# Patient Record
Sex: Female | Born: 1945
Health system: Southern US, Community
[De-identification: ages and names within clinical notes are randomized; demographics above are authoritative.]

## PROBLEM LIST (undated history)

## (undated) DIAGNOSIS — R7303 Prediabetes: Secondary | ICD-10-CM

## (undated) DIAGNOSIS — C779 Secondary and unspecified malignant neoplasm of lymph node, unspecified: Secondary | ICD-10-CM

## (undated) DIAGNOSIS — E559 Vitamin D deficiency, unspecified: Secondary | ICD-10-CM

## (undated) DIAGNOSIS — M8580 Other specified disorders of bone density and structure, unspecified site: Secondary | ICD-10-CM

## (undated) DIAGNOSIS — I10 Essential (primary) hypertension: Secondary | ICD-10-CM

## (undated) DIAGNOSIS — C3490 Malignant neoplasm of unspecified part of unspecified bronchus or lung: Secondary | ICD-10-CM

## (undated) DIAGNOSIS — Z8489 Family history of other specified conditions: Secondary | ICD-10-CM

## (undated) DIAGNOSIS — E785 Hyperlipidemia, unspecified: Secondary | ICD-10-CM

## (undated) DIAGNOSIS — K635 Polyp of colon: Secondary | ICD-10-CM

## (undated) DIAGNOSIS — R0989 Other specified symptoms and signs involving the circulatory and respiratory systems: Secondary | ICD-10-CM

## (undated) DIAGNOSIS — M1990 Unspecified osteoarthritis, unspecified site: Secondary | ICD-10-CM

## (undated) DIAGNOSIS — J449 Chronic obstructive pulmonary disease, unspecified: Secondary | ICD-10-CM

## (undated) DIAGNOSIS — J439 Emphysema, unspecified: Secondary | ICD-10-CM

## (undated) DIAGNOSIS — R079 Chest pain, unspecified: Secondary | ICD-10-CM

## (undated) HISTORY — DX: Other specified symptoms and signs involving the circulatory and respiratory systems: R09.89

## (undated) HISTORY — DX: Prediabetes: R73.03

## (undated) HISTORY — DX: Secondary and unspecified malignant neoplasm of lymph node, unspecified: C77.9

## (undated) HISTORY — DX: Unspecified osteoarthritis, unspecified site: M19.90

## (undated) HISTORY — DX: Vitamin D deficiency, unspecified: E55.9

## (undated) HISTORY — PX: COLONOSCOPY: SHX174

## (undated) HISTORY — PX: HAND SURGERY: SHX662

## (undated) HISTORY — DX: Other specified disorders of bone density and structure, unspecified site: M85.80

## (undated) HISTORY — DX: Essential (primary) hypertension: I10

## (undated) HISTORY — PX: TONSILLECTOMY: SUR1361

## (undated) HISTORY — PX: TRIGGER FINGER RELEASE: SHX641

## (undated) HISTORY — DX: Hyperlipidemia, unspecified: E78.5

## (undated) HISTORY — PX: ESOPHAGOGASTRODUODENOSCOPY: SHX1529

## (undated) HISTORY — DX: Polyp of colon: K63.5

---

## 1993-05-03 HISTORY — PX: OTHER SURGICAL HISTORY: SHX169

## 1998-03-13 ENCOUNTER — Emergency Department (HOSPITAL_COMMUNITY): Admission: EM | Admit: 1998-03-13 | Discharge: 1998-03-13 | Payer: Self-pay | Admitting: Internal Medicine

## 1998-04-17 ENCOUNTER — Emergency Department (HOSPITAL_COMMUNITY): Admission: EM | Admit: 1998-04-17 | Discharge: 1998-04-17 | Payer: Self-pay | Admitting: Emergency Medicine

## 1998-04-26 ENCOUNTER — Emergency Department (HOSPITAL_COMMUNITY): Admission: EM | Admit: 1998-04-26 | Discharge: 1998-04-26 | Payer: Self-pay

## 1998-12-27 ENCOUNTER — Emergency Department (HOSPITAL_COMMUNITY): Admission: EM | Admit: 1998-12-27 | Discharge: 1998-12-27 | Payer: Self-pay | Admitting: Emergency Medicine

## 1999-01-13 ENCOUNTER — Encounter: Admission: RE | Admit: 1999-01-13 | Discharge: 1999-02-05 | Payer: Self-pay | Admitting: *Deleted

## 1999-02-03 ENCOUNTER — Ambulatory Visit (HOSPITAL_BASED_OUTPATIENT_CLINIC_OR_DEPARTMENT_OTHER): Admission: RE | Admit: 1999-02-03 | Discharge: 1999-02-03 | Payer: Self-pay | Admitting: Orthopedic Surgery

## 1999-08-10 ENCOUNTER — Emergency Department (HOSPITAL_COMMUNITY): Admission: EM | Admit: 1999-08-10 | Discharge: 1999-08-10 | Payer: Self-pay | Admitting: *Deleted

## 1999-08-10 ENCOUNTER — Encounter: Payer: Self-pay | Admitting: Emergency Medicine

## 1999-08-25 ENCOUNTER — Ambulatory Visit (HOSPITAL_BASED_OUTPATIENT_CLINIC_OR_DEPARTMENT_OTHER): Admission: RE | Admit: 1999-08-25 | Discharge: 1999-08-25 | Payer: Self-pay | Admitting: Orthopedic Surgery

## 1999-12-02 ENCOUNTER — Encounter: Payer: Self-pay | Admitting: Orthopedic Surgery

## 1999-12-02 ENCOUNTER — Ambulatory Visit (HOSPITAL_COMMUNITY): Admission: RE | Admit: 1999-12-02 | Discharge: 1999-12-02 | Payer: Self-pay | Admitting: Orthopedic Surgery

## 1999-12-23 ENCOUNTER — Encounter: Payer: Self-pay | Admitting: Orthopedic Surgery

## 1999-12-23 ENCOUNTER — Ambulatory Visit (HOSPITAL_COMMUNITY): Admission: RE | Admit: 1999-12-23 | Discharge: 1999-12-23 | Payer: Self-pay | Admitting: Orthopedic Surgery

## 2000-01-07 ENCOUNTER — Ambulatory Visit (HOSPITAL_COMMUNITY): Admission: RE | Admit: 2000-01-07 | Discharge: 2000-01-07 | Payer: Self-pay | Admitting: Orthopedic Surgery

## 2000-01-07 ENCOUNTER — Encounter: Payer: Self-pay | Admitting: Orthopedic Surgery

## 2000-05-03 HISTORY — PX: LAMINECTOMY: SHX219

## 2000-09-07 ENCOUNTER — Ambulatory Visit (HOSPITAL_COMMUNITY): Admission: RE | Admit: 2000-09-07 | Discharge: 2000-09-07 | Payer: Self-pay | Admitting: Internal Medicine

## 2000-09-07 ENCOUNTER — Encounter: Payer: Self-pay | Admitting: Internal Medicine

## 2000-12-13 ENCOUNTER — Encounter: Payer: Self-pay | Admitting: Neurosurgery

## 2000-12-15 ENCOUNTER — Inpatient Hospital Stay (HOSPITAL_COMMUNITY): Admission: RE | Admit: 2000-12-15 | Discharge: 2000-12-16 | Payer: Self-pay | Admitting: Neurosurgery

## 2000-12-15 ENCOUNTER — Encounter: Payer: Self-pay | Admitting: Neurosurgery

## 2001-01-14 ENCOUNTER — Emergency Department (HOSPITAL_COMMUNITY): Admission: EM | Admit: 2001-01-14 | Discharge: 2001-01-14 | Payer: Self-pay | Admitting: *Deleted

## 2001-01-14 ENCOUNTER — Encounter: Payer: Self-pay | Admitting: *Deleted

## 2003-06-09 ENCOUNTER — Emergency Department (HOSPITAL_COMMUNITY): Admission: EM | Admit: 2003-06-09 | Discharge: 2003-06-09 | Payer: Self-pay | Admitting: Emergency Medicine

## 2005-01-07 ENCOUNTER — Ambulatory Visit (HOSPITAL_COMMUNITY): Admission: RE | Admit: 2005-01-07 | Discharge: 2005-01-07 | Payer: Self-pay | Admitting: Internal Medicine

## 2008-04-09 ENCOUNTER — Ambulatory Visit (HOSPITAL_COMMUNITY): Admission: RE | Admit: 2008-04-09 | Discharge: 2008-04-09 | Payer: Self-pay | Admitting: Internal Medicine

## 2010-05-03 HISTORY — PX: NECK SURGERY: SHX720

## 2010-09-09 ENCOUNTER — Other Ambulatory Visit: Payer: Self-pay | Admitting: Neurosurgery

## 2010-09-09 DIAGNOSIS — M542 Cervicalgia: Secondary | ICD-10-CM

## 2010-09-16 ENCOUNTER — Ambulatory Visit
Admission: RE | Admit: 2010-09-16 | Discharge: 2010-09-16 | Disposition: A | Discharge status: Home / Self Care | Payer: Medicare Other | Source: Ambulatory Visit | Attending: Neurosurgery | Admitting: Neurosurgery

## 2010-09-16 DIAGNOSIS — M542 Cervicalgia: Secondary | ICD-10-CM

## 2010-09-18 NOTE — Op Note (Signed)
[place]. [facility]  Patient:    Stacy [name], Stacy [name]                     MRN: [id number] Proc. Date: 12/15/00 Adm. Date:  [id number] Attending:  Barton Fanny                           Operative Report  PREOPERATIVE DIAGNOSIS:  Right L5-S1 extraforaminal disk herniation.  POSTOPERATIVE DIAGNOSIS:  Right L5-S1 extraforaminal disk herniation.  OPERATION PERFORMED:  Right L5-S1 extraforaminal microdiskectomy with microdissection.  SURGEON:  Hewitt Shorts, M.D.  ASSISTANT:  Mena Goes. Franky Macho, M.D.  ANESTHESIA:  General endotracheal.  INDICATIONS FOR PROCEDURE:  The patient is a 65 year old woman who presented with a right L5 radiculopathy who was found to have a right L5-S1 extraforaminal disk herniation.  Decision was made to proceed with elective extraforaminal microdiskectomy.  DESCRIPTION OF PROCEDURE:  The patient was brought to the operating room and placed under general endotracheal anesthesia.  The patient was turned to a prone position.  The lumbar region was prepped with Betadine soap and solution and draped in a sterile fashion.  Localizing x-ray was taken and the L5-S1 level identified.  A midline incision was made over the L5-S1 level and carried down through subcutaneous tissues.  Bipolar cautery and electrocautery were used to maintain hemostasis.  Dissection was carried down to the lumbar fascia which was incised on the right side of midline and the paraspinal muscles were dissected from the spinous process of the lamina in the subperiosteal fashion.  Dissection was carried out laterally.  The L5-S1 interlaminar space was identified and x-ray was taken to confirm the localization.  We then dissected laterally over the facet joints identifying the ala of the sacrum and the L5 transverse process.  Using a Black Max drill and Kerrison punches, the superior aspect of the ala and the lateral aspect of the facet was removed.   Dissection was carried down into the intertransverse space and careful dissection was carried out.  We were able to eventually expose the right L5 nerve root.  We then dissected inferomedial to that and we were able to identify the L5-S1 disk.  The disk herniation was compressing the right L5 nerve and the remaining annular fibers were incised and diskectomy performed using a variety of pituitary rongeurs.  In the end good decompression of the right L5 nerve root was achieved and all loose fragments of disk material were removed.  Once the diskectomy was completed, we instilled 80 mg of Depo-Medrol in and around the nerve root in the dorsal root ganglion and then proceeded with closure.  The deep fascia was closed with interrupted undyed Vicryl sutures and the subcutaneous and subcuticular layer were closed with interrupted inverted 2-0 undyed Vicryl sutures and the skin was reapproximated with Dermabond.  The patient tolerated the procedure well. Estimated blood loss for this procedure was 50 cc.  Sponge, needle and instrument counts were correct.  Following surgery the patient was turned back to supine position, reversed from  anesthetic, extubated and transferred to the recovery room for further care. DD:  12/15/00 TD:  12/15/00 Job: [id number]
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
## 2010-09-18 NOTE — Op Note (Signed)
[place]. [facility]  Patient:    Stacy [name], Stacy [name]                     MRN: [id number] Proc. Date: 08/25/99 Adm. Date:  [id number] Attending:  Sypher, Douglass Rivers CC:         Katy Fitch. Naaman Plummer., M.D. x 2             Marinus Maw, M.D.                           Operative Report  PREOPERATIVE DIAGNOSIS:  Locked right trigger thumb.  POSTOPERATIVE DIAGNOSIS:  Locked right trigger thumb.  OPERATION:  Release of right thumb A1 pulley.  SURGEON:  Katy Fitch. Sypher, Montez Hageman., M.D.  ASSISTANT:  None.  ANESTHESIA:  2% lidocaine metacarpal head level block right thumb, supplemented by IV sedation.  SUPERVISING ANESTHESIOLOGIST:  Dr. Krista Blue.  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  She has had a history of stenosing tenosynovitis in the past.  She developed an acute stenosing tenosynovitis of her right thumb with locking.  This has been unresponsive to splinting and activity modification. She requested primary release rather than injection therapy.  After informed consent, she was brought to the operating room at this time for release of her A1 pulley.  DESCRIPTION OF PROCEDURE:  Stacy [name] was brought to the operating room and  placed in supine position on the operating table.  Following light sedation, the right arm was prepped with Betadine soap and solution and sterilely draped. Then, 2% lidocaine was infiltrated at the metacarpal head level to obtain block to the radial and ulnar proper digital nerves, as well as, the flexor sheath.  When the anesthesia was satisfactory, the arm was exsanguinated with Esmarch bandage and  arterial tourniquet inflated to 220 mmHg.  Procedure commenced with a short transverse incision directly over the A1 pulley.  Subcutaneous tissues were carefully divided revealing the radial proper digital nerve.  This was gently retracted.  The pulley  was carefully isolated with a Freer and split with scalpel and scissors.  The tendon was delivered and had minor inflammatory changes. Direct full active range of motion of the IP joint was recovered.  The wound was then repaired with mattress sutures of 5-0 nylon.  A compressive dressing was applied with Xeroflo, sterile gauze and Coban.  There were no apparent complications.  Ms. Qazi tolerated the surgery and anesthesia well.  She was transferred to the recovery room with stable vital signs. DD:  08/25/99 TD:  08/25/99 Job: [id number]

## 2010-10-07 ENCOUNTER — Inpatient Hospital Stay (HOSPITAL_COMMUNITY)
Admission: RE | Admit: 2010-10-07 | Discharge: 2010-10-08 | DRG: 473 | Disposition: A | Discharge status: Home / Self Care | Payer: Medicare Other | Source: Ambulatory Visit | Attending: Neurosurgery | Admitting: Neurosurgery

## 2010-10-07 ENCOUNTER — Inpatient Hospital Stay (HOSPITAL_COMMUNITY): Payer: Medicare Other

## 2010-10-07 DIAGNOSIS — F411 Generalized anxiety disorder: Secondary | ICD-10-CM | POA: Diagnosis present

## 2010-10-07 DIAGNOSIS — M5000 Cervical disc disorder with myelopathy, unspecified cervical region: Principal | ICD-10-CM | POA: Diagnosis present

## 2010-10-07 DIAGNOSIS — E7800 Pure hypercholesterolemia, unspecified: Secondary | ICD-10-CM | POA: Diagnosis present

## 2010-10-07 DIAGNOSIS — I10 Essential (primary) hypertension: Secondary | ICD-10-CM | POA: Diagnosis present

## 2010-10-07 LAB — BASIC METABOLIC PANEL
BUN: 11 mg/dL (ref 6–23)
CO2: 29 mEq/L (ref 19–32)
Calcium: 10.1 mg/dL (ref 8.4–10.5)
Chloride: 101 mEq/L (ref 96–112)
Creatinine, Ser: 0.86 mg/dL (ref 0.4–1.2)
GFR calc Af Amer: >60 mL/min (ref >60)
GFR calc non Af Amer: >60 mL/min (ref >60)
Glucose, Bld: 125 mg/dL — ABNORMAL HIGH (ref 70–99)
Potassium: 4.5 mEq/L (ref 3.5–5.1)
Sodium: 139 mEq/L (ref 135–145)

## 2010-10-07 LAB — CBC
HCT: 42.4 % (ref 36.0–46.0)
Hemoglobin: 14.7 g/dL (ref 12.0–15.0)
MCH: 33.6 pg (ref 26.0–34.0)
MCHC: 34.7 g/dL (ref 30.0–36.0)
MCV: 96.8 fL (ref 78.0–100.0)
Platelets: 197 10*3/uL (ref 150–400)
RBC: 4.38 MIL/uL (ref 3.87–5.11)
RDW: 12.2 % (ref 11.5–15.5)
WBC: 5.6 10*3/uL (ref 4.0–10.5)

## 2010-10-07 LAB — DIFFERENTIAL
Basophils Absolute: 0 10*3/uL (ref 0.0–0.1)
Basophils Relative: 1 % (ref 0–1)
Eosinophils Absolute: 0.3 10*3/uL (ref 0.0–0.7)
Eosinophils Relative: 5 % (ref 0–5)
Lymphocytes Relative: 37 % (ref 12–46)
Lymphs Abs: 2.1 10*3/uL (ref 0.7–4.0)
Monocytes Absolute: 0.4 10*3/uL (ref 0.1–1.0)
Monocytes Relative: 6 % (ref 3–12)
Neutro Abs: 2.9 10*3/uL (ref 1.7–7.7)
Neutrophils Relative %: 51 % (ref 43–77)

## 2010-10-07 LAB — SURGICAL PCR SCREEN
MRSA, PCR: NEGATIVE
Staphylococcus aureus: NEGATIVE

## 2010-10-14 NOTE — Op Note (Signed)
NAMELYNDZEE, KLIEBERT NO.:  [id number]  MEDICAL RECORD NO.:  [id number]  LOCATION:  [id number]                         FACILITY:  [facility]  PHYSICIAN:  Hewitt Shorts, M.D.DATE OF BIRTH:  09-08-1945  DATE OF PROCEDURE:  10/07/2010 DATE OF DISCHARGE:                              OPERATIVE REPORT   PREOPERATIVE DIAGNOSIS:  T5-6 cervical disk herniation with myelopathy, C5-6 cervical spondylosis with myelopathy, cervical stenosis and cervical radiculopathy.  POSTOPERATIVE DIAGNOSIS:  T5-6 cervical disk herniation with myelopathy, C5-6 cervical spondylosis with myelopathy, cervical stenosis and cervical radiculopathy.  PROCEDURE:  C5-6 anterior cervical decompression and arthrodesis with allograft and tether cervical plating.  SURGEON:  Hewitt Shorts, MD  ASSISTANT:  Stefani Dama, MD.  ANESTHESIA:  General endotracheal.  INDICATIONS:  The patient is a 65 year old woman who presented with neckpain and left upper extremity pain and weakness, particularly in the grip and intrinsics.  X-ray showed multilevel degenerative disease and spondylosis worst at the C5-6 level with large broad-based spondylitic disk herniation at the C5-6 levels, significant spinal cord encroachment.  This increased signal in the spinal cord behind the body of C6.  Decision made to proceed with single-level anterior cervical decompression and arthrodesis.  DESCRIPTION OF PROCEDURE:  The patient was brought to the operating room and placed under general endotracheal anesthesia.  The patient was placed in 10 pounds of holder traction.  Neck was prepped with Betadine soap and solution and draped in a sterile fashion.  A horizontal incision was made on the left side of neck.  The line of the incision was infiltrated with local anesthetic of epinephrine.  Incision was made and carried down through subcutaneous tissue and platysma.  Bipolar was used to maintain hemostasis.  Dissection  was carried down to the platysma and then through an avascular plane leaving the sternocleidomastoid, carotid artery, and jugular vein laterally, and the trachea and esophagus medially.  The ventral aspect of the vertebral column was identified and  localizing x-rays were taken, and the C5-6 intervertebral disk space was identified.  Diskectomy was begun with incision of the annulus, continued with micro-curettes and pituitary rongeurs.  Anterior osteophytic overgrowth was removed using osteophyte removal tool and then the operating microscope was draped and brought into the field for additional navigation, illumination, and visualization.  The remainder of decompression was performed using microdissection and microsurgical technique.  The cartilaginous endplates of the vertebral bodies removed using micro-curettes along with high-speed drill and then posterior osteophytic overgrowth was removed using the high-speed drill along with 2-mm Kerrison punch with a thin footplate.  As this was done, we began to remove the posterior longitudinal ligament as well and removed the disk herniation.  We found significant amount of spondylotic disk herniation causing thecal sac and nerve root compression.  There was osteophytic overgrowth encroaching on the neural foramina, particular to the left side, and this was removed as well.  We were able to decompress the neural foramina bilaterally and the exiting nerve roots within this.  Once the decompression was complete, hemostasis was established use Gelfoam soaked in thrombin.  We measured the height of the intervertebral disk space and selected a 6-mm allograft implant.  It was positioned in the intervertebral disk space and countersunk.  We then selected a 12-mm tether cervical plate.  It was positioned over the fusion construct and secured to the vertebra with 4 x 13-mm screws.  We placed a pair of fixed screws at C5 and a pair of variable screws at  C6.  X-ray at the end showed good positioning of the graft plate and screws.  Wound was irrigated with bacitracin solution, checked for hemostasis which was established and confirmed. We then proceed with closure.  Platysma was closed with interrupted inverted 2-0 Vicryl sutures.  Subcutaneous tissues were closed with 3-0 Vicryl sutures.  Skin was closed with Dermabond.  Procedure was tolerated well.  The estimated blood loss was 25 mL.  Sponge count was correct.  Following surgery, the patient the patient was taken out of traction, to be reversed from the anesthetic, extubated, and transferred to recovery room for further care.     Hewitt Shorts, M.D.     RWN/MEDQ  D:  10/07/2010  T:  10/08/2010  Job:  [id number]  Electronically Signed by Shirlean Kelly M.D. on 10/14/2010 10:43:41 AM

## 2011-06-01 DIAGNOSIS — M4712 Other spondylosis with myelopathy, cervical region: Secondary | ICD-10-CM | POA: Diagnosis not present

## 2011-06-01 DIAGNOSIS — M542 Cervicalgia: Secondary | ICD-10-CM | POA: Diagnosis not present

## 2011-06-01 DIAGNOSIS — M5000 Cervical disc disorder with myelopathy, unspecified cervical region: Secondary | ICD-10-CM | POA: Diagnosis not present

## 2011-06-01 DIAGNOSIS — M5412 Radiculopathy, cervical region: Secondary | ICD-10-CM | POA: Diagnosis not present

## 2011-06-01 DIAGNOSIS — M4802 Spinal stenosis, cervical region: Secondary | ICD-10-CM | POA: Diagnosis not present

## 2011-07-23 DIAGNOSIS — M109 Gout, unspecified: Secondary | ICD-10-CM | POA: Diagnosis not present

## 2011-07-23 DIAGNOSIS — Z1212 Encounter for screening for malignant neoplasm of rectum: Secondary | ICD-10-CM | POA: Diagnosis not present

## 2011-07-23 DIAGNOSIS — E782 Mixed hyperlipidemia: Secondary | ICD-10-CM | POA: Diagnosis not present

## 2011-07-23 DIAGNOSIS — R7309 Other abnormal glucose: Secondary | ICD-10-CM | POA: Diagnosis not present

## 2011-07-23 DIAGNOSIS — E119 Type 2 diabetes mellitus without complications: Secondary | ICD-10-CM | POA: Diagnosis not present

## 2011-07-23 DIAGNOSIS — I10 Essential (primary) hypertension: Secondary | ICD-10-CM | POA: Diagnosis not present

## 2011-07-23 DIAGNOSIS — E559 Vitamin D deficiency, unspecified: Secondary | ICD-10-CM | POA: Diagnosis not present

## 2011-07-23 DIAGNOSIS — Z79899 Other long term (current) drug therapy: Secondary | ICD-10-CM | POA: Diagnosis not present

## 2011-07-29 DIAGNOSIS — N3000 Acute cystitis without hematuria: Secondary | ICD-10-CM | POA: Diagnosis not present

## 2011-10-14 DIAGNOSIS — H1030 Unspecified acute conjunctivitis, unspecified eye: Secondary | ICD-10-CM | POA: Diagnosis not present

## 2012-08-21 DIAGNOSIS — Z23 Encounter for immunization: Secondary | ICD-10-CM | POA: Diagnosis not present

## 2012-08-21 DIAGNOSIS — E782 Mixed hyperlipidemia: Secondary | ICD-10-CM | POA: Diagnosis not present

## 2012-08-21 DIAGNOSIS — R5383 Other fatigue: Secondary | ICD-10-CM | POA: Diagnosis not present

## 2012-08-21 DIAGNOSIS — M79609 Pain in unspecified limb: Secondary | ICD-10-CM | POA: Diagnosis not present

## 2012-08-21 DIAGNOSIS — Z1212 Encounter for screening for malignant neoplasm of rectum: Secondary | ICD-10-CM | POA: Diagnosis not present

## 2012-08-21 DIAGNOSIS — D649 Anemia, unspecified: Secondary | ICD-10-CM | POA: Diagnosis not present

## 2012-08-21 DIAGNOSIS — E538 Deficiency of other specified B group vitamins: Secondary | ICD-10-CM | POA: Diagnosis not present

## 2012-08-21 DIAGNOSIS — R350 Frequency of micturition: Secondary | ICD-10-CM | POA: Diagnosis not present

## 2012-08-21 DIAGNOSIS — R5381 Other malaise: Secondary | ICD-10-CM | POA: Diagnosis not present

## 2012-08-21 DIAGNOSIS — R7309 Other abnormal glucose: Secondary | ICD-10-CM | POA: Diagnosis not present

## 2012-08-21 DIAGNOSIS — I10 Essential (primary) hypertension: Secondary | ICD-10-CM | POA: Diagnosis not present

## 2012-08-23 ENCOUNTER — Other Ambulatory Visit: Payer: Self-pay | Admitting: Internal Medicine

## 2012-08-23 DIAGNOSIS — Z1231 Encounter for screening mammogram for malignant neoplasm of breast: Secondary | ICD-10-CM

## 2012-08-23 DIAGNOSIS — M8580 Other specified disorders of bone density and structure, unspecified site: Secondary | ICD-10-CM

## 2012-08-28 DIAGNOSIS — H2510 Age-related nuclear cataract, unspecified eye: Secondary | ICD-10-CM | POA: Diagnosis not present

## 2012-09-26 ENCOUNTER — Encounter: Payer: Self-pay | Admitting: Internal Medicine

## 2012-09-26 ENCOUNTER — Ambulatory Visit (INDEPENDENT_AMBULATORY_CARE_PROVIDER_SITE_OTHER): Payer: Medicare Other | Admitting: Internal Medicine

## 2012-09-26 VITALS — BP 112/62 | HR 72 | Ht 61.0 in | Wt 133.0 lb

## 2012-09-26 DIAGNOSIS — E785 Hyperlipidemia, unspecified: Secondary | ICD-10-CM

## 2012-09-26 NOTE — Patient Instructions (Addendum)
Your physician has requested that you have a carotid duplex in two weeks. This test is an ultrasound of the carotid arteries in your neck. It looks at blood flow through these arteries that supply the brain with blood. Allow one hour for this exam. There are no restrictions or special instructions.  FASTING LABS IN 2 WEEKS:  Lipids/AST  Your physician wants you to follow-up in: 1 year with Dr. Tenny Craw. You will receive a reminder letter in the mail two months in advance. If you don't receive a letter, please call our office to schedule the follow-up appointment.

## 2012-09-26 NOTE — Progress Notes (Signed)
HPI Patient is a 67 yo with history of hyperlipidemia, HTN and carotid bruit. The patient has no known history of CAD  Denies CP with activity  Does get some chest tightness with emotional stress but says she has had this for years. She has been treated for HTN  BP goes up with stress.   She has been on statins in past for hyperlipidemia.  Has had to stop at times because of muscle aches. Now on lipitor 40 qd  Recent lipids LDL was 201.  She says she was put on Zetia in past and taken off for unknown reasons SHe is active  No signif SOB with activity.  Works as Engineer, civil (consulting) at [employer]. Allergies  Allergen Reactions  . Codeine   . Crestor (Rosuvastatin)   . Fentanyl   . Hydrocodeine (Dihydrocodeine)   . Penicillins   . Percocet (Oxycodone-Acetaminophen)     Current Outpatient Prescriptions  Medication Sig Dispense Refill  . aspirin 81 MG tablet Take 81 mg by mouth daily.      Marland Kitchen atenolol (TENORMIN) 50 MG tablet Take 1/2 tab daily      . atorvastatin (LIPITOR) 80 MG tablet Take 1/2 tab daily      . cholecalciferol (VITAMIN D) 1000 UNITS tablet Take 4,000 Units by mouth daily.      . vitamin C (ASCORBIC ACID) 500 MG tablet Take 500 mg by mouth daily.      . Vitamin E 200 UNITS TABS Take by mouth.       No current facility-administered medications for this visit.    Past Medical History  Diagnosis Date  . Hypertension   . Osteopenia   . Colon polyps     with divertirticulitis  . Hyperlipidemia     Past Surgical History  Procedure Laterality Date  . Neck surgery  2012     c4 -5/6  fushion  . Laminectomy  2002    L 5   . Hand surgery    . Trigger finger release    . Head injury  1995    Family History  Problem Relation Age of Onset  . Hyperlipidemia Mother   . Heart disease Mother     7 way bypass/ value replacement/pacemaker  . Heart attack Mother 81  . Diabetes Mother   . Cancer - Other Mother     lung  . Hypertension Father   . Heart attack Father 65  . Cancer  - Other Father     throat    History   Social History  . Marital Status: Divorced    Spouse Name: N/A    Number of Children: N/A  . Years of Education: N/A   Occupational History  . Not on file.   Social History Main Topics  . Smoking status: Current Every Day Smoker  . Smokeless tobacco: Not on file     Comment: 1/2 pack daily  . Alcohol Use: Not on file  . Drug Use: Not on file  . Sexually Active: Not on file   Other Topics Concern  . Not on file   Social History Narrative   Divorced- 3children /2 girls and 1/boy--RN ( works as an Environmental education officer at [employer])    Review of Systems:  All systems reviewed.  They are negative to the above problem except as previously stated.  Vital Signs: BP 112/62  Pulse 72  Ht 5\' 1"  (1.549 m)  Wt 133 lb (60.328 kg)  BMI 25.14 kg/m2  Physical Exam Patient is in NAD HEENT:  Normocephalic, atraumatic. EOMI, PERRLA.  Neck: JVP is normal.  L carotid bruit.  Lungs: clear to auscultation. No rales no wheezes.  Heart: Regular rate and rhythm. Normal S1, S2. No S3.   No significant murmurs. PMI not displaced.  Abdomen:  Supple, nontender. Normal bowel sounds. No masses. No hepatomegaly.  Extremities:   Good distal pulses throughout. No lower extremity edema.  Musculoskeletal :moving all extremities.  Neuro:   alert and oriented x3.  CN II-XII grossly intact.   Assessment and Plan:  1.  HTN  Good control  Keep on same regimen  2.  HL  Type I familial hyperlipidemia.  Would check lipid panel in a few wks to see response to increase in lipitor from 40 qod to qd. Denies aches  Taking Vit D which may help. 3.  L carotid bruit  Schedule USN of carotid  4.  Tobacco  Counselled on cessation.

## 2012-10-02 ENCOUNTER — Ambulatory Visit: Payer: Medicare Other

## 2012-10-02 ENCOUNTER — Other Ambulatory Visit: Payer: Medicare Other

## 2012-10-09 ENCOUNTER — Encounter (INDEPENDENT_AMBULATORY_CARE_PROVIDER_SITE_OTHER): Payer: Medicare Other

## 2012-10-09 ENCOUNTER — Other Ambulatory Visit (INDEPENDENT_AMBULATORY_CARE_PROVIDER_SITE_OTHER): Payer: Medicare Other

## 2012-10-09 DIAGNOSIS — E785 Hyperlipidemia, unspecified: Secondary | ICD-10-CM | POA: Diagnosis not present

## 2012-10-09 DIAGNOSIS — R0989 Other specified symptoms and signs involving the circulatory and respiratory systems: Secondary | ICD-10-CM | POA: Diagnosis not present

## 2012-10-09 DIAGNOSIS — I6529 Occlusion and stenosis of unspecified carotid artery: Secondary | ICD-10-CM

## 2012-10-09 LAB — LIPID PANEL
HDL: 54.2 mg/dL (ref >39.00)
LDL Cholesterol: 62 mg/dL (ref 0–99)
Total CHOL/HDL Ratio: 3
VLDL: 31.8 mg/dL (ref 0.0–40.0)

## 2012-10-09 LAB — AST: AST: 21 U/L (ref 0–37)

## 2012-10-17 DIAGNOSIS — R51 Headache: Secondary | ICD-10-CM | POA: Diagnosis not present

## 2012-10-17 DIAGNOSIS — E782 Mixed hyperlipidemia: Secondary | ICD-10-CM | POA: Diagnosis not present

## 2012-10-17 DIAGNOSIS — I10 Essential (primary) hypertension: Secondary | ICD-10-CM | POA: Diagnosis not present

## 2012-10-17 DIAGNOSIS — Z79899 Other long term (current) drug therapy: Secondary | ICD-10-CM | POA: Diagnosis not present

## 2012-10-17 DIAGNOSIS — R7309 Other abnormal glucose: Secondary | ICD-10-CM | POA: Diagnosis not present

## 2012-11-18 DIAGNOSIS — T6391XA Toxic effect of contact with unspecified venomous animal, accidental (unintentional), initial encounter: Secondary | ICD-10-CM | POA: Diagnosis not present

## 2012-11-18 DIAGNOSIS — T7840XA Allergy, unspecified, initial encounter: Secondary | ICD-10-CM | POA: Diagnosis not present

## 2012-12-19 DIAGNOSIS — E782 Mixed hyperlipidemia: Secondary | ICD-10-CM | POA: Diagnosis not present

## 2012-12-19 DIAGNOSIS — I10 Essential (primary) hypertension: Secondary | ICD-10-CM | POA: Diagnosis not present

## 2012-12-19 DIAGNOSIS — E559 Vitamin D deficiency, unspecified: Secondary | ICD-10-CM | POA: Diagnosis not present

## 2013-01-03 DIAGNOSIS — G5600 Carpal tunnel syndrome, unspecified upper limb: Secondary | ICD-10-CM | POA: Diagnosis not present

## 2013-01-10 DIAGNOSIS — J0410 Acute tracheitis without obstruction: Secondary | ICD-10-CM | POA: Diagnosis not present

## 2013-01-10 DIAGNOSIS — J45909 Unspecified asthma, uncomplicated: Secondary | ICD-10-CM | POA: Diagnosis not present

## 2013-02-20 DIAGNOSIS — E782 Mixed hyperlipidemia: Secondary | ICD-10-CM | POA: Diagnosis not present

## 2013-02-20 DIAGNOSIS — E559 Vitamin D deficiency, unspecified: Secondary | ICD-10-CM | POA: Diagnosis not present

## 2013-02-20 DIAGNOSIS — Z79899 Other long term (current) drug therapy: Secondary | ICD-10-CM | POA: Diagnosis not present

## 2013-02-20 DIAGNOSIS — I10 Essential (primary) hypertension: Secondary | ICD-10-CM | POA: Diagnosis not present

## 2013-02-20 DIAGNOSIS — R7309 Other abnormal glucose: Secondary | ICD-10-CM | POA: Diagnosis not present

## 2013-03-28 ENCOUNTER — Other Ambulatory Visit: Payer: Self-pay | Admitting: Internal Medicine

## 2013-03-28 DIAGNOSIS — I10 Essential (primary) hypertension: Secondary | ICD-10-CM

## 2013-03-28 MED ORDER — ATENOLOL 50 MG PO TABS: ORAL_TABLET | ORAL | Status: DC

## 2013-05-23 DIAGNOSIS — E785 Hyperlipidemia, unspecified: Secondary | ICD-10-CM

## 2013-05-23 DIAGNOSIS — I10 Essential (primary) hypertension: Secondary | ICD-10-CM | POA: Insufficient documentation

## 2013-05-23 DIAGNOSIS — M1990 Unspecified osteoarthritis, unspecified site: Secondary | ICD-10-CM | POA: Insufficient documentation

## 2013-05-23 DIAGNOSIS — E782 Mixed hyperlipidemia: Secondary | ICD-10-CM | POA: Insufficient documentation

## 2013-05-23 DIAGNOSIS — R7303 Prediabetes: Secondary | ICD-10-CM | POA: Insufficient documentation

## 2013-05-23 DIAGNOSIS — E559 Vitamin D deficiency, unspecified: Secondary | ICD-10-CM | POA: Insufficient documentation

## 2013-05-24 ENCOUNTER — Ambulatory Visit (INDEPENDENT_AMBULATORY_CARE_PROVIDER_SITE_OTHER): Payer: Medicare Other | Admitting: Internal Medicine

## 2013-05-24 ENCOUNTER — Encounter: Payer: Self-pay | Admitting: Internal Medicine

## 2013-05-24 ENCOUNTER — Other Ambulatory Visit: Payer: Self-pay | Admitting: Internal Medicine

## 2013-05-24 VITALS — BP 120/70 | HR 68 | Temp 97.5°F | Resp 16 | Wt 129.6 lb

## 2013-05-24 DIAGNOSIS — E782 Mixed hyperlipidemia: Secondary | ICD-10-CM | POA: Diagnosis not present

## 2013-05-24 DIAGNOSIS — R7309 Other abnormal glucose: Secondary | ICD-10-CM

## 2013-05-24 DIAGNOSIS — Z79899 Other long term (current) drug therapy: Secondary | ICD-10-CM

## 2013-05-24 DIAGNOSIS — I10 Essential (primary) hypertension: Secondary | ICD-10-CM

## 2013-05-24 DIAGNOSIS — E559 Vitamin D deficiency, unspecified: Secondary | ICD-10-CM | POA: Diagnosis not present

## 2013-05-24 LAB — CBC WITH DIFFERENTIAL/PLATELET
Basophils Absolute: 0 10*3/uL (ref 0.0–0.1)
Basophils Relative: 0 % (ref 0–1)
EOS ABS: 0.1 10*3/uL (ref 0.0–0.7)
EOS PCT: 2 % (ref 0–5)
HCT: 42.4 % (ref 36.0–46.0)
HEMOGLOBIN: 14.5 g/dL (ref 12.0–15.0)
LYMPHS ABS: 2.7 10*3/uL (ref 0.7–4.0)
LYMPHS PCT: 34 % (ref 12–46)
MCH: 32.7 pg (ref 26.0–34.0)
MCHC: 34.2 g/dL (ref 30.0–36.0)
MCV: 95.7 fL (ref 78.0–100.0)
MONOS PCT: 6 % (ref 3–12)
Monocytes Absolute: 0.5 10*3/uL (ref 0.1–1.0)
NEUTROS PCT: 58 % (ref 43–77)
Neutro Abs: 4.5 10*3/uL (ref 1.7–7.7)
Platelets: 217 10*3/uL (ref 150–400)
RBC: 4.43 MIL/uL (ref 3.87–5.11)
RDW: 13.4 % (ref 11.5–15.5)
WBC: 7.9 10*3/uL (ref 4.0–10.5)

## 2013-05-24 NOTE — Progress Notes (Signed)
Patient ID: Stacy [name], female   DOB: 1946-02-08, 68 y.o.   MRN: [id number]   This very nice 68 y.o. DWF presents for 3 month follow up with Hypertension, Hyperlipidemia, Pre-Diabetes and Vitamin D Deficiency.    HTN predates since 1995. BP has been controlled at home. Today's BP: 120/70 mmHg . Patient denies any cardiac type chest pain, palpitations, dyspnea/orthopnea/PND, dizziness, claudication, or dependent edema.   Hyperlipidemia is controlled with diet & meds. Last Cholesterol was 258, Triglycerides were 281, HDL 65 and LDL 137 - not controlled on Zetia alone ( Hx/o intolerance to Crestor)). Patient denies myalgias or other med SE's.    Also, the patient has history of PreDiabetes with A1c 6.4 %  Since Feb 2011 with last A1c of 6.2 % in Oct 2014. Patient denies any symptoms of reactive hypoglycemia, diabetic polys, paresthesias or visual blurring.   Further, Patient has history of Vitamin D Deficiency of 7 in 2008 with last vitamin D of 60 in Oct 2014. Patient supplements vitamin D without any suspected side-effects.    Medication List         aspirin 81 MG tablet  Take 81 mg by mouth daily.     atenolol 50 MG tablet  Commonly known as:  TENORMIN  1/2 to 1 tab qd for BP as directed     atorvastatin 80 MG tablet  Commonly known as:  LIPITOR  Take 1/2 tab daily     cholecalciferol 1000 UNITS tablet  Commonly known as:  VITAMIN D  Take 4,000 Units by mouth daily.     vitamin C 500 MG tablet  Commonly known as:  ASCORBIC ACID  Take 500 mg by mouth daily.     Vitamin E 200 UNITS Tabs  Take by mouth.     ZETIA 10 MG tablet  Generic drug:  ezetimibe         Allergies  Allergen Reactions  . Codeine   . Crestor [Rosuvastatin]   . Fentanyl   . Hydrocodeine [Dihydrocodeine]   . Penicillins   . Percocet [Oxycodone-Acetaminophen]     PMHx:   Past Medical History  Diagnosis Date  . Osteopenia   . Colon polyps     with divertirticulitis  . Hypertension   .  Hyperlipidemia   . Vitamin D deficiency   . Prediabetes   . DJD (degenerative joint disease)   . Carotid bruit     Bilateral    FHx:    Reviewed / unchanged  SHx:    Reviewed / unchanged  Systems Review: Constitutional: Denies fever, chills, wt changes, headaches, insomnia, fatigue, night sweats, change in appetite. Eyes: Denies redness, blurred vision, diplopia, discharge, itchy, watery eyes.  ENT: Denies discharge, congestion, post nasal drip, epistaxis, sore throat, earache, hearing loss, dental pain, tinnitus, vertigo, sinus pain, snoring.  CV: Denies chest pain, palpitations, irregular heartbeat, syncope, dyspnea, diaphoresis, orthopnea, PND, claudication, edema. Respiratory: denies cough, dyspnea, DOE, pleurisy, hoarseness, laryngitis, wheezing.  Gastrointestinal: Denies dysphagia, odynophagia, heartburn, reflux, water brash, abdominal pain or cramps, nausea, vomiting, bloating, diarrhea, constipation, hematemesis, melena, hematochezia,  or hemorrhoids. Genitourinary: Denies dysuria, frequency, urgency, nocturia, hesitancy, discharge, hematuria, flank pain. Musculoskeletal: Denies arthralgias, myalgias, stiffness, jt. swelling, pain, limp, strain/sprain.  Skin: Denies pruritus, rash, hives, warts, acne, eczema, change in skin lesion(s). Neuro: No weakness, tremor, incoordination, spasms, paresthesia, or pain. Psychiatric: Denies confusion, memory loss, or sensory loss. Endo: Denies change in weight, skin, hair change.  Heme/Lymph: No excessive bleeding, bruising, orenlarged lymph nodes.  BP: 120/70  Pulse: 68  Temp: 97.5 F (36.4 C)  Resp: 16    Estimated body mass index is 24.5 kg/(m^2) as calculated from the following:   Height as of 09/26/12: 5\' 1"  (1.549 m).   Weight as of this encounter: 129 lb 9.6 oz (58.786 kg).  On Exam:  Appears well nourished - in no distress. Eyes: PERRLA, EOMs, conjunctiva no swelling or erythema. Sinuses: No frontal/maxillary  tenderness ENT/Mouth: EAC's clear, TM's nl w/o erythema, bulging. Nares clear w/o erythema, swelling, exudates. Oropharynx clear without erythema or exudates. Oral hygiene is good. Tongue normal, non obstructing. Hearing intact.  Neck: Supple. Thyroid nl. Car 2+/2+ without bruits, nodes or JVD. Chest: Respirations nl with BS clear & equal w/o rales, rhonchi, wheezing or stridor.  Cor: Heart sounds normal w/ regular rate and rhythm without sig. murmurs, gallops, clicks, or rubs. Peripheral pulses normal and equal  without edema.  Abdomen: Soft & bowel sounds normal. Non-tender w/o guarding, rebound, hernias, masses, or organomegaly.  Lymphatics: Unremarkable.  Musculoskeletal: Full ROM all peripheral extremities, joint stability, 5/5 strength, and normal gait.  Skin: Warm, dry without exposed rashes, lesions, ecchymosis apparent.  Neuro: Cranial nerves intact, reflexes equal bilaterally. Sensory-motor testing grossly intact. Tendon reflexes grossly intact.  Pysch: Alert & oriented x 3. Insight and judgement nl & appropriate. No ideations.  Assessment and Plan:  1. Hypertension - Continue monitor blood pressure at home. Continue diet/meds same.  2. Hyperlipidemia - Continue diet/meds, exercise,& lifestyle modifications. Further disposition pending labs.Continue monitor periodic cholesterol/liver & renal functions   3. Pre-diabetes - Continue diet, exercise, lifestyle modifications. Monitor appropriate labs.  4. Vitamin D Deficiency - Continue supplementation.  Recommended regular exercise, BP monitoring, weight control, and discussed med and SE's. Recommended labs to assess and monitor clinical status. Further disposition pending results of labs. Discouraged her smoking albeit very limited - alleged at 2 cig's/day.

## 2013-05-24 NOTE — Patient Instructions (Signed)

## 2013-05-25 LAB — HEPATIC FUNCTION PANEL
ALBUMIN: 4.4 g/dL (ref 3.5–5.2)
ALK PHOS: 80 U/L (ref 39–117)
ALT: 13 U/L (ref 0–35)
AST: 21 U/L (ref 0–37)
BILIRUBIN INDIRECT: 0.4 mg/dL (ref 0.0–0.9)
BILIRUBIN TOTAL: 0.5 mg/dL (ref 0.3–1.2)
Bilirubin, Direct: 0.1 mg/dL (ref 0.0–0.3)
Total Protein: 7.6 g/dL (ref 6.0–8.3)

## 2013-05-25 LAB — BASIC METABOLIC PANEL WITH GFR
BUN: 9 mg/dL (ref 6–23)
CALCIUM: 9.8 mg/dL (ref 8.4–10.5)
CHLORIDE: 101 meq/L (ref 96–112)
CO2: 28 meq/L (ref 19–32)
CREATININE: 0.74 mg/dL (ref 0.50–1.10)
GFR, Est African American: >89 mL/min
GFR, Est Non African American: 84 mL/min
Glucose, Bld: 102 mg/dL — ABNORMAL HIGH (ref 70–99)
Potassium: 4.3 mEq/L (ref 3.5–5.3)
Sodium: 137 mEq/L (ref 135–145)

## 2013-05-25 LAB — LIPID PANEL
CHOL/HDL RATIO: 3 ratio
Cholesterol: 208 mg/dL — ABNORMAL HIGH (ref 0–200)
HDL: 70 mg/dL (ref >39)
LDL CALC: 101 mg/dL — AB (ref 0–99)
Triglycerides: 184 mg/dL — ABNORMAL HIGH (ref <150)
VLDL: 37 mg/dL (ref 0–40)

## 2013-05-25 LAB — HEMOGLOBIN A1C
HEMOGLOBIN A1C: 6.1 % — AB (ref <5.7)
Mean Plasma Glucose: 128 mg/dL — ABNORMAL HIGH (ref <117)

## 2013-05-25 LAB — MAGNESIUM: MAGNESIUM: 2 mg/dL (ref 1.5–2.5)

## 2013-05-25 LAB — INSULIN, FASTING: Insulin fasting, serum: 18 u[IU]/mL (ref 3–28)

## 2013-05-25 LAB — TSH: TSH: 1.608 u[IU]/mL (ref 0.350–4.500)

## 2013-05-26 LAB — VITAMIN D 25 HYDROXY (VIT D DEFICIENCY, FRACTURES): Vit D, 25-Hydroxy: 48 ng/mL (ref 30–89)

## 2013-08-22 ENCOUNTER — Encounter: Payer: Self-pay | Admitting: Emergency Medicine

## 2013-08-27 ENCOUNTER — Ambulatory Visit: Payer: Self-pay | Admitting: Internal Medicine

## 2013-11-08 ENCOUNTER — Other Ambulatory Visit: Payer: Self-pay | Admitting: Emergency Medicine

## 2013-11-08 ENCOUNTER — Ambulatory Visit (INDEPENDENT_AMBULATORY_CARE_PROVIDER_SITE_OTHER): Payer: Medicare Other | Admitting: Emergency Medicine

## 2013-11-08 ENCOUNTER — Encounter: Payer: Self-pay | Admitting: Emergency Medicine

## 2013-11-08 VITALS — BP 100/58 | HR 70 | Temp 98.2°F | Resp 16 | Ht 61.0 in | Wt 134.0 lb

## 2013-11-08 DIAGNOSIS — E782 Mixed hyperlipidemia: Secondary | ICD-10-CM | POA: Diagnosis not present

## 2013-11-08 DIAGNOSIS — M79643 Pain in unspecified hand: Secondary | ICD-10-CM

## 2013-11-08 DIAGNOSIS — R5383 Other fatigue: Secondary | ICD-10-CM

## 2013-11-08 DIAGNOSIS — E559 Vitamin D deficiency, unspecified: Secondary | ICD-10-CM

## 2013-11-08 DIAGNOSIS — R5381 Other malaise: Secondary | ICD-10-CM

## 2013-11-08 DIAGNOSIS — Z23 Encounter for immunization: Secondary | ICD-10-CM | POA: Diagnosis not present

## 2013-11-08 DIAGNOSIS — R7309 Other abnormal glucose: Secondary | ICD-10-CM

## 2013-11-08 DIAGNOSIS — Z1212 Encounter for screening for malignant neoplasm of rectum: Secondary | ICD-10-CM

## 2013-11-08 DIAGNOSIS — M79609 Pain in unspecified limb: Secondary | ICD-10-CM | POA: Diagnosis not present

## 2013-11-08 DIAGNOSIS — I10 Essential (primary) hypertension: Secondary | ICD-10-CM

## 2013-11-08 DIAGNOSIS — Z789 Other specified health status: Secondary | ICD-10-CM

## 2013-11-08 DIAGNOSIS — M2550 Pain in unspecified joint: Secondary | ICD-10-CM | POA: Diagnosis not present

## 2013-11-08 DIAGNOSIS — Z1331 Encounter for screening for depression: Secondary | ICD-10-CM

## 2013-11-08 DIAGNOSIS — R300 Dysuria: Secondary | ICD-10-CM | POA: Diagnosis not present

## 2013-11-08 DIAGNOSIS — Z1231 Encounter for screening mammogram for malignant neoplasm of breast: Secondary | ICD-10-CM

## 2013-11-08 DIAGNOSIS — Z780 Asymptomatic menopausal state: Secondary | ICD-10-CM

## 2013-11-08 DIAGNOSIS — Z0000 Encounter for general adult medical examination without abnormal findings: Secondary | ICD-10-CM

## 2013-11-08 LAB — CBC WITH DIFFERENTIAL/PLATELET
BASOS PCT: 0 % (ref 0–1)
Basophils Absolute: 0 10*3/uL (ref 0.0–0.1)
EOS ABS: 0.3 10*3/uL (ref 0.0–0.7)
Eosinophils Relative: 4 % (ref 0–5)
HCT: 41.4 % (ref 36.0–46.0)
HEMOGLOBIN: 14.4 g/dL (ref 12.0–15.0)
LYMPHS ABS: 2.8 10*3/uL (ref 0.7–4.0)
Lymphocytes Relative: 36 % (ref 12–46)
MCH: 33.6 pg (ref 26.0–34.0)
MCHC: 34.8 g/dL (ref 30.0–36.0)
MCV: 96.7 fL (ref 78.0–100.0)
Monocytes Absolute: 0.5 10*3/uL (ref 0.1–1.0)
Monocytes Relative: 7 % (ref 3–12)
NEUTROS ABS: 4.1 10*3/uL (ref 1.7–7.7)
NEUTROS PCT: 53 % (ref 43–77)
PLATELETS: 235 10*3/uL (ref 150–400)
RBC: 4.28 MIL/uL (ref 3.87–5.11)
RDW: 13.6 % (ref 11.5–15.5)
WBC: 7.8 10*3/uL (ref 4.0–10.5)

## 2013-11-08 LAB — HEMOGLOBIN A1C
HEMOGLOBIN A1C: 5.7 % — AB (ref <5.7)
MEAN PLASMA GLUCOSE: 117 mg/dL — AB (ref <117)

## 2013-11-08 MED ORDER — HYDROCODONE-ACETAMINOPHEN 5-325 MG PO TABS: ORAL_TABLET | ORAL | Status: DC

## 2013-11-08 MED ORDER — ALPRAZOLAM 0.5 MG PO TABS: 0.5000 mg | ORAL_TABLET | Freq: Two times a day (BID) | ORAL | Status: DC | PRN

## 2013-11-08 NOTE — Progress Notes (Signed)
Patient ID: Stacy [name], female   DOB: [birth year], 68 y.o.   MRN: [id number] MEDICARE ANNUAL WELLNESS VISIT AND CPE  Assessment:  1. CPE/ medicare wellness update- Update screening labs/ History/ Immunizations/ Testing as needed. Advised healthy diet, QD exercise, increase H20 and continue RX/ Vitamins AD.   2.   3 month F/U for HTN, Cholesterol, Pre-Dm, D. Deficient. Needs healthy diet, cardio QD and obtain healthy weight. Check Labs, Check BP if >130/80 call office   3. Myalgia/ Arthralgia/ Fatigue- check labs, increase activity and H2O, advise water exercises  4. DYsuria- check labs, increase H2o, Hygiene explained Plan:   During the course of the visit the patient was educated and counseled about appropriate screening and preventive services including:    Pneumococcal vaccine   Influenza vaccine  Td vaccine  Screening electrocardiogram  Screening mammography  Bone densitometry screening  Colorectal cancer screening  Diabetes screening  Glaucoma screening  Nutrition counseling   Advanced directives: given information/requested  Screening recommendations, referrals:  Vaccinations: Tdap vaccine ordered Influenza vaccine not indicated Pneumococcal vaccine not indicated Shingles vaccine declined Hep B vaccine not indicated  Nutrition assessed and recommended  Colonoscopy declined Mammogram ordered Pap smear not indicated Pelvic exam not indicated Recommended yearly ophthalmology/optometry visit for glaucoma screening and checkup Recommended yearly dental visit for hygiene and checkup Advanced directives - declined  Conditions/risks identified: BMI: Discussed weight loss, diet, and increase physical activity.  Increase physical activity: AHA recommends 150 minutes of physical activity a week.  Medications reviewed DEXA- ordered Diabetes at goal, ACE/ARB therapy No, Reason not on Ace Inhibitor/ARB therapy:  n/a Urinary Incontinence is not an issue:  discussed non pharmacology and pharmacology options.  Fall risk: low- discussed PT, home fall assessment, medications.   Subjective:   Stacy [name] is a 68 y.o. female who presents for Medicare Annual Wellness Visit and complete physical.    Date of last medicare wellness visit is unknown.  She notes increased arthralgias with working in the yard. She notes pain in hands and neck worse. She notes OTC Tylenol/ Tramadol without relief. She notes she can take 1/2 of vicodin and it helps. She has had NEG autoimmune workup at [name] several years ago. She notes occasionally mild decreased sensation in left side of body w/o trigger. She has mild fatigue and notes occasionally dysuria.   She has broken a tooth 1 month ago and been seeing dentist for care. She has seen EYE doctor with early cataracts.   She has had elevated blood pressure since 1995. Her blood pressure has been controlled at home, today their BP is BP: 100/58 mmHg She does workout. She denies chest pain, shortness of breath, dizziness.  She is on cholesterol medication and denies myalgias. Her cholesterol is not at goal. The cholesterol last visit was:   Lab Results  Component Value Date   CHOL 335* 11/08/2013   HDL 64 11/08/2013   LDLCALC 226* 11/08/2013   TRIG 227* 11/08/2013   CHOLHDL 5.2 11/08/2013    She has been working on diet and exercise for prediabetes, and denies polyuria and visual disturbances. Last A1C in the office was:  Lab Results  Component Value Date   HGBA1C 5.7* 11/08/2013   Patient is on Vitamin D supplement.   Lab Results  Component Value Date   VD25OH 57 11/08/2013       Names of Other Physician/Practitioners you currently use: Patient Care Team: Stacy Pinto, MD as PCP - General (Internal Medicine) Stacy [name], OD  as Referring Physician (Optometry) Stacy [name], DDS (Dentistry) Stacy Sickle, MD as Consulting Physician (Orthopedic Surgery) Stacy Pearson, MD as Consulting Physician  (Obstetrics and Gynecology) Stacy Craver, MD as Consulting Physician (Gastroenterology)   Medication Review Current Outpatient Prescriptions on File Prior to Visit  Medication Sig Dispense Refill  . aspirin 81 MG tablet Take 81 mg by mouth daily.      Marland Kitchen atenolol (TENORMIN) 50 MG tablet 1/2 to 1 tab qd for BP as directed  90 tablet  99  . atorvastatin (LIPITOR) 80 MG tablet Take 1/2 tab daily      . cholecalciferol (VITAMIN D) 1000 UNITS tablet Take 4,000 Units by mouth daily.      . vitamin C (ASCORBIC ACID) 500 MG tablet Take 500 mg by mouth daily.      . Vitamin E 200 UNITS TABS Take by mouth.      Marland Kitchen ZETIA 10 MG tablet        No current facility-administered medications on file prior to visit.    Current Problems (verified) Patient Active Problem List   Diagnosis Date Noted  . Unspecified essential hypertension 05/24/2013  . Mixed hyperlipidemia 05/24/2013  . Other abnormal glucose 05/24/2013  . Unspecified vitamin D deficiency 05/24/2013  . Hypertension   . Hyperlipidemia   . Vitamin D deficiency   . Prediabetes   . DJD (degenerative joint disease)   . Carotid bruit     Screening Tests Health Maintenance  Topic Date Due  . Colonoscopy  08/03/1994  . Zostavax  08/02/2004  . Mammogram  04/09/2010  . Influenza Vaccine  12/01/2013  . Tetanus/tdap  11/09/2023  . Pneumococcal Polysaccharide Vaccine Age 59 And Over  Completed    Immunization History  Administered Date(s) Administered  . Pneumococcal Polysaccharide-23 08/21/2012  . Td 05/04/2003  . Tdap 11/08/2013    Preventative care: Last colonoscopy: 2011 Last mammogram: 2009 Last pap smear/pelvic exam: 2012 DEXA:WNL >5 years EYE: 2015 early cataracts Dentist: 10/2013 WNL  Prior vaccinations: TD:2005  Influenza: 2014 at work  Pneumococcal: 2014 Shingles/Zostavax:   Past Medical History  Diagnosis Date  . Osteopenia   . Colon polyps     with divertirticulitis  . Hypertension   . Hyperlipidemia   .  Vitamin D deficiency   . Prediabetes   . DJD (degenerative joint disease)   . Carotid bruit     Bilateral   Past Surgical History  Procedure Laterality Date  . Neck surgery  2012     c4 -5/6  fushion  . Laminectomy  2002    L 5   . Hand surgery    . Trigger finger release    . Head injury  1995   History  Substance Use Topics  . Smoking status: Current Every Day Smoker  . Smokeless tobacco: Not on file     Comment: smokes 2 cigarettes per day  . Alcohol Use: No   Family History  Problem Relation Age of Onset  . Hyperlipidemia Mother   . Heart disease Mother     7 way bypass/ value replacement/pacemaker  . Heart attack Mother 1  . Diabetes Mother   . Cancer - Other Mother     lung  . Hypertension Father   . Heart attack Father 67  . Cancer - Other Father     throat     Risk Factors: Osteoporosis: postmenopausal estrogen deficiency History of fracture in the past year: no  Tobacco History  Substance Use  Topics  . Smoking status: Current Every Day Smoker  . Smokeless tobacco: Not on file     Comment: smokes 2 cigarettes per day  . Alcohol Use: No   She does smoke.  Patient is not a former smoker. Are there smokers in your home (other than you)?  No  Alcohol Current alcohol use: RARE  Caffeine Current caffeine use: coffee 1 /day  Exercise  Current exercise: gardening, housecleaning and walking  Nutrition/Diet Current diet: in general, a "healthy" diet    Cardiac risk factors: advanced age (older than 18 for men, 65 for women), dyslipidemia and hypertension.  Depression Screen (Note: if answer to either of the following is "Yes", a more complete depression screening is indicated)   Q1: Over the past two weeks, have you felt down, depressed or hopeless? No  Q2: Over the past two weeks, have you felt little interest or pleasure in doing things? No  Have you lost interest or pleasure in daily life? No  Do you often feel hopeless? No  Do you cry  easily over simple problems? No  Activities of Daily Living In your present state of health, do you have any difficulty performing the following activities?:  Driving? No Managing money?  No Feeding yourself? No Getting from bed to chair? No Climbing a flight of stairs? No Preparing food and eating?: No Bathing or showering? No Getting dressed: No Getting to the toilet? No Using the toilet:No Moving around from place to place: No In the past year have you fallen or had a near fall?:No   Are you sexually active?  No  Do you have more than one partner?  No  Vision Difficulties: No  Hearing Difficulties: No Do you often ask people to speak up or repeat themselves? No Do you experience ringing or noises in your ears? No Do you have difficulty understanding soft or whispered voices? No  Cognition  Do you feel that you have a problem with memory?No  Do you often misplace items? No  Do you feel safe at home?  Yes  Advanced directives Does patient have a [place]? Yes Does patient have a Living Will? Yes   Objective:     Blood pressure 100/58, pulse 70, temperature 98.2 F (36.8 C), temperature source Temporal, resp. rate 16, height 5\' 1"  (1.549 m), weight 134 lb (60.782 kg). Body mass index is 25.33 kg/(m^2).  General appearance: alert, no distress, WD/WN,  female Cognitive Testing  Alert? Yes  Normal Appearance?Yes  Oriented to person? Yes  Place? Yes   Time? Yes  Recall of three objects?  Yes  Can perform simple calculations? Yes  Displays appropriate judgment?Yes  Can read the correct time from a watch face?Yes  HEENT: normocephalic, sclerae anicteric, TMs pearly, nares patent, no discharge or erythema, pharynx normal Oral cavity: MMM, no lesions Neck: supple, no lymphadenopathy, no thyromegaly, no masses Heart: RRR, normal S1, S2, no murmurs Lungs: CTA bilaterally, no wheezes, rhonchi, or rales Abdomen: +bs, soft, non tender, non distended,  no masses, no hepatomegaly, no splenomegaly Musculoskeletal: nontender, no swelling, no obvious deformity Extremities: no edema, no cyanosis, no clubbing Pulses: 2+ symmetric, upper and lower extremities, normal cap refill Neurological: alert, oriented x 3, CN2-12 intact, strength normal upper extremities and lower extremities, sensation normal throughout except left foot mild decrease, DTRs 2+ throughout, no cerebellar signs, gait normal Psychiatric: normal affect, behavior normal, pleasant  Breast:nontender, no masses or lumps, no skin changes, no nipple discharge or  inversion, no axillary lymphadenopathy Gyn: defer  Rectal: defer  AORTA SCAN WNL EKG NSCSPT  Medicare Attestation I have personally reviewed: The patient's medical and social history Their use of alcohol, tobacco or illicit drugs Their current medications and supplements The patient's functional ability including ADLs,fall risks, home safety risks, cognitive, and hearing and visual impairment Diet and physical activities Evidence for depression or mood disorders  The patient's weight, height, BMI, and visual acuity have been recorded in the chart.  I have made referrals, counseling, and provided education to the patient based on review of the above and I have provided the patient with a written personalized care plan for preventive services.     Kelby Aline, R, PA-C   11/12/2013

## 2013-11-08 NOTE — Patient Instructions (Signed)
Tetanus, Diphtheria, Pertussis (Tdap) Vaccine  What You Need to Know  WHY GET VACCINATED?  Tetanus, diphtheria and pertussis can be very serious diseases, even for adolescents and adults. Tdap vaccine can protect us from these diseases.  TETANUS (Lockjaw) causes painful muscle tightening and stiffness, usually all over the body.  · It can lead to tightening of muscles in the head and neck so you can't open your mouth, swallow, or sometimes even breathe. Tetanus kills about 1 out of 5 people who are infected.  DIPHTHERIA can cause a thick coating to form in the back of the throat.  · It can lead to breathing problems, paralysis, heart failure, and death.  PERTUSSIS (Whooping Cough) causes severe coughing spells, which can cause difficulty breathing, vomiting and disturbed sleep.  · It can also lead to weight loss, incontinence, and rib fractures. Up to 2 in 100 adolescents and 5 in 100 adults with pertussis are hospitalized or have complications, which could include pneumonia and death.  These diseases are caused by bacteria. Diphtheria and pertussis are spread from person to person through coughing or sneezing. Tetanus enters the body through cuts, scratches, or wounds.  Before vaccines, the [place] saw as many as 200,000 cases a year of diphtheria and pertussis, and hundreds of cases of tetanus. Since vaccination began, tetanus and diphtheria have dropped by about 99% and pertussis by about 80%.  TDAP VACCINE  Tdap vaccine can protect adolescents and adults from tetanus, diphtheria, and pertussis. One dose of Tdap is routinely given at age 11 or 12. People who did not get Tdap at that age should get it as soon as possible.  Tdap is especially important for health care professionals and anyone having close contact with a baby younger than 12 months.  Pregnant women should get a dose of Tdap during every pregnancy, to protect the newborn from pertussis. Infants are most at risk for severe, life-threatening  complications from pertussis.  A similar vaccine, called Td, protects from tetanus and diphtheria, but not pertussis. A Td booster should be given every 10 years. Tdap may be given as one of these boosters if you have not already gotten a dose. Tdap may also be given after a severe cut or burn to prevent tetanus infection.  Your doctor can give you more information.  Tdap may safely be given at the same time as other vaccines.  SOME PEOPLE SHOULD NOT GET THIS VACCINE  · If you ever had a life-threatening allergic reaction after a dose of any tetanus, diphtheria, or pertussis containing vaccine, OR if you have a severe allergy to any part of this vaccine, you should not get Tdap. Tell your doctor if you have any severe allergies.  · If you had a coma, or long or multiple seizures within 7 days after a childhood dose of DTP or DTaP, you should not get Tdap, unless a cause other than the vaccine was found. You can still get Td.  · Talk to your doctor if you:  ¨ have epilepsy or another nervous system problem,  ¨ had severe pain or swelling after any vaccine containing diphtheria, tetanus or pertussis,  ¨ ever had Guillain-Barré Syndrome (GBS),  ¨ aren't feeling well on the day the shot is scheduled.  RISKS OF A VACCINE REACTION  With any medicine, including vaccines, there is a chance of side effects. These are usually mild and go away on their own, but serious reactions are also possible.  Brief fainting spells can follow   a vaccination, leading to injuries from falling. Sitting or lying down for about 15 minutes can help prevent these. Tell your doctor if you feel dizzy or light-headed, or have vision changes or ringing in the ears.  Mild problems following Tdap (Did not interfere with activities)  · Pain where the shot was given (about 3 in 4 adolescents or 2 in 3 adults)  · Redness or swelling where the shot was given (about 1 person in 5)  · Mild fever of at least 100.4°F (up to about 1 in 25 adolescents or 1 in  100 adults)  · Headache (about 3 or 4 people in 10)  · Tiredness (about 1 person in 3 or 4)  · Nausea, vomiting, diarrhea, stomach ache (up to 1 in 4 adolescents or 1 in 10 adults)  · Chills, body aches, sore joints, rash, swollen glands (uncommon)  Moderate problems following Tdap (Interfered with activities, but did not require medical attention)  · Pain where the shot was given (about 1 in 5 adolescents or 1 in 100 adults)  · Redness or swelling where the shot was given (up to about 1 in 16 adolescents or 1 in 25 adults)  · Fever over 102°F (about 1 in 100 adolescents or 1 in 250 adults)  · Headache (about 3 in 20 adolescents or 1 in 10 adults)  · Nausea, vomiting, diarrhea, stomach ache (up to 1 or 3 people in 100)  · Swelling of the entire arm where the shot was given (up to about 3 in 100).  Severe problems following Tdap (Unable to perform usual activities, required medical attention)  · Swelling, severe pain, bleeding and redness in the arm where the shot was given (rare).  A severe allergic reaction could occur after any vaccine (estimated less than 1 in a million doses).  WHAT IF THERE IS A SERIOUS REACTION?  What should I look for?  · Look for anything that concerns you, such as signs of a severe allergic reaction, very high fever, or behavior changes.  Signs of a severe allergic reaction can include hives, swelling of the face and throat, difficulty breathing, a fast heartbeat, dizziness, and weakness. These would start a few minutes to a few hours after the vaccination.  What should I do?  · If you think it is a severe allergic reaction or other emergency that can't wait, call 9-1-1 or get the person to the nearest hospital. Otherwise, call your doctor.  · Afterward, the reaction should be reported to the "Vaccine Adverse Event Reporting System" (VAERS). Your doctor might file this report, or you can do it yourself through the VAERS web site at www.vaers.hhs.gov, or by calling 1-800-822-7967.  VAERS is  only for reporting reactions. They do not give medical advice.   THE NATIONAL VACCINE INJURY COMPENSATION PROGRAM  The National Vaccine Injury Compensation Program (VICP) is a federal program that was created to compensate people who may have been injured by certain vaccines.  Persons who believe they may have been injured by a vaccine can learn about the program and about filing a claim by calling 1-800-338-2382 or visiting the VICP website at www.hrsa.gov/vaccinecompensation.  HOW CAN I LEARN MORE?  · Ask your doctor.  · Call your local or state health department.  · Contact the Centers for Disease Control and Prevention (CDC):  ¨ Call 1-800-232-4636 or visit CDC's website at www.cdc.gov/vaccines.  CDC Tdap Vaccine VIS (09/09/11)  Document Released: 10/19/2011 Document Revised: 08/14/2012 Document Reviewed: 08/09/2012  Exit[employer]®   Patient Information ©2015 [employer]. This information is not intended to replace advice given to you by your health care provider. Make sure you discuss any questions you have with your health care provider.

## 2013-11-09 LAB — BASIC METABOLIC PANEL WITH GFR
BUN: 16 mg/dL (ref 6–23)
CALCIUM: 9.7 mg/dL (ref 8.4–10.5)
CHLORIDE: 103 meq/L (ref 96–112)
CO2: 26 meq/L (ref 19–32)
Creat: 0.77 mg/dL (ref 0.50–1.10)
GFR, Est African American: >89 mL/min
GFR, Est Non African American: 79 mL/min
GLUCOSE: 98 mg/dL (ref 70–99)
POTASSIUM: 4.2 meq/L (ref 3.5–5.3)
SODIUM: 136 meq/L (ref 135–145)

## 2013-11-09 LAB — URINALYSIS, ROUTINE W REFLEX MICROSCOPIC
BILIRUBIN URINE: NEGATIVE
GLUCOSE, UA: NEGATIVE mg/dL
HGB URINE DIPSTICK: NEGATIVE
KETONES UR: NEGATIVE mg/dL
Nitrite: NEGATIVE
PH: 5 (ref 5.0–8.0)
Protein, ur: NEGATIVE mg/dL
SPECIFIC GRAVITY, URINE: 1.024 (ref 1.005–1.030)
Urobilinogen, UA: 0.2 mg/dL (ref 0.0–1.0)

## 2013-11-09 LAB — HEPATIC FUNCTION PANEL
ALBUMIN: 4.3 g/dL (ref 3.5–5.2)
ALK PHOS: 76 U/L (ref 39–117)
ALT: 11 U/L (ref 0–35)
AST: 19 U/L (ref 0–37)
BILIRUBIN INDIRECT: 0.3 mg/dL (ref 0.2–1.2)
Bilirubin, Direct: 0.1 mg/dL (ref 0.0–0.3)
TOTAL PROTEIN: 7.5 g/dL (ref 6.0–8.3)
Total Bilirubin: 0.4 mg/dL (ref 0.2–1.2)

## 2013-11-09 LAB — RHEUMATOID FACTOR

## 2013-11-09 LAB — CYCLIC CITRUL PEPTIDE ANTIBODY, IGG

## 2013-11-09 LAB — MICROALBUMIN / CREATININE URINE RATIO
CREATININE, URINE: 183.5 mg/dL
Microalb Creat Ratio: 14.2 mg/g (ref 0.0–30.0)
Microalb, Ur: 2.61 mg/dL — ABNORMAL HIGH (ref 0.00–1.89)

## 2013-11-09 LAB — URINALYSIS, MICROSCOPIC ONLY
BACTERIA UA: NONE SEEN
Crystals: NONE SEEN
SQUAMOUS EPITHELIAL / LPF: NONE SEEN

## 2013-11-09 LAB — TSH: TSH: 2.234 u[IU]/mL (ref 0.350–4.500)

## 2013-11-09 LAB — MAGNESIUM: Magnesium: 2.1 mg/dL (ref 1.5–2.5)

## 2013-11-09 LAB — ANTI-DNA ANTIBODY, DOUBLE-STRANDED: ds DNA Ab: <1 IU/mL

## 2013-11-09 LAB — LIPID PANEL
Cholesterol: 335 mg/dL — ABNORMAL HIGH (ref 0–200)
HDL: 64 mg/dL (ref >39)
LDL CALC: 226 mg/dL — AB (ref 0–99)
Total CHOL/HDL Ratio: 5.2 Ratio
Triglycerides: 227 mg/dL — ABNORMAL HIGH (ref <150)
VLDL: 45 mg/dL — ABNORMAL HIGH (ref 0–40)

## 2013-11-09 LAB — ANA: ANA: NEGATIVE

## 2013-11-09 LAB — INSULIN, FASTING: INSULIN FASTING, SERUM: 21 u[IU]/mL (ref 3–28)

## 2013-11-09 LAB — URIC ACID: URIC ACID, SERUM: 4.9 mg/dL (ref 2.4–7.0)

## 2013-11-09 LAB — CK: Total CK: 61 U/L (ref 7–177)

## 2013-11-10 LAB — ALDOLASE: Aldolase: 3.8 U/L (ref <=8.1)

## 2013-11-10 LAB — URINE CULTURE
Colony Count: NO GROWTH
Organism ID, Bacteria: NO GROWTH

## 2013-11-10 LAB — VITAMIN D 25 HYDROXY (VIT D DEFICIENCY, FRACTURES): Vit D, 25-Hydroxy: 57 ng/mL (ref 30–89)

## 2013-11-19 ENCOUNTER — Encounter: Payer: Self-pay | Admitting: *Deleted

## 2013-11-26 ENCOUNTER — Ambulatory Visit: Payer: Self-pay | Admitting: Internal Medicine

## 2013-11-29 ENCOUNTER — Ambulatory Visit (INDEPENDENT_AMBULATORY_CARE_PROVIDER_SITE_OTHER): Payer: Medicare Other | Admitting: Physician Assistant

## 2013-11-29 ENCOUNTER — Encounter: Payer: Self-pay | Admitting: Physician Assistant

## 2013-11-29 VITALS — BP 128/78 | HR 76 | Temp 98.1°F | Resp 16 | Ht 61.0 in | Wt 137.0 lb

## 2013-11-29 DIAGNOSIS — M542 Cervicalgia: Secondary | ICD-10-CM | POA: Diagnosis not present

## 2013-11-29 DIAGNOSIS — M545 Low back pain, unspecified: Secondary | ICD-10-CM | POA: Diagnosis not present

## 2013-11-29 NOTE — Progress Notes (Signed)
   Subjective:    Patient ID: Stacy [name], female    DOB: April 04, 1946, 68 y.o.   MRN: [id number]  HPI 68 y.o. female with history of cervical DDD s/p fusion with Dr. Jerene Bears 2 years ago presents with lower back and shoulder pain. She has restrictions at work but last night she had to do some bending at work due to a shortage and states that this has exacerbated her pain. She has constant left arm numbness/tingling since the surgery that is unchanged. She has taken ibuprofen and aleve at home which has helped.   Review of Systems  Constitutional: Negative.   HENT: Negative.   Respiratory: Negative.   Cardiovascular: Negative.   Gastrointestinal: Negative.   Genitourinary: Negative.   Musculoskeletal: Positive for back pain, myalgias and neck pain. Negative for arthralgias, gait problem, joint swelling and neck stiffness.  Neurological: Negative.   Psychiatric/Behavioral: Negative.        Objective:   Physical Exam  Constitutional: She is oriented to person, place, and time. She appears well-developed and well-nourished.  HENT:  Head: Normocephalic and atraumatic.  Eyes: Conjunctivae are normal. Pupils are equal, round, and reactive to light.  Neck: Normal range of motion. Neck supple.  Cardiovascular: Normal rate and regular rhythm.   Pulmonary/Chest: Effort normal and breath sounds normal.  Musculoskeletal:  Full ROM from side to side but decreased flexion/extension due to pain, +  paraspinal muscle normal sensation, reflexes, and pulses distal. Patient is able to ambulate well.  Gait is  antalgic. Straight leg raising questionable positive left leg for radicular symptoms. Sensory exam in the legs is normal.  Knee reflexes are abnormal  Left side Ankle reflexes are normal Strength is normal and symmetric. There isparaspinal muscle spasm.  There is not midline tenderness.  ROM of spine with normal flexion, extension, lateral range of motion to the right and left, and rotation  to the right and left.   Lymphadenopathy:    She has cervical adenopathy.  Neurological: She is alert and oriented to person, place, and time.      Assessment & Plan:  Neck pain/lower back pain,  Positive straight leg, no bowel/bladder problems Aleve, RICE, and exercise given, can continue Norco at home  If not better with refer to orthopedics. Note restriction for work

## 2013-11-29 NOTE — Patient Instructions (Signed)
Back Exercises These exercises may help you when beginning to rehabilitate your injury. Your symptoms may resolve with or without further involvement from your physician, physical therapist or athletic trainer. While completing these exercises, remember:   Restoring tissue flexibility helps normal motion to return to the joints. This allows healthier, less painful movement and activity.  An effective stretch should be held for at least 30 seconds.  A stretch should never be painful. You should only feel a gentle lengthening or release in the stretched tissue. STRETCH - Extension, Prone on Elbows   Lie on your stomach on the floor, a bed will be too soft. Place your palms about shoulder width apart and at the height of your head.  Place your elbows under your shoulders. If this is too painful, stack pillows under your chest.  Allow your body to relax so that your hips drop lower and make contact more completely with the floor.  Hold this position for __________ seconds.  Slowly return to lying flat on the floor. Repeat __________ times. Complete this exercise __________ times per day.  RANGE OF MOTION - Extension, Prone Press Ups   Lie on your stomach on the floor, a bed will be too soft. Place your palms about shoulder width apart and at the height of your head.  Keeping your back as relaxed as possible, slowly straighten your elbows while keeping your hips on the floor. You may adjust the placement of your hands to maximize your comfort. As you gain motion, your hands will come more underneath your shoulders.  Hold this position __________ seconds.  Slowly return to lying flat on the floor. Repeat __________ times. Complete this exercise __________ times per day.  RANGE OF MOTION- Quadruped, Neutral Spine   Assume a hands and knees position on a firm surface. Keep your hands under your shoulders and your knees under your hips. You may place padding under your knees for  comfort.  Drop your head and point your tail bone toward the ground below you. This will round out your low back like an angry cat. Hold this position for __________ seconds.  Slowly lift your head and release your tail bone so that your back sags into a large arch, like an old horse.  Hold this position for __________ seconds.  Repeat this until you feel limber in your low back.  Now, find your "sweet spot." This will be the most comfortable position somewhere between the two previous positions. This is your neutral spine. Once you have found this position, tense your stomach muscles to support your low back.  Hold this position for __________ seconds. Repeat __________ times. Complete this exercise __________ times per day.  STRETCH - Flexion, Single Knee to Chest   Lie on a firm bed or floor with both legs extended in front of you.  Keeping one leg in contact with the floor, bring your opposite knee to your chest. Hold your leg in place by either grabbing behind your thigh or at your knee.  Pull until you feel a gentle stretch in your low back. Hold __________ seconds.  Slowly release your grasp and repeat the exercise with the opposite side. Repeat __________ times. Complete this exercise __________ times per day.  STRETCH - Hamstrings, Standing  Stand or sit and extend your right / left leg, placing your foot on a chair or foot stool  Keeping a slight arch in your low back and your hips straight forward.  Lead with your chest and   lean forward at the waist until you feel a gentle stretch in the back of your right / left knee or thigh. (When done correctly, this exercise requires leaning only a small distance.)  Hold this position for __________ seconds. Repeat __________ times. Complete this stretch __________ times per day. STRENGTHENING - Deep Abdominals, Pelvic Tilt   Lie on a firm bed or floor. Keeping your legs in front of you, bend your knees so they are both pointed  toward the ceiling and your feet are flat on the floor.  Tense your lower abdominal muscles to press your low back into the floor. This motion will rotate your pelvis so that your tail bone is scooping upwards rather than pointing at your feet or into the floor.  With a gentle tension and even breathing, hold this position for __________ seconds. Repeat __________ times. Complete this exercise __________ times per day.  STRENGTHENING - Abdominals, Crunches   Lie on a firm bed or floor. Keeping your legs in front of you, bend your knees so they are both pointed toward the ceiling and your feet are flat on the floor. Cross your arms over your chest.  Slightly tip your chin down without bending your neck.  Tense your abdominals and slowly lift your trunk high enough to just clear your shoulder blades. Lifting higher can put excessive stress on the low back and does not further strengthen your abdominal muscles.  Control your return to the starting position. Repeat __________ times. Complete this exercise __________ times per day.  STRENGTHENING - Quadruped, Opposite UE/LE Lift   Assume a hands and knees position on a firm surface. Keep your hands under your shoulders and your knees under your hips. You may place padding under your knees for comfort.  Find your neutral spine and gently tense your abdominal muscles so that you can maintain this position. Your shoulders and hips should form a rectangle that is parallel with the floor and is not twisted.  Keeping your trunk steady, lift your right hand no higher than your shoulder and then your left leg no higher than your hip. Make sure you are not holding your breath. Hold this position __________ seconds.  Continuing to keep your abdominal muscles tense and your back steady, slowly return to your starting position. Repeat with the opposite arm and leg. Repeat __________ times. Complete this exercise __________ times per day. Document Released:  05/07/2005 Document Revised: 07/12/2011 Document Reviewed: 08/01/2008 [employer] Patient Information 2015 [employer]. This information is not intended to replace advice given to you by your health care provider. Make sure you discuss any questions you have with your health care provider.  

## 2013-12-20 ENCOUNTER — Ambulatory Visit (HOSPITAL_COMMUNITY)
Admission: RE | Admit: 2013-12-20 | Discharge: 2013-12-20 | Disposition: A | Discharge status: Home / Self Care | Payer: Medicare Other | Source: Ambulatory Visit | Attending: Emergency Medicine | Admitting: Emergency Medicine

## 2013-12-20 ENCOUNTER — Ambulatory Visit (HOSPITAL_COMMUNITY): Payer: Medicare Other

## 2013-12-20 DIAGNOSIS — Z1231 Encounter for screening mammogram for malignant neoplasm of breast: Secondary | ICD-10-CM

## 2013-12-20 DIAGNOSIS — Z13820 Encounter for screening for osteoporosis: Secondary | ICD-10-CM | POA: Insufficient documentation

## 2013-12-20 DIAGNOSIS — Z780 Asymptomatic menopausal state: Secondary | ICD-10-CM

## 2013-12-20 DIAGNOSIS — M899 Disorder of bone, unspecified: Secondary | ICD-10-CM | POA: Diagnosis not present

## 2013-12-20 DIAGNOSIS — M949 Disorder of cartilage, unspecified: Secondary | ICD-10-CM | POA: Diagnosis not present

## 2013-12-26 ENCOUNTER — Telehealth: Payer: Self-pay | Admitting: *Deleted

## 2013-12-26 NOTE — Telephone Encounter (Signed)
Patient aware of bone density results and will continue Vitamin D and Calcium 600 mg daily.

## 2014-02-26 ENCOUNTER — Ambulatory Visit (INDEPENDENT_AMBULATORY_CARE_PROVIDER_SITE_OTHER): Payer: Medicare Other | Admitting: Physician Assistant

## 2014-02-26 ENCOUNTER — Encounter: Payer: Self-pay | Admitting: Physician Assistant

## 2014-02-26 VITALS — BP 120/70 | HR 60 | Temp 97.7°F | Resp 16 | Ht 61.0 in | Wt 138.0 lb

## 2014-02-26 DIAGNOSIS — E785 Hyperlipidemia, unspecified: Secondary | ICD-10-CM

## 2014-02-26 DIAGNOSIS — E559 Vitamin D deficiency, unspecified: Secondary | ICD-10-CM | POA: Diagnosis not present

## 2014-02-26 DIAGNOSIS — I10 Essential (primary) hypertension: Secondary | ICD-10-CM | POA: Diagnosis not present

## 2014-02-26 DIAGNOSIS — Z79899 Other long term (current) drug therapy: Secondary | ICD-10-CM

## 2014-02-26 DIAGNOSIS — R7309 Other abnormal glucose: Secondary | ICD-10-CM | POA: Diagnosis not present

## 2014-02-26 DIAGNOSIS — R7303 Prediabetes: Secondary | ICD-10-CM

## 2014-02-26 LAB — CBC WITH DIFFERENTIAL/PLATELET
BASOS ABS: 0.1 10*3/uL (ref 0.0–0.1)
Basophils Relative: 1 % (ref 0–1)
Eosinophils Absolute: 0.3 10*3/uL (ref 0.0–0.7)
Eosinophils Relative: 4 % (ref 0–5)
HCT: 42.4 % (ref 36.0–46.0)
Hemoglobin: 14.5 g/dL (ref 12.0–15.0)
LYMPHS PCT: 37 % (ref 12–46)
Lymphs Abs: 2.5 10*3/uL (ref 0.7–4.0)
MCH: 33.3 pg (ref 26.0–34.0)
MCHC: 34.2 g/dL (ref 30.0–36.0)
MCV: 97.2 fL (ref 78.0–100.0)
Monocytes Absolute: 0.5 10*3/uL (ref 0.1–1.0)
Monocytes Relative: 8 % (ref 3–12)
NEUTROS ABS: 3.4 10*3/uL (ref 1.7–7.7)
Neutrophils Relative %: 50 % (ref 43–77)
PLATELETS: 227 10*3/uL (ref 150–400)
RBC: 4.36 MIL/uL (ref 3.87–5.11)
RDW: 13.2 % (ref 11.5–15.5)
WBC: 6.7 10*3/uL (ref 4.0–10.5)

## 2014-02-26 LAB — HEMOGLOBIN A1C
Hgb A1c MFr Bld: 6 % — ABNORMAL HIGH (ref <5.7)
Mean Plasma Glucose: 126 mg/dL — ABNORMAL HIGH (ref <117)

## 2014-02-26 NOTE — Patient Instructions (Signed)
Benefiber is good for constipation/diarrhea/irritable bowel syndrome, it helps with weight loss and can help lower your bad cholesterol. Please do 1-2 TBSP in the morning in water, coffee, or tea. It can take up to a month before you can see a difference with your bowel movements. It is cheapest from [employer], [employer], [employer].  Your LDL is not in range. Your LDL is the bad cholesterol that can lead to heart attack and stroke. To lower your number you can decrease your fatty foods, red meat, cheese, milk and increase fiber like whole grains and veggies. You can also add a fiber supplement like Metamucil or Benefiber.   We are giving you chantix for smoking cessation. You can do it! And we are here to help! You may have heard some scary side effects about chantix, the three most common I hear about are nausea, crazy dreams and depression.  However, I like for my patients to try to stay on 1/2 a tablet twice a day rather than one tablet twice a day as normally prescribed. This helps decrease the chances of side effects and helps save money by making a one month prescription last two months  Please start the prescription this way:  Start 1/2 tablet by mouth once daily after food with a full glass of water for 3 days Then do 1/2 tablet by mouth twice daily for 4 days.  At this point we have several options: 1) continue on 1/2 tablet twice a day- which I encourage you to do. You can stay on this dose the rest of the time on the medication or if you still feel the need to smoke you can do one of the two options below. 2) do one tablet in the morning and 1/2 in the evening which helps decrease dreams. 3) do one tablet twice a day.   What if I miss a dose? If you miss a dose, take it as soon as you can. If it is almost time for your next dose, take only that dose. Do not take double or extra doses.  What should I watch for while using this medicine? Visit your doctor or health care professional for regular  check ups. Ask for ongoing advice and encouragement from your doctor or healthcare professional, friends, and family to help you quit. If you smoke while on this medication, quit again  Your mouth may get dry. Chewing sugarless gum or hard candy, and drinking plenty of water may help. Contact your doctor if the problem does not go away or is severe.  You may get drowsy or dizzy. Do not drive, use machinery, or do anything that needs mental alertness until you know how this medicine affects you. Do not stand or sit up quickly, especially if you are an older patient.   The use of this medicine may increase the chance of suicidal thoughts or actions. Pay special attention to how you are responding while on this medicine. Any worsening of mood, or thoughts of suicide or dying should be reported to your health care professional right away.  ADVANTAGES OF QUITTING SMOKING  Within 20 minutes, blood pressure decreases. Your pulse is at normal level.  After 8 hours, carbon monoxide levels in the blood return to normal. Your oxygen level increases.  After 24 hours, the chance of having a heart attack starts to decrease. Your breath, hair, and body stop smelling like smoke.  After 48 hours, damaged nerve endings begin to recover. Your sense of taste and smell  improve.  After 72 hours, the body is virtually free of nicotine. Your bronchial tubes relax and breathing becomes easier.  After 2 to 12 weeks, lungs can hold more air. Exercise becomes easier and circulation improves.  After 1 year, the risk of coronary heart disease is cut in half.  After 5 years, the risk of stroke falls to the same as a nonsmoker.  After 10 years, the risk of lung cancer is cut in half and the risk of other cancers decreases significantly.  After 15 years, the risk of coronary heart disease drops, usually to the level of a nonsmoker.  You will have extra money to spend on things other than cigarettes.

## 2014-02-26 NOTE — Progress Notes (Signed)
Assessment and Plan:  Hypertension: Continue medication, monitor blood pressure at home. Continue DASH diet.  Reminder to go to the ER if any CP, SOB, nausea, dizziness, severe HA, changes vision/speech, left arm numbness and tingling, and jaw pain. Cholesterol: Continue diet and exercise. Check cholesterol.  Pre-diabetes-Continue diet and exercise. Check A1C Vitamin D Def- check level and continue medications.  Smoking cessation- discussed with patient, understands risk of MI, stroke, cancer, and death.    Continue diet and meds as discussed. Further disposition pending results of labs.  HPI 68 y.o. female  presents for 3 month follow up with hypertension, hyperlipidemia, prediabetes and vitamin D. Her blood pressure has been controlled at home, today their BP is BP: 120/70 mmHg She does workout. She denies chest pain, shortness of breath, dizziness.  She is on cholesterol medication, lipitor 80 1/2 daily and denies myalgias. Her cholesterol is not at goal. The cholesterol last visit was:   Lab Results  Component Value Date   CHOL 335* 11/08/2013   HDL 64 11/08/2013   LDLCALC 226* 11/08/2013   TRIG 227* 11/08/2013   CHOLHDL 5.2 11/08/2013   She has been working on diet and exercise for prediabetes, and denies paresthesia of the feet, polydipsia, polyuria and visual disturbances. Last A1C in the office was:  Lab Results  Component Value Date   HGBA1C 5.7* 11/08/2013   Patient is on Vitamin D supplement.   Lab Results  Component Value Date   VD25OH 57 11/08/2013     Current every day smoker.   Current Medications:  Current Outpatient Prescriptions on File Prior to Visit  Medication Sig Dispense Refill  . ALPRAZolam (XANAX) 0.5 MG tablet Take 1 tablet (0.5 mg total) by mouth 2 (two) times daily as needed for anxiety or sleep.  60 tablet  0  . aspirin 81 MG tablet Take 81 mg by mouth daily.      Marland Kitchen atenolol (TENORMIN) 50 MG tablet 1/2 to 1 tab qd for BP as directed  90 tablet  99  .  atorvastatin (LIPITOR) 80 MG tablet Take 1/2 tab daily      . cholecalciferol (VITAMIN D) 1000 UNITS tablet Take 4,000 Units by mouth daily.      Marland Kitchen HYDROcodone-acetaminophen (NORCO) 5-325 MG per tablet 1 tab BID/ PRN pain  60 tablet  0  . vitamin C (ASCORBIC ACID) 500 MG tablet Take 500 mg by mouth daily.      . Vitamin E 200 UNITS TABS Take by mouth.      Marland Kitchen ZETIA 10 MG tablet        No current facility-administered medications on file prior to visit.   Medical History:  Past Medical History  Diagnosis Date  . Osteopenia   . Colon polyps     with divertirticulitis  . Hypertension   . Hyperlipidemia   . Vitamin D deficiency   . Prediabetes   . DJD (degenerative joint disease)   . Carotid bruit     Bilateral   Allergies:  Allergies  Allergen Reactions  . Codeine   . Crestor [Rosuvastatin]   . Fentanyl   . Hydrocodeine [Dihydrocodeine]   . Penicillins      Review of Systems: [X]  = complains of  [ ]  = denies  General: Fatigue [ ]  Fever [ ]  Chills [ ]  Weakness [ ]   Insomnia [ ]  Eyes: Redness [ ]  Blurred vision [ ]  Diplopia [ ]   ENT: Congestion [ ]  Sinus Pain [ ]  Post Nasal  Drip [ ]  Sore Throat [ ]  Earache [ ]   Cardiac: Chest pain/pressure [ ]  SOB [ ]  Orthopnea [ ]   Palpitations [ ]   Paroxysmal nocturnal dyspnea[ ]  Claudication [ ]  Edema [ ]   Pulmonary: Cough [ ]  Wheezing[ ]   SOB [ ]   Snoring [ ]   GI: Nausea [ ]  Vomiting[ ]  Dysphagia[ ]  Heartburn[ ]  Abdominal pain [ ]  Constipation [ ] ; Diarrhea [ ] ; BRBPR [ ]  Melena[ ]  GU: Hematuria[ ]  Dysuria [ ]  Nocturia[ ]  Urgency [ ]   Hesitancy [ ]  Discharge [ ]  Neuro: Headaches[ ]  Vertigo[ ]  Paresthesias[ ]  Spasm [ ]  Speech changes [ ]  Incoordination [ ]   Ortho: Arthritis [ ]  Joint pain [ ]  Muscle pain [ ]  Joint swelling [ ]  Back Pain [ ]  Skin:  Rash [ ]   Pruritis [ ]  Change in skin lesion [ ]   Psych: Depression[ ]  Anxiety[ ]  Confusion [ ]  Memory loss [ ]   Heme/Lypmh: Bleeding [ ]  Bruising [ ]  Enlarged lymph nodes [ ]   Endocrine: Visual  blurring [ ]  Paresthesia [ ]  Polyuria [ ]  Polydypsea [ ]    Heat/cold intolerance [ ]  Hypoglycemia [ ]   Family history- Review and unchanged Social history- Review and unchanged Physical Exam: BP 120/70  Pulse 60  Temp(Src) 97.7 F (36.5 C)  Resp 16  Ht 5\' 1"  (1.549 m)  Wt 138 lb (62.596 kg)  BMI 26.09 kg/m2 Wt Readings from Last 3 Encounters:  02/26/14 138 lb (62.596 kg)  11/29/13 137 lb (62.143 kg)  11/08/13 134 lb (60.782 kg)   General Appearance: Well nourished, in no apparent distress. Eyes: PERRLA, EOMs, conjunctiva no swelling or erythema Sinuses: No Frontal/maxillary tenderness ENT/Mouth: Ext aud canals clear, TMs without erythema, bulging. No erythema, swelling, or exudate on post pharynx.  Tonsils not swollen or erythematous. Hearing normal.  Neck: Supple, thyroid normal.  Respiratory: Respiratory effort normal, BS equal bilaterally without rales, rhonchi, wheezing or stridor.  Cardio: RRR with no MRGs. Brisk peripheral pulses without edema.  Abdomen: Soft, + BS.  Non tender, no guarding, rebound, hernias, masses. Lymphatics: Non tender without lymphadenopathy.  Musculoskeletal: Full ROM, 5/5 strength, normal gait.  Skin: Warm, dry without rashes, lesions, ecchymosis.  Neuro: Cranial nerves intact. Normal muscle tone, no cerebellar symptoms. Sensation intact.  Psych: Awake and oriented X 3, normal affect, Insight and Judgment appropriate.    Vicie Mutters, PA-C 1:40 PM [facility]

## 2014-02-27 LAB — BASIC METABOLIC PANEL WITH GFR
BUN: 10 mg/dL (ref 6–23)
CO2: 25 mEq/L (ref 19–32)
CREATININE: 0.71 mg/dL (ref 0.50–1.10)
Calcium: 9.4 mg/dL (ref 8.4–10.5)
Chloride: 102 mEq/L (ref 96–112)
GFR, EST NON AFRICAN AMERICAN: 87 mL/min
GFR, Est African American: >89 mL/min
Glucose, Bld: 101 mg/dL — ABNORMAL HIGH (ref 70–99)
Potassium: 4.3 mEq/L (ref 3.5–5.3)
Sodium: 137 mEq/L (ref 135–145)

## 2014-02-27 LAB — HEPATIC FUNCTION PANEL
ALBUMIN: 4.2 g/dL (ref 3.5–5.2)
ALT: 10 U/L (ref 0–35)
AST: 17 U/L (ref 0–37)
Alkaline Phosphatase: 73 U/L (ref 39–117)
BILIRUBIN TOTAL: 0.4 mg/dL (ref 0.2–1.2)
Bilirubin, Direct: 0.1 mg/dL (ref 0.0–0.3)
Indirect Bilirubin: 0.3 mg/dL (ref 0.2–1.2)
Total Protein: 7.2 g/dL (ref 6.0–8.3)

## 2014-02-27 LAB — INSULIN, FASTING: Insulin fasting, serum: 6.5 u[IU]/mL (ref 2.0–19.6)

## 2014-02-27 LAB — LIPID PANEL
CHOLESTEROL: 271 mg/dL — AB (ref 0–200)
HDL: 58 mg/dL (ref >39)
LDL Cholesterol: 150 mg/dL — ABNORMAL HIGH (ref 0–99)
TRIGLYCERIDES: 313 mg/dL — AB (ref <150)
Total CHOL/HDL Ratio: 4.7 Ratio
VLDL: 63 mg/dL — ABNORMAL HIGH (ref 0–40)

## 2014-02-27 LAB — MAGNESIUM: MAGNESIUM: 2.1 mg/dL (ref 1.5–2.5)

## 2014-02-27 LAB — TSH: TSH: 1.659 u[IU]/mL (ref 0.350–4.500)

## 2014-06-05 ENCOUNTER — Other Ambulatory Visit: Payer: Self-pay | Admitting: Internal Medicine

## 2014-06-10 ENCOUNTER — Encounter: Payer: Self-pay | Admitting: Internal Medicine

## 2014-06-10 ENCOUNTER — Ambulatory Visit (INDEPENDENT_AMBULATORY_CARE_PROVIDER_SITE_OTHER): Payer: Medicare Other | Admitting: Internal Medicine

## 2014-06-10 VITALS — BP 126/72 | HR 72 | Temp 97.9°F | Resp 16 | Ht 61.0 in | Wt 135.8 lb

## 2014-06-10 DIAGNOSIS — I10 Essential (primary) hypertension: Secondary | ICD-10-CM | POA: Diagnosis not present

## 2014-06-10 DIAGNOSIS — R7309 Other abnormal glucose: Secondary | ICD-10-CM | POA: Diagnosis not present

## 2014-06-10 DIAGNOSIS — Z79899 Other long term (current) drug therapy: Secondary | ICD-10-CM | POA: Diagnosis not present

## 2014-06-10 DIAGNOSIS — E559 Vitamin D deficiency, unspecified: Secondary | ICD-10-CM | POA: Diagnosis not present

## 2014-06-10 DIAGNOSIS — E785 Hyperlipidemia, unspecified: Secondary | ICD-10-CM | POA: Diagnosis not present

## 2014-06-10 DIAGNOSIS — R7303 Prediabetes: Secondary | ICD-10-CM

## 2014-06-10 LAB — CBC WITH DIFFERENTIAL/PLATELET
Basophils Absolute: 0.1 10*3/uL (ref 0.0–0.1)
Basophils Relative: 1 % (ref 0–1)
EOS ABS: 0.2 10*3/uL (ref 0.0–0.7)
EOS PCT: 3 % (ref 0–5)
HCT: 41.5 % (ref 36.0–46.0)
Hemoglobin: 14 g/dL (ref 12.0–15.0)
LYMPHS PCT: 26 % (ref 12–46)
Lymphs Abs: 2.1 10*3/uL (ref 0.7–4.0)
MCH: 32.9 pg (ref 26.0–34.0)
MCHC: 33.7 g/dL (ref 30.0–36.0)
MCV: 97.6 fL (ref 78.0–100.0)
MPV: 9.9 fL (ref 8.6–12.4)
Monocytes Absolute: 0.6 10*3/uL (ref 0.1–1.0)
Monocytes Relative: 7 % (ref 3–12)
Neutro Abs: 5.1 10*3/uL (ref 1.7–7.7)
Neutrophils Relative %: 63 % (ref 43–77)
Platelets: 298 10*3/uL (ref 150–400)
RBC: 4.25 MIL/uL (ref 3.87–5.11)
RDW: 12.8 % (ref 11.5–15.5)
WBC: 8.1 10*3/uL (ref 4.0–10.5)

## 2014-06-10 NOTE — Patient Instructions (Signed)

## 2014-06-10 NOTE — Progress Notes (Signed)
Patient ID: Stacy [name], female   DOB: 11-06-1945, 69 y.o.   MRN: [id number]   This very nice 69 y.o. DWF presents for 3 month follow up with Hypertension, Hyperlipidemia, Pre-Diabetes and Vitamin D Deficiency. Patient also c/o LLQ cramping discomfort since last week for which she started on an old Rx Cipro and is improved. No N/V/diarrhea/ blood in BMs/ fever or chills.   Patient is treated for HTN (1995) & BP has been controlled at home. Today's BP: 126/72 mmHg. Patient has had no complaints of any cardiac type chest pain, palpitations, dyspnea/orthopnea/PND, dizziness, claudication, or dependent edema.   Hyperlipidemia is not controlled with diet &  And has been very sporadic with taking meds. Patient denies myalgias or other med SE's. Last Lipids were Total Chol 271; HDL 58; LDL 150 ; and with elevated Trig 313 on 02/26/2014.   Also, the patient has history of PreDiabetes since Feb 2009 with an A1c 6.4% and she has attempted to manage this with diet. and has had no symptoms of reactive hypoglycemia, diabetic polys, paresthesias or visual blurring.  Last A1c was  6.0 % on 02/26/2014.   Further, the patient also has history of Vitamin D Deficiency of 7 in 2009 and supplements vitamin D without any suspected side-effects. Last vitamin D was 57 on  11/08/2013.  Medication Sig  . ALPRAZolam  0.5 MG tablet Take 1 tablet  2  times daily as needed for anxiety or sleep.  Marland Kitchen aspirin 81 MG tablet Take 81 mg by mouth daily.  Marland Kitchen atenolol  50 MG tablet TAKE ONE-HALF TO ONE TAB ONCE DAILY  . atorvastatin  80 MG tablet Take 1/2 tab daily  . VITAMIN D 1000 U Take 4,000 Units by mouth daily.  Lebron Quam 5-325  1 tab BID/ PRN pain  . vitamin C  500 MG tablet Take 500 mg by mouth daily.  . Vitamin E 200 UNITS TABS Take by mouth.  Marland Kitchen ZETIA 10 MG tablet    Allergies  Allergen Reactions  . Codeine   . Crestor [Rosuvastatin]   . Fentanyl   . Hydrocodeine [Dihydrocodeine]   . Penicillins    PMHx:   Past Medical  History  Diagnosis Date  . Osteopenia   . Colon polyps     with divertirticulitis  . Hypertension   . Hyperlipidemia   . Vitamin D deficiency   . Prediabetes   . DJD (degenerative joint disease)   . Carotid bruit     Bilateral   Immunization History  Administered Date(s) Administered  . Pneumococcal Polysaccharide-23 08/21/2012  . Td 05/04/2003  . Tdap 11/08/2013   Past Surgical History  Procedure Laterality Date  . Neck surgery -  c4 -5/6  fushion  2012   . Laminectomy - L 5   2002  . Hand surgery    . Trigger finger release    . 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  Systems Review:  Constitutional: Denies fever, chills, wt changes, headaches, insomnia, fatigue, night sweats, change in appetite. Eyes: Denies redness, blurred vision, diplopia, discharge, itchy, watery eyes.  ENT: Denies discharge, congestion, post nasal drip, epistaxis, sore throat, earache, hearing loss, dental pain, tinnitus, vertigo, sinus pain, snoring.  CV: Denies chest pain, palpitations, irregular heartbeat, syncope, dyspnea, diaphoresis, orthopnea, PND, claudication or edema. Respiratory: denies cough, dyspnea, DOE, pleurisy, hoarseness,  laryngitis, wheezing.  Gastrointestinal: Denies dysphagia, odynophagia, heartburn, reflux, water brash, nausea, vomiting, bloating, diarrhea, constipation, hematemesis, melena, hematochezia  or hemorrhoids. Abdominal pain & cramps as above. Genitourinary: Denies dysuria, frequency, urgency, nocturia, hesitancy, discharge, hematuria or flank pain. Musculoskeletal: Denies arthralgias, myalgias, stiffness, jt. swelling, pain, limping or strain/sprain.  Skin: Denies pruritus, rash, hives, warts, acne, eczema or change in skin lesion(s). Neuro: No weakness, tremor, incoordination, spasms, paresthesia or pain. Psychiatric: Denies confusion, memory loss or sensory loss. Endo:  Denies change in weight, skin or hair change.  Heme/Lymph: No excessive bleeding, bruising or enlarged lymph nodes.  Physical Exam  BP 126/72       Pulse 72      Temp 97.9 F       Resp 16      Ht 5\' 1"        Wt 135 lb 12.8 oz      BMI 25.67  Appears well nourished and in no distress. Eyes: PERRLA, EOMs, conjunctiva no swelling or erythema. Sinuses: No frontal/maxillary tenderness ENT/Mouth: EAC's clear, TM's nl w/o erythema, bulging. Nares clear w/o erythema, swelling, exudates. Oropharynx clear without erythema or exudates. Oral hygiene is good. Tongue normal, non obstructing. Hearing intact.  Neck: Supple. Thyroid nl. Car 2+/2+ without bruits, nodes or JVD. Chest: Respirations nl with BS clear & equal w/o rales, rhonchi, wheezing or stridor.  Cor: Heart sounds normal w/ regular rate and rhythm without sig. murmurs, gallops, clicks, or rubs. Peripheral pulses normal and equal  without edema.  Abdomen: Soft & bowel sounds normal. Sl. tender LLQ w/o guarding, rebound, hernias, masses, or organomegaly.  Lymphatics: Unremarkable.  Musculoskeletal: Full ROM all peripheral extremities, joint stability, 5/5 strength, and normal gait.  Skin: Warm, dry without exposed rashes, lesions or ecchymosis apparent.  Neuro: Cranial nerves intact, reflexes equal bilaterally. Sensory-motor testing grossly intact. Tendon reflexes grossly intact.  Pysch: Alert & oriented x 3.  Insight and judgement nl & appropriate. No ideations.  Assessment and Plan:  1. Essential hypertension  - TSH  2. Hyperlipidemia  - Lipid panel  3. Prediabetes  - Hemoglobin A1c - Insulin, fasting  4. Vitamin D deficiency  - Vit D  25 hydroxy (rtn osteoporosis monitoring)  5. Medication management  - CBC with Differential/Platelet - BASIC METABOLIC PANEL WITH GFR - Hepatic function panel - Magnesium  6. LLQ Abd Pain -      Recommended regular exercise, BP monitoring, weight control, and discussed med and  SE's. Recommended labs to assess and monitor clinical status. Further disposition pending results of labs.

## 2014-06-11 LAB — BASIC METABOLIC PANEL WITH GFR
BUN: 11 mg/dL (ref 6–23)
CO2: 26 meq/L (ref 19–32)
CREATININE: 0.68 mg/dL (ref 0.50–1.10)
Calcium: 9.8 mg/dL (ref 8.4–10.5)
Chloride: 101 mEq/L (ref 96–112)
GFR, Est African American: >89 mL/min
Glucose, Bld: 112 mg/dL — ABNORMAL HIGH (ref 70–99)
Potassium: 4.3 mEq/L (ref 3.5–5.3)
SODIUM: 138 meq/L (ref 135–145)

## 2014-06-11 LAB — HEPATIC FUNCTION PANEL
ALBUMIN: 4.1 g/dL (ref 3.5–5.2)
ALT: 8 U/L (ref 0–35)
AST: 13 U/L (ref 0–37)
Alkaline Phosphatase: 91 U/L (ref 39–117)
BILIRUBIN TOTAL: 0.3 mg/dL (ref 0.2–1.2)
Bilirubin, Direct: <0.1 mg/dL (ref 0.0–0.3)
TOTAL PROTEIN: 7.1 g/dL (ref 6.0–8.3)

## 2014-06-11 LAB — INSULIN, FASTING: Insulin fasting, serum: 10.9 u[IU]/mL (ref 2.0–19.6)

## 2014-06-11 LAB — MAGNESIUM: MAGNESIUM: 2.1 mg/dL (ref 1.5–2.5)

## 2014-06-11 LAB — TSH: TSH: 2.17 u[IU]/mL (ref 0.350–4.500)

## 2014-06-11 LAB — LIPID PANEL
CHOL/HDL RATIO: 4.3 ratio
Cholesterol: 237 mg/dL — ABNORMAL HIGH (ref 0–200)
HDL: 55 mg/dL (ref >39)
LDL Cholesterol: 133 mg/dL — ABNORMAL HIGH (ref 0–99)
TRIGLYCERIDES: 245 mg/dL — AB (ref <150)
VLDL: 49 mg/dL — AB (ref 0–40)

## 2014-06-11 LAB — HEMOGLOBIN A1C
HEMOGLOBIN A1C: 6 % — AB (ref <5.7)
MEAN PLASMA GLUCOSE: 126 mg/dL — AB (ref <117)

## 2014-06-11 LAB — VITAMIN D 25 HYDROXY (VIT D DEFICIENCY, FRACTURES): Vit D, 25-Hydroxy: 36 ng/mL (ref 30–100)

## 2014-10-28 ENCOUNTER — Other Ambulatory Visit: Payer: Self-pay

## 2014-11-11 ENCOUNTER — Ambulatory Visit (INDEPENDENT_AMBULATORY_CARE_PROVIDER_SITE_OTHER): Payer: Medicare Other | Admitting: Internal Medicine

## 2014-11-11 ENCOUNTER — Encounter: Payer: Self-pay | Admitting: Internal Medicine

## 2014-11-11 VITALS — BP 122/78 | HR 70 | Temp 98.2°F | Resp 16 | Ht 61.0 in | Wt 126.0 lb

## 2014-11-11 DIAGNOSIS — Z0000 Encounter for general adult medical examination without abnormal findings: Secondary | ICD-10-CM

## 2014-11-11 DIAGNOSIS — D539 Nutritional anemia, unspecified: Secondary | ICD-10-CM | POA: Diagnosis not present

## 2014-11-11 DIAGNOSIS — R7303 Prediabetes: Secondary | ICD-10-CM

## 2014-11-11 DIAGNOSIS — Z1331 Encounter for screening for depression: Secondary | ICD-10-CM

## 2014-11-11 DIAGNOSIS — E785 Hyperlipidemia, unspecified: Secondary | ICD-10-CM

## 2014-11-11 DIAGNOSIS — Z0001 Encounter for general adult medical examination with abnormal findings: Secondary | ICD-10-CM | POA: Diagnosis not present

## 2014-11-11 DIAGNOSIS — E559 Vitamin D deficiency, unspecified: Secondary | ICD-10-CM | POA: Diagnosis not present

## 2014-11-11 DIAGNOSIS — Z9181 History of falling: Secondary | ICD-10-CM

## 2014-11-11 DIAGNOSIS — Z1212 Encounter for screening for malignant neoplasm of rectum: Secondary | ICD-10-CM

## 2014-11-11 DIAGNOSIS — R7309 Other abnormal glucose: Secondary | ICD-10-CM | POA: Diagnosis not present

## 2014-11-11 DIAGNOSIS — R6889 Other general symptoms and signs: Secondary | ICD-10-CM | POA: Diagnosis not present

## 2014-11-11 DIAGNOSIS — M159 Polyosteoarthritis, unspecified: Secondary | ICD-10-CM

## 2014-11-11 DIAGNOSIS — I10 Essential (primary) hypertension: Secondary | ICD-10-CM

## 2014-11-11 DIAGNOSIS — M150 Primary generalized (osteo)arthritis: Secondary | ICD-10-CM

## 2014-11-11 DIAGNOSIS — Z79899 Other long term (current) drug therapy: Secondary | ICD-10-CM | POA: Diagnosis not present

## 2014-11-11 LAB — CBC WITH DIFFERENTIAL/PLATELET
BASOS ABS: 0.1 10*3/uL (ref 0.0–0.1)
Basophils Relative: 1 % (ref 0–1)
EOS ABS: 0.3 10*3/uL (ref 0.0–0.7)
Eosinophils Relative: 3 % (ref 0–5)
HCT: 42.7 % (ref 36.0–46.0)
HEMOGLOBIN: 14.3 g/dL (ref 12.0–15.0)
LYMPHS ABS: 2.9 10*3/uL (ref 0.7–4.0)
Lymphocytes Relative: 34 % (ref 12–46)
MCH: 32.7 pg (ref 26.0–34.0)
MCHC: 33.5 g/dL (ref 30.0–36.0)
MCV: 97.7 fL (ref 78.0–100.0)
MPV: 10 fL (ref 8.6–12.4)
Monocytes Absolute: 0.5 10*3/uL (ref 0.1–1.0)
Monocytes Relative: 6 % (ref 3–12)
NEUTROS ABS: 4.8 10*3/uL (ref 1.7–7.7)
Neutrophils Relative %: 56 % (ref 43–77)
PLATELETS: 241 10*3/uL (ref 150–400)
RBC: 4.37 MIL/uL (ref 3.87–5.11)
RDW: 13.7 % (ref 11.5–15.5)
WBC: 8.6 10*3/uL (ref 4.0–10.5)

## 2014-11-11 NOTE — Progress Notes (Signed)
Patient ID: Stacy [name], female   DOB: 06/09/1945, 69 y.o.   MRN: [id number]   MEDICARE ANNUAL WELLNESS VISIT AND CPE  Assessment:    1. Essential hypertension -monitor at home -cont meds - Urinalysis, Routine w reflex microscopic (not at [facility]) - Microalbumin / creatinine urine ratio - EKG 12-Lead - TSH  2. Prediabetes -diet and exercise - Hemoglobin A1c - Insulin, random  3. Hyperlipidemia -diet and exercise - Lipid panel  4. Vitamin D deficiency -cont supplement - Vit D  25 hydroxy (rtn osteoporosis monitoring)  5. Medication management  - CBC with Differential/Platelet - BASIC METABOLIC PANEL WITH GFR - Hepatic function panel - Magnesium  6. Primary osteoarthritis involving multiple joints -tylenol prn  7. Deficiency anemia  - Iron and TIBC - Vitamin B12  8. Screening for rectal cancer  - POC Hemoccult Bld/Stl (3-Cd Home Screen); Future    Over 40 minutes of exam, counseling, chart review and critical decision making was performed  Plan:   During the course of the visit the patient was educated and counseled about appropriate screening and preventive services including:    Pneumococcal vaccine   Prevnar 13  Influenza vaccine  Td vaccine  Screening electrocardiogram  Bone densitometry screening  Colorectal cancer screening  Diabetes screening  Glaucoma screening  Nutrition counseling   Advanced directives: requested  Conditions/risks identified: BMI: Discussed weight loss, diet, and increase physical activity.  Increase physical activity: AHA recommends 150 minutes of physical activity a week.  Medications reviewed Diabetes is at goal, ACE/ARB therapy: No, Reason not on Ace Inhibitor/ARB therapy:  not indicated Urinary Incontinence is not an issue: discussed non pharmacology and pharmacology options.  Fall risk: low- discussed PT, home fall assessment, medications.    Subjective:  Stacy [name] is a 69 y.o. female who  presents for Medicare Annual Wellness Visit and complete physical.  Date of last medicare wellness visit is unknown.  She has had elevated blood pressure for  years. Her blood pressure has been controlled at home, today their BP is BP: 122/78 mmHg She does workout. She denies chest pain, shortness of breath, dizziness.  She is on cholesterol medication and denies myalgias. Her cholesterol is not at goal. The cholesterol last visit was:   Lab Results  Component Value Date   CHOL 237* 06/10/2014   HDL 55 06/10/2014   LDLCALC 133* 06/10/2014   TRIG 245* 06/10/2014   CHOLHDL 4.3 06/10/2014    She has not taken her zetia and has not taken her lipitor on a regular basis.  She has had prediabetes. She has been working on diet and exercise for prediabetes, and denies foot ulcerations, hyperglycemia, hypoglycemia , increased appetite, nausea, paresthesia of the feet, polydipsia, polyuria, visual disturbances, vomiting and weight loss. Last A1C in the office was:  Lab Results  Component Value Date   HGBA1C 6.0* 06/10/2014   Patient is on Vitamin D supplement.   Lab Results  Component Value Date   VD25OH 36 06/10/2014     She reports that she does have the occasional left lower side pain which bothers her.  She reports that it is a crampy pain which will come out of the blue.  She reports that she has it every 2-3 months and it lasts for a few days.  She reports that cipro and flagyl help.    Medication Review: Current Outpatient Prescriptions on File Prior to Visit  Medication Sig Dispense Refill  . ALPRAZolam (XANAX) 0.5 MG tablet Take  1 tablet (0.5 mg total) by mouth 2 (two) times daily as needed for anxiety or sleep. 60 tablet 0  . aspirin 81 MG tablet Take 81 mg by mouth daily.    Marland Kitchen atenolol (TENORMIN) 50 MG tablet TAKE ONE-HALF TO ONE TABLET BY MOUTH ONCE DAILY FOR  BLOOD  PRESSURE 90 tablet 0  . atorvastatin (LIPITOR) 80 MG tablet Take 1/2 tab daily    . cholecalciferol (VITAMIN D) 1000  UNITS tablet Take 4,000 Units by mouth daily.    Marland Kitchen HYDROcodone-acetaminophen (NORCO) 5-325 MG per tablet 1 tab BID/ PRN pain 60 tablet 0  . vitamin C (ASCORBIC ACID) 500 MG tablet Take 500 mg by mouth daily.    . Vitamin E 200 UNITS TABS Take by mouth.     No current facility-administered medications on file prior to visit.    Current Problems (verified) Patient Active Problem List   Diagnosis Date Noted  . Medication management 06/10/2014  . Hypertension   . Hyperlipidemia   . Vitamin D deficiency   . Prediabetes   . DJD (degenerative joint disease)     Screening Tests Immunization History  Administered Date(s) Administered  . Influenza-Unspecified 01/31/2014  . Pneumococcal Polysaccharide-23 08/21/2012  . Td 05/04/2003  . Tdap 11/08/2013    Preventative care: Last colonoscopy: 2012, Dr. Halford Decamp Last mammogram: 12/21/2013 DEXA:12/20/13  Results for orders placed during the hospital encounter of 12/20/13  MM Digital Screening   Narrative CLINICAL DATA:  Screening.  EXAM: DIGITAL SCREENING BILATERAL MAMMOGRAM WITH CAD  COMPARISON:  Previous exam(s).  ACR Breast Density Category b: There are scattered areas of fibroglandular density.  FINDINGS: There are no findings suspicious for malignancy. Images were processed with CAD.  IMPRESSION: No mammographic evidence of malignancy. A result letter of this screening mammogram will be mailed directly to the patient.  RECOMMENDATION: Screening mammogram in one year. (Code:[id number])  BI-RADS CATEGORY  1: Negative.   Electronically Signed   By: Marin Olp M.D.   On: 12/21/2013 17:02     Names of Other Physician/Practitioners you currently use: 1. [facility] here for primary care 2. Dr. Ronnald Ramp, eye doctor, last visit 2016 3. Dr. Lynnette Caffey, dentist, last visit 2016 Patient Care Team: [name], MD as PCP - General (Internal Medicine) Etta Quill, OD as Referring Physician  (Optometry) Jackqulyn Livings, DDS (Dentistry) Theodis Sato, MD as Consulting Physician (Orthopedic Surgery) Marylynn Pearson, MD as Consulting Physician (Obstetrics and Gynecology) Juanita Craver, MD as Consulting Physician (Gastroenterology)  SURGICAL HISTORY Past Surgical History  Procedure Laterality Date  . Neck surgery  2012     c4 -5/6  fushion  . Laminectomy  2002    L 5   . Hand surgery    . Trigger finger release    . Head injury  1995   FAMILY HISTORY Family History  Problem Relation Age of Onset  . Hyperlipidemia Mother   . Heart disease Mother     7 way bypass/ value replacement/pacemaker  . Heart attack Mother 57  . Diabetes Mother   . Cancer - Other Mother     lung  . Hypertension Father   . Heart attack Father 49  . Cancer - Other Father     throat   SOCIAL HISTORY History  Substance Use Topics  . Smoking status: Current Every Day Smoker  . Smokeless tobacco: Not on file     Comment: smokes 2 cigarettes per day  . Alcohol Use: No  MEDICARE WELLNESS OBJECTIVES: Tobacco use: She does smoke.  Patient is a former smoker. If yes, counseling given Alcohol Current alcohol use: none Osteoporosis: postmenopausal estrogen deficiency and amenorrhea, History of fracture in the past year: no Fall risk: Minimal risk Hearing: normal Visual acuity: normal,  does perform annual eye exam Diet: patient eats lot of fruits or veggies, but does not eat much by her report. Physical activity:   Cardiac risk factors:   Depression/mood screen:   Depression screen [facility] 2/9 11/11/2014  Decreased Interest 0  Down, Depressed, Hopeless 0  PHQ - 2 Score 0    ADLs:  No flowsheet data found.   Cognitive Testing  Alert? Yes  Normal Appearance?Yes  Oriented to person? Yes  Place? Yes   Time? Yes  Recall of three objects?  Yes  Can perform simple calculations? Yes  Displays appropriate judgment?Yes  Can read the correct time from a watch face?Yes  EOL planning: Does  patient have an advance directive?: No Would patient like information on creating an advanced directive?: Yes - Educational materials given  Review of Systems  Constitutional: Positive for malaise/fatigue. Negative for fever and chills.  HENT: Negative for congestion, ear pain and sore throat.   Eyes: Negative.   Respiratory: Negative for cough, shortness of breath and wheezing.   Cardiovascular: Negative for chest pain, palpitations and leg swelling.  Gastrointestinal: Positive for diarrhea. Negative for heartburn, nausea, vomiting, constipation, blood in stool and melena.  Genitourinary: Negative.   Skin: Negative.   Neurological: Negative for dizziness, loss of consciousness and headaches.  Psychiatric/Behavioral: Negative for depression. The patient has insomnia. The patient is not nervous/anxious.      Objective:     Today's Vitals   11/11/14 1352  BP: 122/78  Pulse: 70  Temp: 98.2 F (36.8 C)  TempSrc: Temporal  Resp: 16  Height: '5\' 1"'$  (1.549 m)  Weight: 126 lb (57.153 kg)   Body mass index is 23.82 kg/(m^2).  General appearance: alert, no distress, WD/WN, female HEENT: normocephalic, sclerae anicteric, TMs pearly, nares patent, no discharge or erythema, pharynx normal Oral cavity: MMM, no lesions Neck: supple, no lymphadenopathy, no thyromegaly, no masses Heart: RRR, normal S1, S2, no murmurs Lungs: CTA bilaterally, no wheezes, rhonchi, or rales Abdomen: +bs, soft, non tender, non distended, no masses, no hepatomegaly, no splenomegaly Musculoskeletal: nontender, no swelling, no obvious deformity Extremities: no edema, no cyanosis, no clubbing Pulses: 2+ symmetric, upper and lower extremities, normal cap refill Neurological: alert, oriented x 3, CN2-12 intact, strength normal upper extremities and lower extremities, sensation normal throughout, DTRs 2+ throughout, no cerebellar signs, gait normal Psychiatric: normal affect, behavior normal, pleasant   Medicare  Attestation I have personally reviewed: The patient's medical and social history Their use of alcohol, tobacco or illicit drugs Their current medications and supplements The patient's functional ability including ADLs,fall risks, home safety risks, cognitive, and hearing and visual impairment Diet and physical activities Evidence for depression or mood disorders  The patient's weight, height, BMI, and visual acuity have been recorded in the chart.  I have made referrals, counseling, and provided education to the patient based on review of the above and I have provided the patient with a written personalized care plan for preventive services.     Starlyn Skeans, PA-C   11/11/2014

## 2014-11-11 NOTE — Patient Instructions (Signed)
Preventive Care for Adults  A healthy lifestyle and preventive care can promote health and wellness. Preventive health guidelines for women include the following key practices.  A routine yearly physical is a good way to check with your health care provider about your health and preventive screening. It is a chance to share any concerns and updates on your health and to receive a thorough exam.  Visit your dentist for a routine exam and preventive care every 6 months. Brush your teeth twice a day and floss once a day. Good oral hygiene prevents tooth decay and gum disease.  The frequency of eye exams is based on your age, health, family medical history, use of contact lenses, and other factors. Follow your health care provider's recommendations for frequency of eye exams.  Eat a healthy diet. Foods like vegetables, fruits, whole grains, low-fat dairy products, and lean protein foods contain the nutrients you need without too many calories. Decrease your intake of foods high in solid fats, added sugars, and salt. Eat the right amount of calories for you.Get information about a proper diet from your health care provider, if necessary.  Regular physical exercise is one of the most important things you can do for your health. Most adults should get at least 150 minutes of moderate-intensity exercise (any activity that increases your heart rate and causes you to sweat) each week. In addition, most adults need muscle-strengthening exercises on 2 or more days a week.  Maintain a healthy weight. The body mass index (BMI) is a screening tool to identify possible weight problems. It provides an estimate of body fat based on height and weight. Your health care provider can find your BMI and can help you achieve or maintain a healthy weight.For adults 20 years and older:  A BMI below 18.5 is considered underweight.  A BMI of 18.5 to 24.9 is normal.  A BMI of 25 to 29.9 is considered overweight.  A BMI  of 30 and above is considered obese.  Maintain normal blood lipids and cholesterol levels by exercising and minimizing your intake of saturated fat. Eat a balanced diet with plenty of fruit and vegetables. If your lipid or cholesterol levels are high, you are over 50, or you are at high risk for heart disease, you may need your cholesterol levels checked more frequently.Ongoing high lipid and cholesterol levels should be treated with medicines if diet and exercise are not working.  If you smoke, find out from your health care provider how to quit. If you do not use tobacco, do not start.  Lung cancer screening is recommended for adults aged 2-80 years who are at high risk for developing lung cancer because of a history of smoking. A yearly low-dose CT scan of the lungs is recommended for people who have at least a 30-pack-year history of smoking and are a current smoker or have quit within the past 15 years. A pack year of smoking is smoking an average of 1 pack of cigarettes a day for 1 year (for example: 1 pack a day for 30 years or 2 packs a day for 15 years). Yearly screening should continue until the smoker has stopped smoking for at least 15 years. Yearly screening should be stopped for people who develop a health problem that would prevent them from having lung cancer treatment.  Avoid use of street drugs. Do not share needles with anyone. Ask for help if you need support or instructions about stopping the use of drugs.  High  blood pressure causes heart disease and increases the risk of stroke.  Ongoing high blood pressure should be treated with medicines if weight loss and exercise do not work.  If you are 55-79 years old, ask your health care provider if you should take aspirin to prevent strokes.  Diabetes screening involves taking a blood sample to check your fasting blood sugar level. This should be done once every 3 years, after age 45, if you are within normal weight and without risk  factors for diabetes. Testing should be considered at a younger age or be carried out more frequently if you are overweight and have at least 1 risk factor for diabetes.  Breast cancer screening is essential preventive care for women. You should practice "breast self-awareness." This means understanding the normal appearance and feel of your breasts and may include breast self-examination. Any changes detected, no matter how small, should be reported to a health care provider. Women in their 20s and 30s should have a clinical breast exam (CBE) by a health care provider as part of a regular health exam every 1 to 3 years. After age 40, women should have a CBE every year. Starting at age 40, women should consider having a mammogram (breast X-ray test) every year. Women who have a family history of breast cancer should talk to their health care provider about genetic screening. Women at a high risk of breast cancer should talk to their health care providers about having an MRI and a mammogram every year.  Breast cancer gene (BRCA)-related cancer risk assessment is recommended for women who have family members with BRCA-related cancers. BRCA-related cancers include breast, ovarian, tubal, and peritoneal cancers. Having family members with these cancers may be associated with an increased risk for harmful changes (mutations) in the breast cancer genes BRCA1 and BRCA2. Results of the assessment will determine the need for genetic counseling and BRCA1 and BRCA2 testing.  Routine pelvic exams to screen for cancer are no longer recommended for nonpregnant women who are considered low risk for cancer of the pelvic organs (ovaries, uterus, and vagina) and who do not have symptoms. Ask your health care provider if a screening pelvic exam is right for you.  If you have had past treatment for cervical cancer or a condition that could lead to cancer, you need Pap tests and screening for cancer for at least 20 years after  your treatment. If Pap tests have been discontinued, your risk factors (such as having a new sexual partner) need to be reassessed to determine if screening should be resumed. Some women have medical problems that increase the chance of getting cervical cancer. In these cases, your health care provider may recommend more frequent screening and Pap tests.    Colorectal cancer can be detected and often prevented. Most routine colorectal cancer screening begins at the age of 50 years and continues through age 75 years. However, your health care provider may recommend screening at an earlier age if you have risk factors for colon cancer. On a yearly basis, your health care provider may provide home test kits to check for hidden blood in the stool. Use of a small camera at the end of a tube, to directly examine the colon (sigmoidoscopy or colonoscopy), can detect the earliest forms of colorectal cancer. Talk to your health care provider about this at age 50, when routine screening begins. Direct exam of the colon should be repeated every 5-10 years through age 75 years, unless early forms of pre-cancerous   polyps or small growths are found.  Osteoporosis is a disease in which the bones lose minerals and strength with aging. This can result in serious bone fractures or breaks. The risk of osteoporosis can be identified using a bone density scan. Women ages 68 years and over and women at risk for fractures or osteoporosis should discuss screening with their health care providers. Ask your health care provider whether you should take a calcium supplement or vitamin D to reduce the rate of osteoporosis.  Menopause can be associated with physical symptoms and risks. Hormone replacement therapy is available to decrease symptoms and risks. You should talk to your health care provider about whether hormone replacement therapy is right for you.  Use sunscreen. Apply sunscreen liberally and repeatedly throughout the day.  You should seek shade when your shadow is shorter than you. Protect yourself by wearing long sleeves, pants, a wide-brimmed hat, and sunglasses year round, whenever you are outdoors.  Once a month, do a whole body skin exam, using a mirror to look at the skin on your back. Tell your health care provider of new moles, moles that have irregular borders, moles that are larger than a pencil eraser, or moles that have changed in shape or color.  Stay current with required vaccines (immunizations).  Influenza vaccine. All adults should be immunized every year.  Tetanus, diphtheria, and acellular pertussis (Td, Tdap) vaccine. Pregnant women should receive 1 dose of Tdap vaccine during each pregnancy. The dose should be obtained regardless of the length of time since the last dose. Immunization is preferred during the 27th-36th week of gestation. An adult who has not previously received Tdap or who does not know her vaccine status should receive 1 dose of Tdap. This initial dose should be followed by tetanus and diphtheria toxoids (Td) booster doses every 10 years. Adults with an unknown or incomplete history of completing a 3-dose immunization series with Td-containing vaccines should begin or complete a primary immunization series including a Tdap dose. Adults should receive a Td booster every 10 years.    Zoster vaccine. One dose is recommended for adults aged 36 years or older unless certain conditions are present.    Pneumococcal 13-valent conjugate (PCV13) vaccine. When indicated, a person who is uncertain of her immunization history and has no record of immunization should receive the PCV13 vaccine. An adult aged 1 years or older who has certain medical conditions and has not been previously immunized should receive 1 dose of PCV13 vaccine. This PCV13 should be followed with a dose of pneumococcal polysaccharide (PPSV23) vaccine. The PPSV23 vaccine dose should be obtained at least 8 weeks after the  dose of PCV13 vaccine. An adult aged 73 years or older who has certain medical conditions and previously received 1 or more doses of PPSV23 vaccine should receive 1 dose of PCV13. The PCV13 vaccine dose should be obtained 1 or more years after the last PPSV23 vaccine dose.    Pneumococcal polysaccharide (PPSV23) vaccine. When PCV13 is also indicated, PCV13 should be obtained first. All adults aged 47 years and older should be immunized. An adult younger than age 86 years who has certain medical conditions should be immunized. Any person who resides in a nursing home or long-term care facility should be immunized. An adult smoker should be immunized. People with an immunocompromised condition and certain other conditions should receive both PCV13 and PPSV23 vaccines. People with human immunodeficiency virus (HIV) infection should be immunized as soon as possible after diagnosis. Immunization  chemotherapy or radiation therapy should be avoided. Routine use of PPSV23 vaccine is not recommended for American Indians, [place] Natives, or people younger than 65 years unless there are medical conditions that require PPSV23 vaccine. When indicated, people who have unknown immunization and have no record of immunization should receive PPSV23 vaccine. One-time revaccination 5 years after the first dose of PPSV23 is recommended for people aged 19-64 years who have chronic kidney failure, nephrotic syndrome, asplenia, or immunocompromised conditions. People who received 1-2 doses of PPSV23 before age 65 years should receive another dose of PPSV23 vaccine at age 65 years or later if at least 5 years have passed since the previous dose. Doses of PPSV23 are not needed for people immunized with PPSV23 at or after age 65 years.   Preventive Services / Frequency  Ages 65 years and over  Blood pressure check.  Lipid and cholesterol check.  Lung cancer screening. / Every year if you are aged 55-80 years and have a  30-pack-year history of smoking and currently smoke or have quit within the past 15 years. Yearly screening is stopped once you have quit smoking for at least 15 years or develop a health problem that would prevent you from having lung cancer treatment.  Clinical breast exam.** / Every year after age 40 years.  BRCA-related cancer risk assessment.** / For women who have family members with a BRCA-related cancer (breast, ovarian, tubal, or peritoneal cancers).  Mammogram.** / Every year beginning at age 40 years and continuing for as long as you are in good health. Consult with your health care provider.  Pap test.** / Every 3 years starting at age 30 years through age 65 or 70 years with 3 consecutive normal Pap tests. Testing can be stopped between 65 and 70 years with 3 consecutive normal Pap tests and no abnormal Pap or HPV tests in the past 10 years.  Fecal occult blood test (FOBT) of stool. / Every year beginning at age 50 years and continuing until age 75 years. You may not need to do this test if you get a colonoscopy every 10 years.  Flexible sigmoidoscopy or colonoscopy.** / Every 5 years for a flexible sigmoidoscopy or every 10 years for a colonoscopy beginning at age 50 years and continuing until age 75 years.  Hepatitis C blood test.** / For all people born from 1945 through 1965 and any individual with known risks for hepatitis C.  Osteoporosis screening.** / A one-time screening for women ages 65 years and over and women at risk for fractures or osteoporosis.  Skin self-exam. / Monthly.  Influenza vaccine. / Every year.  Tetanus, diphtheria, and acellular pertussis (Tdap/Td) vaccine.** / 1 dose of Td every 10 years.  Zoster vaccine.** / 1 dose for adults aged 60 years or older.  Pneumococcal 13-valent conjugate (PCV13) vaccine.** / Consult your health care provider.  Pneumococcal polysaccharide (PPSV23) vaccine.** / 1 dose for all adults aged 65 years and older. Screening  for abdominal aortic aneurysm (AAA)  by ultrasound is recommended for people who have history of high blood pressure or who are current or former smokers.  

## 2014-11-12 ENCOUNTER — Other Ambulatory Visit: Payer: Self-pay | Admitting: *Deleted

## 2014-11-12 LAB — URINALYSIS, ROUTINE W REFLEX MICROSCOPIC
BILIRUBIN URINE: NEGATIVE
GLUCOSE, UA: NEGATIVE mg/dL
Hgb urine dipstick: NEGATIVE
Ketones, ur: NEGATIVE mg/dL
Nitrite: NEGATIVE
PH: 6 (ref 5.0–8.0)
PROTEIN: NEGATIVE mg/dL
SPECIFIC GRAVITY, URINE: 1.019 (ref 1.005–1.030)
UROBILINOGEN UA: 0.2 mg/dL (ref 0.0–1.0)

## 2014-11-12 LAB — LIPID PANEL
CHOL/HDL RATIO: 5 ratio
Cholesterol: 322 mg/dL — ABNORMAL HIGH (ref 0–200)
HDL: 65 mg/dL (ref >=46)
LDL Cholesterol: 202 mg/dL — ABNORMAL HIGH (ref 0–99)
TRIGLYCERIDES: 276 mg/dL — AB (ref <150)
VLDL: 55 mg/dL — ABNORMAL HIGH (ref 0–40)

## 2014-11-12 LAB — TSH: TSH: 1.738 u[IU]/mL (ref 0.350–4.500)

## 2014-11-12 LAB — URINALYSIS, MICROSCOPIC ONLY
BACTERIA UA: NONE SEEN
Casts: NONE SEEN
Crystals: NONE SEEN

## 2014-11-12 LAB — IRON AND TIBC
%SAT: 28 % (ref 20–55)
IRON: 89 ug/dL (ref 42–145)
TIBC: 313 ug/dL (ref 250–470)
UIBC: 224 ug/dL (ref 125–400)

## 2014-11-12 LAB — BASIC METABOLIC PANEL WITH GFR
BUN: 15 mg/dL (ref 6–23)
CO2: 26 mEq/L (ref 19–32)
Calcium: 10 mg/dL (ref 8.4–10.5)
Chloride: 102 mEq/L (ref 96–112)
Creat: 0.84 mg/dL (ref 0.50–1.10)
GFR, EST AFRICAN AMERICAN: 81 mL/min
GFR, Est Non African American: 71 mL/min
Glucose, Bld: 94 mg/dL (ref 70–99)
POTASSIUM: 4.1 meq/L (ref 3.5–5.3)
Sodium: 140 mEq/L (ref 135–145)

## 2014-11-12 LAB — HEMOGLOBIN A1C
Hgb A1c MFr Bld: 6.2 % — ABNORMAL HIGH (ref <5.7)
Mean Plasma Glucose: 131 mg/dL — ABNORMAL HIGH (ref <117)

## 2014-11-12 LAB — MAGNESIUM: MAGNESIUM: 2 mg/dL (ref 1.5–2.5)

## 2014-11-12 LAB — HEPATIC FUNCTION PANEL
ALBUMIN: 4 g/dL (ref 3.5–5.2)
ALT: 11 U/L (ref 0–35)
AST: 18 U/L (ref 0–37)
Alkaline Phosphatase: 68 U/L (ref 39–117)
Bilirubin, Direct: 0.1 mg/dL (ref 0.0–0.3)
Indirect Bilirubin: 0.3 mg/dL (ref 0.2–1.2)
TOTAL PROTEIN: 7.4 g/dL (ref 6.0–8.3)
Total Bilirubin: 0.4 mg/dL (ref 0.2–1.2)

## 2014-11-12 LAB — VITAMIN B12: Vitamin B-12: 392 pg/mL (ref 211–911)

## 2014-11-12 LAB — INSULIN, RANDOM: INSULIN: 8.2 u[IU]/mL (ref 2.0–19.6)

## 2014-11-12 LAB — MICROALBUMIN / CREATININE URINE RATIO
Creatinine, Urine: 166.9 mg/dL
Microalb Creat Ratio: 18 mg/g (ref 0.0–30.0)
Microalb, Ur: 3 mg/dL — ABNORMAL HIGH (ref <2.0)

## 2014-11-12 LAB — VITAMIN D 25 HYDROXY (VIT D DEFICIENCY, FRACTURES): VIT D 25 HYDROXY: 31 ng/mL (ref 30–100)

## 2014-11-12 MED ORDER — ATORVASTATIN CALCIUM 80 MG PO TABS: 80.0000 mg | ORAL_TABLET | Freq: Every day | ORAL | Status: DC

## 2014-11-14 ENCOUNTER — Other Ambulatory Visit: Payer: Self-pay | Admitting: Emergency Medicine

## 2014-11-15 ENCOUNTER — Other Ambulatory Visit: Payer: Self-pay | Admitting: Internal Medicine

## 2014-11-15 DIAGNOSIS — F411 Generalized anxiety disorder: Secondary | ICD-10-CM

## 2014-11-15 MED ORDER — ALPRAZOLAM 0.5 MG PO TABS: ORAL_TABLET | ORAL | Status: DC

## 2014-11-15 NOTE — Progress Notes (Signed)
Called Rx into [place]. [place]

## 2014-11-18 ENCOUNTER — Other Ambulatory Visit: Payer: Self-pay | Admitting: Internal Medicine

## 2014-11-18 DIAGNOSIS — F411 Generalized anxiety disorder: Secondary | ICD-10-CM

## 2014-11-18 MED ORDER — ALPRAZOLAM 0.5 MG PO TABS: ORAL_TABLET | ORAL | Status: AC

## 2014-11-21 ENCOUNTER — Encounter: Payer: Self-pay | Admitting: Emergency Medicine

## 2015-01-06 ENCOUNTER — Other Ambulatory Visit: Payer: Self-pay | Admitting: Internal Medicine

## 2015-01-13 ENCOUNTER — Other Ambulatory Visit: Payer: Self-pay

## 2015-01-13 MED ORDER — ATENOLOL 50 MG PO TABS: ORAL_TABLET | ORAL | Status: DC

## 2015-02-17 ENCOUNTER — Ambulatory Visit (INDEPENDENT_AMBULATORY_CARE_PROVIDER_SITE_OTHER): Payer: Medicare Other | Admitting: Internal Medicine

## 2015-02-17 ENCOUNTER — Encounter: Payer: Self-pay | Admitting: Internal Medicine

## 2015-02-17 VITALS — BP 110/58 | HR 64 | Temp 97.3°F | Resp 16 | Ht 61.0 in | Wt 121.2 lb

## 2015-02-17 DIAGNOSIS — Z6822 Body mass index (BMI) 22.0-22.9, adult: Secondary | ICD-10-CM

## 2015-02-17 DIAGNOSIS — E785 Hyperlipidemia, unspecified: Secondary | ICD-10-CM | POA: Diagnosis not present

## 2015-02-17 DIAGNOSIS — E559 Vitamin D deficiency, unspecified: Secondary | ICD-10-CM | POA: Diagnosis not present

## 2015-02-17 DIAGNOSIS — R7309 Other abnormal glucose: Secondary | ICD-10-CM

## 2015-02-17 DIAGNOSIS — Z6820 Body mass index (BMI) 20.0-20.9, adult: Secondary | ICD-10-CM | POA: Insufficient documentation

## 2015-02-17 DIAGNOSIS — R7303 Prediabetes: Secondary | ICD-10-CM | POA: Diagnosis not present

## 2015-02-17 DIAGNOSIS — Z9114 Patient's other noncompliance with medication regimen: Secondary | ICD-10-CM

## 2015-02-17 DIAGNOSIS — I10 Essential (primary) hypertension: Secondary | ICD-10-CM | POA: Diagnosis not present

## 2015-02-17 DIAGNOSIS — Z79899 Other long term (current) drug therapy: Secondary | ICD-10-CM

## 2015-02-17 MED ORDER — HYDROCODONE-ACETAMINOPHEN 5-325 MG PO TABS: 1.0000 | ORAL_TABLET | Freq: Four times a day (QID) | ORAL | Status: DC | PRN

## 2015-02-17 NOTE — Patient Instructions (Signed)
Recommend Adult Low Dose Aspirin or   coated  Aspirin 81 mg daily   To reduce risk of Colon Cancer 20 %,   Skin Cancer 26 % ,   Melanoma 46%   and   Pancreatic cancer 60%   ++++++++++++++++++++++++++++++++++++++++++++++++++++++  Vitamin D goal   is between 70-100.   Please make sure that you are taking your Vitamin D as directed.   It is very important as a natural anti-inflammatory   helping hair, skin, and nails, as well as reducing stroke and heart attack risk.   It helps your bones and helps with mood.  It also decreases numerous cancer risks so please take it as directed.   Low Vit D is associated with a 200-300% higher risk for CANCER   and 200-300% higher risk for HEART   ATTACK  &  STROKE.   ......................................  It is also associated with higher death rate at younger ages,   autoimmune diseases like Rheumatoid arthritis, Lupus, Multiple Sclerosis.     Also many other serious conditions, like depression, Alzheimer's  Dementia, infertility, muscle aches, fatigue, fibromyalgia - just to name a few.  ++++++++++++++++++++++++++++++++++++++++++++++++  Recommend the book "The END of DIETING" by Dr Joel Fuhrman   & the book "The END of DIABETES " by Dr Joel Fuhrman  At [employer] - get book & Audio CD's     Being diabetic has a  300% increased risk for heart attack, stroke, cancer, and alzheimer- type vascular dementia. It is very important that you work harder with diet by avoiding all foods that are white. Avoid white rice (brown & wild rice is OK), white potatoes (sweetpotatoes in moderation is OK), White bread or wheat bread or anything made out of white flour like bagels, donuts, rolls, buns, biscuits, cakes, pastries, cookies, pizza crust, and pasta (made from white flour & egg whites) - vegetarian pasta or spinach or wheat pasta is OK. Multigrain breads like Arnold's or Pepperidge Farm, or multigrain sandwich thins or flatbreads.  Diet,  exercise and weight loss can reverse and cure diabetes in the early stages.  Diet, exercise and weight loss is very important in the control and prevention of complications of diabetes which affects every system in your body, ie. Brain - dementia/stroke, eyes - glaucoma/blindness, heart - heart attack/heart failure, kidneys - dialysis, stomach - gastric paralysis, intestines - malabsorption, nerves - severe painful neuritis, circulation - gangrene & loss of a leg(s), and finally cancer and Alzheimers.    I recommend avoid fried & greasy foods,  sweets/candy, white rice (brown or wild rice or Quinoa is OK), white potatoes (sweet potatoes are OK) - anything made from white flour - bagels, doughnuts, rolls, buns, biscuits,white and wheat breads, pizza crust and traditional pasta made of white flour & egg white(vegetarian pasta or spinach or wheat pasta is OK).  Multi-grain bread is OK - like multi-grain flat bread or sandwich thins. Avoid alcohol in excess. Exercise is also important.    Eat all the vegetables you want - avoid meat, especially red meat and dairy - especially cheese.  Cheese is the most concentrated form of trans-fats which is the worst thing to clog up our arteries. Veggie cheese is OK which can be found in the fresh produce section at [employer] or [employer] or [employer]  ++++++++++++++++++++++++++++++++++++++++++++++++++ DASH Eating Plan  DASH stands for "Dietary Approaches to Stop Hypertension."   The DASH eating plan is a healthy eating plan that has been shown to reduce high   blood pressure (hypertension). Additional health benefits [place] include reducing the risk of type 2 diabetes mellitus, heart disease, and stroke. The DASH eating plan Poet also help with weight loss.  WHAT DO I NEED TO KNOW ABOUT THE DASH EATING PLAN? For the DASH eating plan, you will follow these general guidelines:  Choose foods with a percent daily value for sodium of less than 5% (as listed on the food  label).  Use salt-free seasonings or herbs instead of table salt or sea salt.  Check with your health care provider or pharmacist before using salt substitutes.  Eat lower-sodium products, often labeled as "lower sodium" or "no salt added."  Eat fresh foods.  Eat more vegetables, fruits, and low-fat dairy products.    Choose whole grains. Look for the word "whole" as the first word in the ingredient list.  Choose fish   Limit sweets, desserts, sugars, and sugary drinks.  Choose heart-healthy fats.  Eat veggie cheese   Eat more home-cooked food and less restaurant, buffet, and fast food.  Limit fried foods.  Cook foods using methods other than frying.  Limit canned vegetables. If you do use them, rinse them well to decrease the sodium.  When eating at a restaurant, ask that your food be prepared with less salt, or no salt if possible.                      WHAT FOODS CAN I EAT?  Seek help from a dietitian for individual calorie needs. Grains Whole grain or whole wheat bread. Brown rice. Whole grain or whole wheat pasta. Quinoa, bulgur, and whole grain cereals. Low-sodium cereals. Corn or whole wheat flour tortillas. Whole grain cornbread. Whole grain crackers. Low-sodium crackers.  Vegetables Fresh or frozen vegetables (raw, steamed, roasted, or grilled). Low-sodium or reduced-sodium tomato and vegetable juices. Low-sodium or reduced-sodium tomato sauce and paste. Low-sodium or reduced-sodium canned vegetables.   Fruits All fresh, canned (in natural juice), or frozen fruits.  Meat and Other Protein Products  All fish and seafood.  Dried beans, peas, or lentils. Unsalted nuts and seeds. Unsalted canned beans. Dairy Low-fat dairy products, such as skim or 1% milk, 2% or reduced-fat cheeses, low-fat ricotta or cottage cheese, or plain low-fat yogurt. Low-sodium or reduced-sodium cheeses.  Fats and Oils Tub margarines without trans fats. Light or reduced-fat mayonnaise  and salad dressings (reduced sodium). Avocado. Safflower, olive, or canola oils. Natural peanut or almond butter.  Other Unsalted popcorn and pretzels. The items listed above [place] not be a complete list of recommended foods or beverages. Contact your dietitian for more options.  +++++++++++++++++++++++++++++++++++++++++++  WHAT FOODS ARE NOT RECOMMENDED?  Grains/ White flour or wheat flour  White bread. White pasta. White rice. Refined cornbread. Bagels and croissants. Crackers that contain trans fat.  Vegetables  Creamed or fried vegetables. Vegetables in a . Regular canned vegetables. Regular canned tomato sauce and paste. Regular tomato and vegetable juices.  Fruits Dried fruits. Canned fruit in light or heavy syrup. Fruit juice.  Meat and Other Protein Products Meat in general. Fatty cuts of meat. Ribs, chicken wings, bacon, sausage, bologna, salami, chitterlings, fatback, hot dogs, bratwurst, and packaged luncheon meats. Salted nuts and seeds. Canned beans with salt.  Dairy Whole or 2% milk, cream, half-and-half, and cream cheese. Whole-fat or sweetened yogurt. Full-fat cheeses or blue cheese. Nondairy creamers and whipped toppings. Processed cheese, cheese spreads, or cheese curds.  Condiments Onion and garlic salt, seasoned salt, table salt, and sea  salt. Canned and packaged gravies. Worcestershire sauce. Tartar sauce. Barbecue sauce. Teriyaki sauce. Soy sauce, including reduced sodium. Steak sauce. Fish sauce. Oyster sauce. Cocktail sauce. Horseradish. Ketchup and mustard. Meat flavorings and tenderizers. Bouillon cubes. Hot sauce. Tabasco sauce. Marinades. Taco seasonings. Relishes.  Fats and Oils Butter, stick margarine, lard, shortening, ghee, and bacon fat. Coconut, palm kernel, or palm oils. Regular salad dressings.  Pickles and olives. Salted popcorn and pretzels. The items listed above may not be a complete list of foods and beverages to avoid.

## 2015-02-17 NOTE — Progress Notes (Signed)
Patient ID: Stacy [name], female   DOB: August 10, 1945, 69 y.o.   MRN: [id number]   This very nice 69 y.o. MWF presents for 3 month follow up with Hypertension, Hyperlipidemia, Pre-Diabetes and Vitamin D Deficiency.    Patient is treated for HTN since 1995  & BP has been controlled at home. Today's BP: (!) 110/58 mmHg. Patient has had no complaints of any cardiac type chest pain, palpitations, dyspnea/orthopnea/PND, dizziness, claudication, or dependent edema.   Hyperlipidemia is not controlled with diet & meds. Patient denies myalgias or other med SE's. Last Lipids were not at goal off meds with  Cholesterol 322; HDL 65; LDL 202; and also elevatedTriglycerides 276 on 11/11/2014.    Also, the patient has history of PreDiabetes since 2010 and has had no symptoms of reactive hypoglycemia, diabetic polys, paresthesias or visual blurring.  Last A1c was 6.2% on 11/11/2014.   Further, the patient also has history of Vitamin D Deficiency of "7" in 2009 and supplements vitamin D erratically and last vitamin D was still very low at  31 on 11/11/2014.  Medication Sig  . ALPRAZolam  0.5 MG tablet Take 1/2 to 1 tablet 2 x daily if needed for anxiety or sleep  . aspirin 81 MG tablet Take 81 mg by mouth daily.  Marland Kitchen atenolol  50 MG tablet TAKE ONE-HALF TO ONE TABLET BY MOUTH ONCE DAILY FOR  BLOOD  PRESSURE  . atorvastatin  80 MG tablet Take 1 tablet (80 mg total) by mouth daily.  Marland Kitchen CALCIUM-VIT D 500-200 MG-UNIT  Take 1 tablet by mouth 2 (two) times daily.  Marland Kitchen VITAMIN D  Take 4,000 Units by mouth daily.  . vitamin C  500 MG tablet Take 500 mg by mouth daily.  . Vitamin E 200 UNITS TABS Take by mouth.   Allergies  Allergen Reactions  . Dilaudid [Hydromorphone Hcl] Swelling  . Crestor [Rosuvastatin]   . Fentanyl   . Penicillins    PMHx:   Past Medical History  Diagnosis Date  . Osteopenia   . Colon polyps     with divertirticulitis  . Hypertension   . Hyperlipidemia   . Vitamin D deficiency   .  Prediabetes   . DJD (degenerative joint disease)   . Carotid bruit     Bilateral   Immunization History  Administered Date(s) Administered  . Influenza-Unspecified 01/31/2014, 01/02/2015  . Pneumococcal Polysaccharide-23 08/21/2012  . Td 05/04/2003  . Tdap 11/08/2013   Past Surgical History  Procedure Laterality Date  . Neck surgery  2012     c4 -5/6  fushion  . Laminectomy  2002    L 5   . Hand surgery    . Trigger finger release    . Head injury  1995   FHx:    Reviewed / unchanged  SHx:    Reviewed / unchanged  Systems Review:  Constitutional: Denies fever, chills, wt changes, headaches, insomnia, fatigue, night sweats, change in appetite. Eyes: Denies redness, blurred vision, diplopia, discharge, itchy, watery eyes.  ENT: Denies discharge, congestion, post nasal drip, epistaxis, sore throat, earache, hearing loss, dental pain, tinnitus, vertigo, sinus pain, snoring.  CV: Denies chest pain, palpitations, irregular heartbeat, syncope, dyspnea, diaphoresis, orthopnea, PND, claudication or edema. Respiratory: denies cough, dyspnea, DOE, pleurisy, hoarseness, laryngitis, wheezing.  Gastrointestinal: Denies dysphagia, odynophagia, heartburn, reflux, water brash, abdominal pain or cramps, nausea, vomiting, bloating, diarrhea, constipation, hematemesis, melena, hematochezia  or hemorrhoids. Genitourinary: Denies dysuria, frequency, urgency, nocturia, hesitancy, discharge, hematuria or flank pain.  Musculoskeletal: Denies arthralgias, myalgias, stiffness, jt. swelling, pain, limping or strain/sprain.  Skin: Denies pruritus, rash, hives, warts, acne, eczema or change in skin lesion(s). Neuro: No weakness, tremor, incoordination, spasms, paresthesia or pain. Psychiatric: Denies confusion, memory loss or sensory loss. Endo: Denies change in weight, skin or hair change.  Heme/Lymph: No excessive bleeding, bruising or enlarged lymph nodes.  Physical Exam  BP 110/58 mmHg  Pulse 64   Temp(Src) 97.3 F (36.3 C)  Resp 16  Ht '5\' 1"'$  (1.549 m)  Wt 121 lb 3.2 oz (54.976 kg)  BMI 22.91 kg/m2  Appears well nourished and in no distress. Eyes: PERRLA, EOMs, conjunctiva no swelling or erythema. Sinuses: No frontal/maxillary tenderness ENT/Mouth: EAC's clear, TM's nl w/o erythema, bulging. Nares clear w/o erythema, swelling, exudates. Oropharynx clear without erythema or exudates. Oral hygiene is good. Tongue normal, non obstructing. Hearing intact.  Neck: Supple. Thyroid nl. Car 2+/2+ without bruits, nodes or JVD. Chest: Respirations nl with BS clear & equal w/o rales, rhonchi, wheezing or stridor.  Cor: Heart sounds normal w/ regular rate and rhythm without sig. murmurs, gallops, clicks, or rubs. Peripheral pulses normal and equal  without edema.  Abdomen: Soft & bowel sounds normal. Non-tender w/o guarding, rebound, hernias, masses, or organomegaly.  Lymphatics: Unremarkable.  Musculoskeletal: Full ROM all peripheral extremities, joint stability, 5/5 strength, and normal gait.  Skin: Warm, dry without exposed rashes, lesions or ecchymosis apparent.  Neuro: Cranial nerves intact, reflexes equal bilaterally. Sensory-motor testing grossly intact. Tendon reflexes grossly intact.  Pysch: Alert & oriented x 3.  Insight and judgement nl & appropriate. No ideations.  Assessment and Plan:  1. Essential hypertension  - TSH; Future  2. Hyperlipidemia  - Lipid panel; Future  3. Prediabetes  - Hemoglobin A1c; Future - Insulin, random; Future  4. Vitamin D deficiency  - Vit D  25 hydroxy   5. Body mass index (BMI) of 22.90 in adult   6. Medication management  - CBC with Differential/Platelet; Future - BASIC METABOLIC PANEL WITH GFR; Future - Hepatic function panel; Future - Magnesium; Future - Lipid panel; Future  7. Other abnormal glucose - Hemoglobin A1c; Future   Recommended regular exercise, BP monitoring, weight control, and discussed med and SE's. Recommended  labs to assess and monitor clinical status. Further disposition pending results of labs. Over 30 minutes of exam, counseling, chart review was performed

## 2015-02-24 ENCOUNTER — Other Ambulatory Visit: Payer: Self-pay | Admitting: Internal Medicine

## 2015-03-04 ENCOUNTER — Telehealth: Payer: Self-pay | Admitting: *Deleted

## 2015-03-04 NOTE — Telephone Encounter (Signed)
-----   Message from [name], MD sent at 02/24/2015  7:49 PM EDT ----- - patient needs to return for labs missed at 02/17/15 OV  ----- Message -----    From: SYSTEM    Sent: 02/24/2015  12:04 AM      To: [name], MD

## 2015-03-04 NOTE — Telephone Encounter (Signed)
Called patient to schedule her to return for labs she did not get at her last visit, per Dr Melford Aase.  Patient scheduled an appointment.

## 2015-03-11 ENCOUNTER — Other Ambulatory Visit: Payer: Medicare Other

## 2015-03-11 ENCOUNTER — Other Ambulatory Visit: Payer: Self-pay | Admitting: Internal Medicine

## 2015-03-11 DIAGNOSIS — D539 Nutritional anemia, unspecified: Secondary | ICD-10-CM

## 2015-03-11 DIAGNOSIS — I10 Essential (primary) hypertension: Secondary | ICD-10-CM

## 2015-03-11 DIAGNOSIS — R7309 Other abnormal glucose: Secondary | ICD-10-CM | POA: Diagnosis not present

## 2015-03-11 DIAGNOSIS — D519 Vitamin B12 deficiency anemia, unspecified: Secondary | ICD-10-CM

## 2015-03-11 DIAGNOSIS — Z79899 Other long term (current) drug therapy: Secondary | ICD-10-CM | POA: Diagnosis not present

## 2015-03-11 DIAGNOSIS — E785 Hyperlipidemia, unspecified: Secondary | ICD-10-CM | POA: Diagnosis not present

## 2015-03-11 LAB — BASIC METABOLIC PANEL WITH GFR
BUN: 10 mg/dL (ref 7–25)
CALCIUM: 10.1 mg/dL (ref 8.6–10.4)
CO2: 27 mmol/L (ref 20–31)
Chloride: 99 mmol/L (ref 98–110)
Creat: 0.67 mg/dL (ref 0.60–0.93)
GFR, EST NON AFRICAN AMERICAN: 89 mL/min (ref >=60)
GLUCOSE: 98 mg/dL (ref 65–99)
POTASSIUM: 4.2 mmol/L (ref 3.5–5.3)
SODIUM: 138 mmol/L (ref 135–146)

## 2015-03-11 LAB — HEMOGLOBIN A1C
Hgb A1c MFr Bld: 6.1 % — ABNORMAL HIGH (ref <5.7)
Mean Plasma Glucose: 128 mg/dL — ABNORMAL HIGH (ref <117)

## 2015-03-11 LAB — CBC WITH DIFFERENTIAL/PLATELET
BASOS ABS: 0 10*3/uL (ref 0.0–0.1)
BASOS PCT: 0 % (ref 0–1)
Eosinophils Absolute: 0.2 10*3/uL (ref 0.0–0.7)
Eosinophils Relative: 3 % (ref 0–5)
HEMATOCRIT: 44.8 % (ref 36.0–46.0)
Hemoglobin: 14.7 g/dL (ref 12.0–15.0)
LYMPHS ABS: 2.8 10*3/uL (ref 0.7–4.0)
LYMPHS PCT: 43 % (ref 12–46)
MCH: 33 pg (ref 26.0–34.0)
MCHC: 32.8 g/dL (ref 30.0–36.0)
MCV: 100.7 fL — ABNORMAL HIGH (ref 78.0–100.0)
MPV: 10.7 fL (ref 8.6–12.4)
Monocytes Absolute: 0.4 10*3/uL (ref 0.1–1.0)
Monocytes Relative: 7 % (ref 3–12)
NEUTROS ABS: 3 10*3/uL (ref 1.7–7.7)
NEUTROS PCT: 47 % (ref 43–77)
PLATELETS: 224 10*3/uL (ref 150–400)
RBC: 4.45 MIL/uL (ref 3.87–5.11)
RDW: 12.8 % (ref 11.5–15.5)
WBC: 6.4 10*3/uL (ref 4.0–10.5)

## 2015-03-11 LAB — HEPATIC FUNCTION PANEL
ALK PHOS: 89 U/L (ref 33–130)
ALT: 9 U/L (ref 6–29)
AST: 17 U/L (ref 10–35)
Albumin: 4.2 g/dL (ref 3.6–5.1)
BILIRUBIN DIRECT: 0.1 mg/dL (ref <=0.2)
BILIRUBIN INDIRECT: 0.2 mg/dL (ref 0.2–1.2)
BILIRUBIN TOTAL: 0.3 mg/dL (ref 0.2–1.2)
Total Protein: 7.5 g/dL (ref 6.1–8.1)

## 2015-03-11 LAB — LIPID PANEL
CHOL/HDL RATIO: 4.9 ratio (ref <=5.0)
CHOLESTEROL: 282 mg/dL — AB (ref 125–200)
HDL: 57 mg/dL (ref >=46)
LDL Cholesterol: 166 mg/dL — ABNORMAL HIGH (ref <130)
Triglycerides: 294 mg/dL — ABNORMAL HIGH (ref <150)
VLDL: 59 mg/dL — AB (ref <30)

## 2015-03-11 LAB — VITAMIN B12: Vitamin B-12: 349 pg/mL (ref 211–911)

## 2015-03-11 LAB — TSH: TSH: 1.693 u[IU]/mL (ref 0.350–4.500)

## 2015-03-11 LAB — MAGNESIUM: Magnesium: 2.2 mg/dL (ref 1.5–2.5)

## 2015-03-12 ENCOUNTER — Other Ambulatory Visit: Payer: Self-pay | Admitting: Internal Medicine

## 2015-03-12 DIAGNOSIS — E782 Mixed hyperlipidemia: Secondary | ICD-10-CM

## 2015-03-12 LAB — INSULIN, RANDOM: Insulin: 9 u[IU]/mL (ref 2.0–19.6)

## 2015-03-12 MED ORDER — EZETIMIBE 10 MG PO TABS: 10.0000 mg | ORAL_TABLET | Freq: Every day | ORAL | Status: DC

## 2015-03-17 ENCOUNTER — Telehealth: Payer: Self-pay | Admitting: *Deleted

## 2015-03-17 NOTE — Telephone Encounter (Signed)
Mailed labs to patient at her request.

## 2015-11-11 ENCOUNTER — Encounter: Payer: Self-pay | Admitting: Internal Medicine

## 2015-11-11 ENCOUNTER — Ambulatory Visit (INDEPENDENT_AMBULATORY_CARE_PROVIDER_SITE_OTHER): Payer: Medicare Other | Admitting: Internal Medicine

## 2015-11-11 VITALS — BP 122/70 | HR 74 | Temp 97.7°F | Resp 16 | Ht 61.0 in | Wt 116.0 lb

## 2015-11-11 DIAGNOSIS — R6889 Other general symptoms and signs: Secondary | ICD-10-CM | POA: Diagnosis not present

## 2015-11-11 DIAGNOSIS — Z0001 Encounter for general adult medical examination with abnormal findings: Secondary | ICD-10-CM

## 2015-11-11 DIAGNOSIS — I10 Essential (primary) hypertension: Secondary | ICD-10-CM | POA: Diagnosis not present

## 2015-11-11 DIAGNOSIS — E782 Mixed hyperlipidemia: Secondary | ICD-10-CM | POA: Diagnosis not present

## 2015-11-11 DIAGNOSIS — Z6822 Body mass index (BMI) 22.0-22.9, adult: Secondary | ICD-10-CM | POA: Diagnosis not present

## 2015-11-11 DIAGNOSIS — E559 Vitamin D deficiency, unspecified: Secondary | ICD-10-CM

## 2015-11-11 DIAGNOSIS — Z0000 Encounter for general adult medical examination without abnormal findings: Secondary | ICD-10-CM | POA: Diagnosis not present

## 2015-11-11 DIAGNOSIS — R7303 Prediabetes: Secondary | ICD-10-CM | POA: Diagnosis not present

## 2015-11-11 DIAGNOSIS — M150 Primary generalized (osteo)arthritis: Secondary | ICD-10-CM

## 2015-11-11 DIAGNOSIS — Z79899 Other long term (current) drug therapy: Secondary | ICD-10-CM

## 2015-11-11 DIAGNOSIS — E785 Hyperlipidemia, unspecified: Secondary | ICD-10-CM

## 2015-11-11 DIAGNOSIS — M159 Polyosteoarthritis, unspecified: Secondary | ICD-10-CM

## 2015-11-11 LAB — CBC WITH DIFFERENTIAL/PLATELET
BASOS ABS: 0 {cells}/uL (ref 0–200)
Basophils Relative: 0 %
EOS PCT: 3 %
Eosinophils Absolute: 222 cells/uL (ref 15–500)
HCT: 44.2 % (ref 35.0–45.0)
Hemoglobin: 14.7 g/dL (ref 11.7–15.5)
LYMPHS ABS: 2812 {cells}/uL (ref 850–3900)
Lymphocytes Relative: 38 %
MCH: 33 pg (ref 27.0–33.0)
MCHC: 33.3 g/dL (ref 32.0–36.0)
MCV: 99.3 fL (ref 80.0–100.0)
MONOS PCT: 6 %
MPV: 10.3 fL (ref 7.5–12.5)
Monocytes Absolute: 444 cells/uL (ref 200–950)
NEUTROS ABS: 3922 {cells}/uL (ref 1500–7800)
Neutrophils Relative %: 53 %
PLATELETS: 252 10*3/uL (ref 140–400)
RBC: 4.45 MIL/uL (ref 3.80–5.10)
RDW: 13.3 % (ref 11.0–15.0)
WBC: 7.4 10*3/uL (ref 3.8–10.8)

## 2015-11-11 LAB — HEMOGLOBIN A1C
Hgb A1c MFr Bld: 5.6 % (ref <5.7)
MEAN PLASMA GLUCOSE: 114 mg/dL

## 2015-11-11 MED ORDER — EZETIMIBE 10 MG PO TABS: 10.0000 mg | ORAL_TABLET | Freq: Every day | ORAL | Status: DC

## 2015-11-11 MED ORDER — HYDROCODONE-ACETAMINOPHEN 5-325 MG PO TABS: 1.0000 | ORAL_TABLET | Freq: Four times a day (QID) | ORAL | Status: DC | PRN

## 2015-11-11 NOTE — Progress Notes (Signed)
MEDICARE ANNUAL WELLNESS VISIT AND CPE  Assessment:    1. Mixed hyperlipidemia -cont statin - ezetimibe (ZETIA) 10 MG tablet; Take 1 tablet (10 mg total) by mouth daily.  Dispense: 90 tablet; Refill: 3 - Lipid panel  2. Essential hypertension -not on meds -cont to monitor - Urinalysis, Routine w reflex microscopic (not at [facility]) - Microalbumin / creatinine urine ratio - EKG 12-Lead - [place], RETROPERITNL ABD,  LTD - TSH  3. Hyperlipidemia -cont statin and zetia -lipid panel  4. Prediabetes  - Hemoglobin A1c - Insulin, random  5. Vitamin D deficiency -cont supplement - VITAMIN D 25 Hydroxy (Vit-D Deficiency, Fractures)  6. Medication management -cont yearly labs  7. Primary osteoarthritis involving multiple joints -Norco #30 given -no refills  8. Body mass index (BMI) of 22.90 in adult -cont healthy diet and exercise  9. Routine general medical examination at a health care facility  - CBC with Differential/Platelet - BASIC METABOLIC PANEL WITH GFR - Hepatic function panel - Magnesium     Over 40 minutes of exam, counseling, chart review and critical decision making was performed Future Appointments Date Time Provider [place]  11/10/2016 2:00 PM Benford Asch Forcucci, PA-C [id number] None     Plan:   During the course of the visit the patient was educated and counseled about appropriate screening and preventive services including:    Pneumococcal vaccine   Prevnar 13  Influenza vaccine  Td vaccine  Screening electrocardiogram  Bone densitometry screening  Colorectal cancer screening  Diabetes screening  Glaucoma screening  Nutrition counseling   Advanced directives: requested   Subjective:  Stacy [name] is a 70 y.o. female who presents for Medicare Annual Wellness Visit and complete physical.    She has had elevated blood pressure for several years controlled with diet and exercise. Her blood pressure has been controlled at  home, today their BP is BP: 122/70 mmHg She does workout. She denies chest pain, shortness of breath, dizziness.   She works out in the yard and also does some walking everyday.  Her granddaughter reports that she is active for at least 4 hours per day.    She is on cholesterol medication and denies myalgias. Her cholesterol is not at goal. The cholesterol last visit was:  Lab Results  Component Value Date   CHOL 282* 03/11/2015   HDL 57 03/11/2015   LDLCALC 166* 03/11/2015   TRIG 294* 03/11/2015   CHOLHDL 4.9 03/11/2015  She was unaware that she is supposed to be taking zetia.  She does watch her diet as she has asymptomatic prediabetes.  She reports that she is cutting out sweets and starches if she can because she knows her familial hyperlipidemia cannot tolerate this.    Lab Results  Component Value Date   HGBA1C 6.1* 03/11/2015   Last GFR:   Lab Results  Component Value Date   GFRNONAA 89 03/11/2015   Lab Results  Component Value Date   GFRAA >89 03/11/2015   Patient is on Vitamin D supplement.   Lab Results  Component Value Date   VD25OH 31 11/11/2014     She has no complaints.  She does want to have a DNR form filled out.    She is not seeing obgyn.    She is not interested in having a bone density study done this year.   She is overdue on her mammogram.    She does not want another colonoscopy.  She is interested in cologuard.  Medication Review: Current Outpatient Prescriptions on File Prior to Visit  Medication Sig Dispense Refill  . aspirin 81 MG tablet Take 81 mg by mouth daily.    Marland Kitchen atenolol (TENORMIN) 50 MG tablet TAKE ONE-HALF TO ONE TABLET BY MOUTH ONCE DAILY FOR  BLOOD  PRESSURE 90 tablet 3  . atorvastatin (LIPITOR) 80 MG tablet Take 1 tablet (80 mg total) by mouth daily. 30 tablet 3  . Calcium Carbonate-Vitamin D (CALCIUM-VITAMIN D) 500-200 MG-UNIT per tablet Take 1 tablet by mouth 2 (two) times daily.    . cholecalciferol (VITAMIN D) 1000 UNITS  tablet Take 4,000 Units by mouth daily.    Marland Kitchen HYDROcodone-acetaminophen (NORCO/VICODIN) 5-325 MG tablet Take 1 tablet by mouth every 6 (six) hours as needed for moderate pain. 30 tablet 0  . vitamin C (ASCORBIC ACID) 500 MG tablet Take 500 mg by mouth daily.    . Vitamin E 200 UNITS TABS Take by mouth.     No current facility-administered medications on file prior to visit.    Allergies  Allergen Reactions  . Dilaudid [Hydromorphone Hcl] Swelling  . Crestor [Rosuvastatin]   . Fentanyl   . Penicillins     Current Problems (verified) Patient Active Problem List   Diagnosis Date Noted  . Body mass index (BMI) of 22.90 in adult 02/17/2015  . Poor compliance with medication 02/17/2015  . Medication management 06/10/2014  . Hypertension   . Hyperlipidemia   . Vitamin D deficiency   . Prediabetes   . DJD (degenerative joint disease)     Screening Tests Immunization History  Administered Date(s) Administered  . Influenza-Unspecified 01/31/2014, 01/02/2015  . Pneumococcal Polysaccharide-23 08/21/2012  . Td 05/04/2003  . Tdap 11/08/2013    Preventative care: Last colonoscopy: 2011, cologuard ordered today Last mammogram: 2015 Last pap smear/pelvic exam:  DEXA: 2015  Prior vaccinations: TD or Tdap: 2015  Influenza: 2016 Pneumococcal: 2014 Prevnar13:  Shingles/Zostavax:   Names of Other Physician/Practitioners you currently use: 1. [facility] here for primary care 2. Dr. Ronnald Ramp, eye doctor, last visit 2016 3. Dr. Lynnette Caffey, dentist, last visit 2017 Patient Care Team: [name], MD as PCP - General (Internal Medicine) Etta Quill, OD as Referring Physician (Optometry) Jackqulyn Livings, DDS (Dentistry) Theodis Sato, MD as Consulting Physician (Orthopedic Surgery) Marylynn Pearson, MD as Consulting Physician (Obstetrics and Gynecology) Juanita Craver, MD as Consulting Physician (Gastroenterology)  SURGICAL HISTORY She  has past  surgical history that includes Neck surgery (2012 ); Laminectomy (2002); Hand surgery; Trigger finger release; and head injury (1995). FAMILY HISTORY Her family history includes Cancer - Other in her father and mother; Diabetes in her mother; Heart attack (age of onset: 3) in her father; Heart attack (age of onset: 38) in her mother; Heart disease in her mother; Hyperlipidemia in her mother; Hypertension in her father. SOCIAL HISTORY She  reports that she has been smoking.  She does not have any smokeless tobacco history on file. She reports that she does not drink alcohol.   MEDICARE WELLNESS OBJECTIVES: Physical activity:   Cardiac risk factors:   Depression/mood screen:   Depression screen [facility] 2/9 11/11/2014  Decreased Interest 0  Down, Depressed, Hopeless 0  PHQ - 2 Score 0    ADLs:  In your present state of health, do you have any difficulty performing the following activities: 11/11/2014  Hearing? N  Vision? N  Difficulty concentrating or making decisions? N  Walking or climbing stairs? N  Dressing or bathing? N  Doing errands, shopping? N  Preparing Food and eating ? N  Using the Toilet? N  In the past six months, have you accidently leaked urine? N  Do you have problems with loss of bowel control? N  Managing your Medications? N  Managing your Finances? N  Housekeeping or managing your Housekeeping? N     Cognitive Testing  Alert? Yes  Normal Appearance?Yes  Oriented to person? Yes  Place? Yes   Time? Yes  Recall of three objects?  Yes  Can perform simple calculations? Yes  Displays appropriate judgment?Yes  Can read the correct time from a watch face?Yes  EOL planning:    Review of Systems  Constitutional: Negative for fever, chills and malaise/fatigue.  HENT: Negative for congestion, ear pain and sore throat.   Eyes: Negative.   Respiratory: Negative for cough, shortness of breath and wheezing.   Cardiovascular: Negative for chest pain, palpitations and leg  swelling.  Gastrointestinal: Negative for heartburn, abdominal pain, diarrhea, constipation, blood in stool and melena.  Genitourinary: Negative.   Skin: Negative.   Neurological: Negative for dizziness, sensory change, loss of consciousness and headaches.  Psychiatric/Behavioral: Negative for depression. The patient is not nervous/anxious and does not have insomnia.      Objective:     Today's Vitals   11/11/15 1339  BP: 122/70  Pulse: 74  Temp: 97.7 F (36.5 C)  TempSrc: Temporal  Resp: 16  Height: '5\' 1"'$  (1.549 m)  Weight: 116 lb (52.617 kg)  SpO2: 96%   Body mass index is 21.93 kg/(m^2).  General appearance: alert, no distress, WD/WN, female HEENT: normocephalic, sclerae anicteric, TMs pearly, nares patent, no discharge or erythema, pharynx normal Oral cavity: MMM, no lesions Neck: supple, no lymphadenopathy, no thyromegaly, no masses Heart: RRR, normal S1, S2, no murmurs Lungs: CTA bilaterally, no wheezes, rhonchi, or rales Abdomen: +bs, soft, non tender, non distended, no masses, no hepatomegaly, no splenomegaly Musculoskeletal: nontender, no swelling, no obvious deformity Extremities: no edema, no cyanosis, no clubbing Pulses: 2+ symmetric, upper and lower extremities, normal cap refill Neurological: alert, oriented x 3, CN2-12 intact, strength normal upper extremities and lower extremities, sensation normal throughout, DTRs 2+ throughout, no cerebellar signs, gait normal Psychiatric: normal affect, behavior normal, pleasant   Medicare Attestation I have personally reviewed: The patient's medical and social history Their use of alcohol, tobacco or illicit drugs Their current medications and supplements The patient's functional ability including ADLs,fall risks, home safety risks, cognitive, and hearing and visual impairment Diet and physical activities Evidence for depression or mood disorders  The patient's weight, height, BMI, and visual acuity have been  recorded in the chart.  I have made referrals, counseling, and provided education to the patient based on review of the above and I have provided the patient with a written personalized care plan for preventive services.     Starlyn Skeans, PA-C   11/11/2015

## 2015-11-12 LAB — HEPATIC FUNCTION PANEL
ALK PHOS: 85 U/L (ref 33–130)
ALT: 9 U/L (ref 6–29)
AST: 17 U/L (ref 10–35)
Albumin: 4.4 g/dL (ref 3.6–5.1)
BILIRUBIN DIRECT: 0.1 mg/dL (ref <=0.2)
Indirect Bilirubin: 0.3 mg/dL (ref 0.2–1.2)
TOTAL PROTEIN: 7.5 g/dL (ref 6.1–8.1)
Total Bilirubin: 0.4 mg/dL (ref 0.2–1.2)

## 2015-11-12 LAB — LIPID PANEL
CHOL/HDL RATIO: 4.2 ratio (ref <=5.0)
CHOLESTEROL: 294 mg/dL — AB (ref 125–200)
HDL: 70 mg/dL (ref >=46)
LDL Cholesterol: 169 mg/dL — ABNORMAL HIGH (ref <130)
Triglycerides: 277 mg/dL — ABNORMAL HIGH (ref <150)
VLDL: 55 mg/dL — ABNORMAL HIGH (ref <30)

## 2015-11-12 LAB — BASIC METABOLIC PANEL WITH GFR
BUN: 10 mg/dL (ref 7–25)
CALCIUM: 10.3 mg/dL (ref 8.6–10.4)
CHLORIDE: 100 mmol/L (ref 98–110)
CO2: 26 mmol/L (ref 20–31)
CREATININE: 0.79 mg/dL (ref 0.60–0.93)
GFR, EST NON AFRICAN AMERICAN: 76 mL/min (ref >=60)
GFR, Est African American: 87 mL/min (ref >=60)
Glucose, Bld: 82 mg/dL (ref 65–99)
Potassium: 4.3 mmol/L (ref 3.5–5.3)
SODIUM: 139 mmol/L (ref 135–146)

## 2015-11-12 LAB — URINALYSIS, ROUTINE W REFLEX MICROSCOPIC
Bilirubin Urine: NEGATIVE
Glucose, UA: NEGATIVE
HGB URINE DIPSTICK: NEGATIVE
Ketones, ur: NEGATIVE
NITRITE: NEGATIVE
PH: 6.5 (ref 5.0–8.0)
PROTEIN: NEGATIVE
Specific Gravity, Urine: 1.014 (ref 1.001–1.035)

## 2015-11-12 LAB — MICROALBUMIN / CREATININE URINE RATIO
Creatinine, Urine: 89 mg/dL (ref 20–320)
MICROALB UR: 1.6 mg/dL
MICROALB/CREAT RATIO: 18 ug/mg{creat} (ref <30)

## 2015-11-12 LAB — INSULIN, RANDOM: Insulin: 6.5 u[IU]/mL (ref 2.0–19.6)

## 2015-11-12 LAB — URINALYSIS, MICROSCOPIC ONLY
CASTS: NONE SEEN [LPF]
Crystals: NONE SEEN [HPF]
RBC / HPF: NONE SEEN RBC/HPF (ref <=2)
YEAST: NONE SEEN [HPF]

## 2015-11-12 LAB — VITAMIN D 25 HYDROXY (VIT D DEFICIENCY, FRACTURES): Vit D, 25-Hydroxy: 32 ng/mL (ref 30–100)

## 2015-11-12 LAB — MAGNESIUM: Magnesium: 2.1 mg/dL (ref 1.5–2.5)

## 2015-11-12 LAB — TSH: TSH: 2.05 mIU/L

## 2015-11-17 ENCOUNTER — Encounter: Payer: Self-pay | Admitting: *Deleted

## 2015-12-09 DIAGNOSIS — H2513 Age-related nuclear cataract, bilateral: Secondary | ICD-10-CM | POA: Diagnosis not present

## 2016-05-18 ENCOUNTER — Ambulatory Visit: Payer: Self-pay | Admitting: Internal Medicine

## 2016-05-28 ENCOUNTER — Ambulatory Visit: Payer: Self-pay | Admitting: Internal Medicine

## 2016-08-26 ENCOUNTER — Other Ambulatory Visit: Payer: Self-pay | Admitting: Internal Medicine

## 2016-11-10 ENCOUNTER — Encounter: Payer: Self-pay | Admitting: Internal Medicine

## 2016-11-15 ENCOUNTER — Other Ambulatory Visit: Payer: Self-pay | Admitting: Internal Medicine

## 2016-12-01 ENCOUNTER — Encounter: Payer: Self-pay | Admitting: Internal Medicine

## 2016-12-01 ENCOUNTER — Ambulatory Visit (INDEPENDENT_AMBULATORY_CARE_PROVIDER_SITE_OTHER): Payer: PPO | Admitting: Internal Medicine

## 2016-12-01 VITALS — BP 120/72 | HR 60 | Temp 97.2°F | Resp 16 | Ht 61.0 in | Wt 112.0 lb

## 2016-12-01 DIAGNOSIS — Z1212 Encounter for screening for malignant neoplasm of rectum: Secondary | ICD-10-CM

## 2016-12-01 DIAGNOSIS — M159 Polyosteoarthritis, unspecified: Secondary | ICD-10-CM

## 2016-12-01 DIAGNOSIS — I10 Essential (primary) hypertension: Secondary | ICD-10-CM | POA: Diagnosis not present

## 2016-12-01 DIAGNOSIS — Z79899 Other long term (current) drug therapy: Secondary | ICD-10-CM | POA: Diagnosis not present

## 2016-12-01 DIAGNOSIS — E782 Mixed hyperlipidemia: Secondary | ICD-10-CM

## 2016-12-01 DIAGNOSIS — Z136 Encounter for screening for cardiovascular disorders: Secondary | ICD-10-CM

## 2016-12-01 DIAGNOSIS — R7303 Prediabetes: Secondary | ICD-10-CM | POA: Diagnosis not present

## 2016-12-01 DIAGNOSIS — E559 Vitamin D deficiency, unspecified: Secondary | ICD-10-CM | POA: Diagnosis not present

## 2016-12-01 DIAGNOSIS — M150 Primary generalized (osteo)arthritis: Secondary | ICD-10-CM

## 2016-12-01 DIAGNOSIS — R7309 Other abnormal glucose: Secondary | ICD-10-CM

## 2016-12-01 DIAGNOSIS — Z23 Encounter for immunization: Secondary | ICD-10-CM | POA: Diagnosis not present

## 2016-12-01 DIAGNOSIS — Z1211 Encounter for screening for malignant neoplasm of colon: Secondary | ICD-10-CM

## 2016-12-01 LAB — CBC WITH DIFFERENTIAL/PLATELET
Basophils Absolute: 0 cells/uL (ref 0–200)
Basophils Relative: 0 %
EOS ABS: 160 {cells}/uL (ref 15–500)
Eosinophils Relative: 2 %
HEMATOCRIT: 44.6 % (ref 35.0–45.0)
Hemoglobin: 14.7 g/dL (ref 11.7–15.5)
LYMPHS PCT: 36 %
Lymphs Abs: 2880 cells/uL (ref 850–3900)
MCH: 33.2 pg — AB (ref 27.0–33.0)
MCHC: 33 g/dL (ref 32.0–36.0)
MCV: 100.7 fL — AB (ref 80.0–100.0)
MONO ABS: 480 {cells}/uL (ref 200–950)
MPV: 10.1 fL (ref 7.5–12.5)
Monocytes Relative: 6 %
NEUTROS PCT: 56 %
Neutro Abs: 4480 cells/uL (ref 1500–7800)
Platelets: 265 10*3/uL (ref 140–400)
RBC: 4.43 MIL/uL (ref 3.80–5.10)
RDW: 13.3 % (ref 11.0–15.0)
WBC: 8 10*3/uL (ref 3.8–10.8)

## 2016-12-01 LAB — TSH: TSH: 1.59 mIU/L

## 2016-12-01 MED ORDER — ATENOLOL 25 MG PO TABS: ORAL_TABLET | ORAL | 1 refills | Status: DC

## 2016-12-01 MED ORDER — EZETIMIBE 10 MG PO TABS: ORAL_TABLET | ORAL | 1 refills | Status: DC

## 2016-12-01 NOTE — Patient Instructions (Signed)

## 2016-12-01 NOTE — Progress Notes (Signed)
[facility] [name], M.D.      Uvaldo Bristle. Silverio Lay, P.A.-C [facility]                [address] [place], [place] [address] Telephone [phone] Telefax [phone]  Annual Screening/Preventative Visit & Comprehensive Evaluation &  Examination     This very nice 71 y.o. DWF presents for a Screening/Preventative Visit & comprehensive evaluation and management of multiple medical co-morbidities.  Patient has been followed for HTN, T2_NIDDM  Prediabetes, Hyperlipidemia and Vitamin D Deficiency.      HTN predates since 1995. Patient's BP has been controlled at home and patient denies any cardiac symptoms as chest pain, palpitations, shortness of breath, dizziness or ankle swelling. Today's BP is at goal - 120/72.      Patient's hyperlipidemia is not controlled with diet as patient has been reticent to take meds for Cholesterol. Patient denies myalgias or other medication SE's. Last lipids were not at goal: Lab Results  Component Value Date   CHOL 294 (H) 11/11/2015   HDL 70 11/11/2015   LDLCALC 169 (H) 11/11/2015   TRIG 277 (H) 11/11/2015   CHOLHDL 4.2 11/11/2015      Patient has prediabetes (A1c 6.4% in 2009)   and patient denies reactive hypoglycemic symptoms, visual blurring, diabetic polys or paresthesias. Last A1c was at goal: Lab Results  Component Value Date   HGBA1C 5.6 11/11/2015      Finally, patient has history of Vitamin D Deficiency ("7" in 2009)  and last Vitamin D was still very low consequent of her not taking recommended supplements: Lab Results  Component Value Date   VD25OH 32 11/11/2015   Current Outpatient Prescriptions on File Prior to Visit  Medication Sig  . aspirin 81 MG tablet Take 81 mg by mouth daily.  Marland Kitchen atenolol (TENORMIN) 50 MG tablet TAKE 1/2 TO 1 (ONE-HALF TO ONE) TABLET BY MOUTH ONCE DAILY FOR BLOOD PRESSURE  . Calcium Carbonate-Vitamin D (CALCIUM-VITAMIN  D) 500-200 MG-UNIT per tablet Take 1 tablet by mouth 2 (two) times daily.  . vitamin C (ASCORBIC ACID) 500 MG tablet Take 500 mg by mouth daily.  . Vitamin E 200 UNITS TABS Take by mouth.   No current facility-administered medications on file prior to visit.    Allergies  Allergen Reactions  . Dilaudid [Hydromorphone Hcl] Swelling  . Crestor [Rosuvastatin]   . Fentanyl   . Penicillins    Past Medical History:  Diagnosis Date  . Carotid bruit    Bilateral  . Colon polyps    with divertirticulitis  . DJD (degenerative joint disease)   . Hyperlipidemia   . Hypertension   . Osteopenia   . Prediabetes   . Vitamin D deficiency    Health Maintenance  Topic Date Due  . PNA vac Low Risk Adult (2 of 2 - PCV13) 08/21/2013  . MAMMOGRAM  12/21/2015  . INFLUENZA VACCINE  12/01/2016  . COLONOSCOPY  03/03/2020  . TETANUS/TDAP  11/09/2023  . DEXA SCAN  Completed   Immunization History  Administered Date(s) Administered  . Influenza-Unspecified 01/31/2014, 01/02/2015  . Pneumococcal Polysaccharide-23 08/21/2012  . Td 05/04/2003  . Tdap 11/08/2013   Past Surgical History:  Procedure Laterality Date  . HAND SURGERY    . head injury  1995  . LAMINECTOMY  2002   L 5   . NECK SURGERY  2012    c4 -5/6  fushion  . TRIGGER FINGER RELEASE     Family History  Problem Relation Age of Onset  . Hyperlipidemia Mother   . Heart disease Mother        7 way bypass/ value replacement/pacemaker  . Heart attack Mother 74  . Diabetes Mother   . Cancer - Other Mother        lung  . Hypertension Father   . Heart attack Father 38  . Cancer - Other Father        throat   Social History  Substance Use Topics  . Smoking status: Current Every Day Smoker  . Smokeless tobacco: Not on file     Comment: smokes 2 cigarettes per day  . Alcohol use No    ROS Constitutional: Denies fever, chills, weight loss/gain, headaches, insomnia,  night sweats, and change in appetite. Does c/o  fatigue. Eyes: Denies redness, blurred vision, diplopia, discharge, itchy, watery eyes.  ENT: Denies discharge, congestion, post nasal drip, epistaxis, sore throat, earache, hearing loss, dental pain, Tinnitus, Vertigo, Sinus pain, snoring.  Cardio: Denies chest pain, palpitations, irregular heartbeat, syncope, dyspnea, diaphoresis, orthopnea, PND, claudication, edema Respiratory: denies cough, dyspnea, DOE, pleurisy, hoarseness, laryngitis, wheezing.  Gastrointestinal: Denies dysphagia, heartburn, reflux, water brash, pain, cramps, nausea, vomiting, bloating, diarrhea, constipation, hematemesis, melena, hematochezia, jaundice, hemorrhoids Genitourinary: Denies dysuria, frequency, urgency, nocturia, hesitancy, discharge, hematuria, flank pain Breast: Breast lumps, nipple discharge, bleeding.  Musculoskeletal: Denies arthralgia, myalgia, stiffness, Jt. Swelling, pain, limp, and strain/sprain. Denies falls. Skin: Denies puritis, rash, hives, warts, acne, eczema, changing in skin lesion Neuro: No weakness, tremor, incoordination, spasms, paresthesia, pain Psychiatric: Denies confusion, memory loss, sensory loss. Denies Depression. Endocrine: Denies change in weight, skin, hair change, nocturia, and paresthesia, diabetic polys, visual blurring, hyper / hypo glycemic episodes.  Heme/Lymph: No excessive bleeding, bruising, enlarged lymph nodes.  Physical Exam  BP 120/72   Pulse 60   Temp (!) 97.2 F (36.2 C)   Resp 16   Ht 5\' 1"  (1.549 m)   Wt 112 lb (50.8 kg)   BMI 21.16 kg/m   General Appearance: Well nourished, well groomed and in no apparent distress.  Eyes: PERRLA, EOMs, conjunctiva no swelling or erythema, normal fundi and vessels. Sinuses: No frontal/maxillary tenderness ENT/Mouth: EACs patent / TMs  nl. Nares clear without erythema, swelling, mucoid exudates. Oral hygiene is good. No erythema, swelling, or exudate. Tongue normal, non-obstructing. Tonsils not swollen or erythematous.  Hearing normal.  Neck: Supple, thyroid normal. No bruits, nodes or JVD. Respiratory: Respiratory effort normal.  BS equal and clear bilateral without rales, rhonci, wheezing or stridor. Cardio: Heart sounds are normal with regular rate and rhythm and no murmurs, rubs or gallops. Peripheral pulses are normal and equal bilaterally without edema. No aortic or femoral bruits. Chest: symmetric with normal excursions and percussion. Breasts: Symmetric, without lumps, nipple discharge, retractions, or fibrocystic changes.  Abdomen: Flat, soft with bowel sounds active. Nontender, no guarding, rebound, hernias, masses, or organomegaly.  Lymphatics: Non tender without lymphadenopathy.  Musculoskeletal: Full ROM all peripheral extremities, joint stability, 5/5 strength, and normal gait. Skin: Warm and dry without rashes, lesions, cyanosis, clubbing or  ecchymosis.  Neuro: Cranial nerves intact, reflexes equal bilaterally. Normal muscle tone, no cerebellar symptoms. Sensation intact.  Pysch: Alert and oriented X 3, normal affect, Insight and Judgment appropriate.   Assessment and Plan  1. Annual Preventative Screening Examination  1. Essential hypertension  - EKG 12-Lead - Urinalysis, Routine w  reflex microscopic - Microalbumin / creatinine urine ratio - CBC with Differential/Platelet - BASIC METABOLIC PANEL WITH GFR - Magnesium - TSH - atenolol (TENORMIN) 25 MG tablet; Take 1 tablet daily for BP  Dispense: 90 tablet; Refill: 1  2. Hyperlipidemia, mixed  - EKG 12-Lead - Hepatic function panel - Lipid panel - TSH  3. Prediabetes  - EKG 12-Lead - Hemoglobin A1c - Insulin, random  4. Vitamin D deficiency  - VITAMIN D 25 Hydroxy (Vit-D Deficiency, Fractures)  5. Other abnormal glucose  - Hemoglobin A1c - Insulin, random  6. Primary osteoarthritis involving multiple joints   7. Screening for colorectal cancer   8. Screening for ischemic heart disease  - POC Hemoccult Bld/Stl  (3-Cd Home Screen); Future  9. Medication management  - Urinalysis, Routine w reflex microscopic - Microalbumin / creatinine urine ratio - CBC with Differential/Platelet - BASIC METABOLIC PANEL WITH GFR - Hepatic function panel - Magnesium - Lipid panel - TSH - Hemoglobin A1c - Insulin, random - VITAMIN D 25 Hydroxy (Vit-D Deficiency, Fractures)  10. Need for prophylactic vaccination against Streptococcus pneumoniae (pneumococcus)  - Pneumococcal conjugate vaccine 13-valent  11. Mixed hyperlipidemia - strongly encouraged patient to take Zetia.  - ezetimibe (ZETIA) 10 MG tablet; Take 1 tablet daily for Cholesterol  Dispense: 90 tablet; Refill: 1       Patient was counseled in prudent diet to achieve/maintain BMI less than 25 for weight control, BP monitoring, regular exercise and medications. Discussed med's effects and SE's. Screening labs and tests as requested with regular follow-up as recommended. Over 40 minutes of exam, counseling, chart review and high complex critical decision making was performed.

## 2016-12-02 LAB — BASIC METABOLIC PANEL WITH GFR
BUN: 11 mg/dL (ref 7–25)
CO2: 23 mmol/L (ref 20–31)
Calcium: 9.8 mg/dL (ref 8.6–10.4)
Chloride: 99 mmol/L (ref 98–110)
Creat: 0.8 mg/dL (ref 0.60–0.93)
GFR, EST AFRICAN AMERICAN: 85 mL/min (ref >=60)
GFR, Est Non African American: 74 mL/min (ref >=60)
GLUCOSE: 80 mg/dL (ref 65–99)
Potassium: 4.4 mmol/L (ref 3.5–5.3)
Sodium: 137 mmol/L (ref 135–146)

## 2016-12-02 LAB — URINALYSIS, ROUTINE W REFLEX MICROSCOPIC
BILIRUBIN URINE: NEGATIVE
Glucose, UA: NEGATIVE
Hgb urine dipstick: NEGATIVE
Ketones, ur: NEGATIVE
Nitrite: NEGATIVE
PROTEIN: NEGATIVE
Specific Gravity, Urine: 1.009 (ref 1.001–1.035)
pH: 7 (ref 5.0–8.0)

## 2016-12-02 LAB — URINALYSIS, MICROSCOPIC ONLY
BACTERIA UA: NONE SEEN [HPF]
Casts: NONE SEEN [LPF]
Crystals: NONE SEEN [HPF]
RBC / HPF: NONE SEEN RBC/HPF (ref <=2)
Squamous Epithelial / LPF: NONE SEEN [HPF] (ref <=5)
YEAST: NONE SEEN [HPF]

## 2016-12-02 LAB — LIPID PANEL
CHOL/HDL RATIO: 4.3 ratio (ref <5.0)
CHOLESTEROL: 298 mg/dL — AB (ref <200)
HDL: 69 mg/dL (ref >50)
LDL Cholesterol: 170 mg/dL — ABNORMAL HIGH (ref <100)
Triglycerides: 293 mg/dL — ABNORMAL HIGH (ref <150)
VLDL: 59 mg/dL — ABNORMAL HIGH (ref <30)

## 2016-12-02 LAB — HEPATIC FUNCTION PANEL
ALBUMIN: 4.3 g/dL (ref 3.6–5.1)
ALT: 8 U/L (ref 6–29)
AST: 17 U/L (ref 10–35)
Alkaline Phosphatase: 85 U/L (ref 33–130)
Bilirubin, Direct: 0.1 mg/dL (ref <=0.2)
Indirect Bilirubin: 0.3 mg/dL (ref 0.2–1.2)
TOTAL PROTEIN: 7.5 g/dL (ref 6.1–8.1)
Total Bilirubin: 0.4 mg/dL (ref 0.2–1.2)

## 2016-12-02 LAB — MICROALBUMIN / CREATININE URINE RATIO
CREATININE, URINE: 52 mg/dL (ref 20–320)
Microalb Creat Ratio: 21 mcg/mg creat (ref <30)
Microalb, Ur: 1.1 mg/dL

## 2016-12-02 LAB — INSULIN, RANDOM: INSULIN: 3.7 u[IU]/mL (ref 2.0–19.6)

## 2016-12-02 LAB — MAGNESIUM: MAGNESIUM: 2.1 mg/dL (ref 1.5–2.5)

## 2016-12-02 LAB — HEMOGLOBIN A1C
HEMOGLOBIN A1C: 5.5 % (ref <5.7)
MEAN PLASMA GLUCOSE: 111 mg/dL

## 2016-12-02 LAB — VITAMIN D 25 HYDROXY (VIT D DEFICIENCY, FRACTURES): VIT D 25 HYDROXY: 23 ng/mL — AB (ref 30–100)

## 2016-12-05 ENCOUNTER — Encounter: Payer: Self-pay | Admitting: Internal Medicine

## 2016-12-31 ENCOUNTER — Other Ambulatory Visit: Payer: Self-pay | Admitting: Internal Medicine

## 2016-12-31 NOTE — Telephone Encounter (Signed)
Please call Alpraz  

## 2016-12-31 NOTE — Telephone Encounter (Signed)
RX called to [facility].

## 2017-02-17 DIAGNOSIS — H524 Presbyopia: Secondary | ICD-10-CM | POA: Diagnosis not present

## 2017-02-24 ENCOUNTER — Encounter: Payer: Self-pay | Admitting: Physician Assistant

## 2017-03-08 DIAGNOSIS — H25813 Combined forms of age-related cataract, bilateral: Secondary | ICD-10-CM | POA: Diagnosis not present

## 2017-03-17 HISTORY — PX: CATARACT EXTRACTION: SUR2

## 2017-03-21 DIAGNOSIS — H2513 Age-related nuclear cataract, bilateral: Secondary | ICD-10-CM | POA: Diagnosis not present

## 2017-03-21 DIAGNOSIS — H25811 Combined forms of age-related cataract, right eye: Secondary | ICD-10-CM | POA: Diagnosis not present

## 2017-03-22 NOTE — Progress Notes (Signed)
MEDICARE ANNUAL WELLNESS VISIT AND FOLLOW UP  Assessment:   Essential hypertension - continue medications, DASH diet, exercise and monitor at home. Call if greater than 130/80.  -     CBC with Differential/Platelet -     BASIC METABOLIC PANEL WITH GFR -     Hepatic function panel -     TSH -     DG Chest 2 View; Future  Hyperlipidemia, unspecified hyperlipidemia type -continue medications, check lipids, decrease fatty foods, increase activity.  DECLINES STATIN DUE TO MUSCLE ACHES, UNDERSTANDS RISK OF MI/STROKE -     Lipid panel  Prediabetes Discussed disease progression and risks Discussed diet/exercise, weight management and risk modification A1C -     Hemoglobin A1c  Medication management -     Magnesium  Vitamin D deficiency -     VITAMIN D 25 Hydroxy (Vit-D Deficiency, Fractures)  Primary osteoarthritis involving multiple joints MONITOR  Body mass index (BMI) of 22.90 in adult Add ensure/boost  Poor compliance with medication Continue meds  Encounter for Medicare annual wellness exam 1 year follow up GET MGM Colonoscopy- patient declines a colonoscopy even though the risks and benefits were discussed at length. Colon cancer is 3rd most diagnosed cancer and 2nd leading cause of death in both men and women 29 years of age and older. Patient understands the risk of cancer and death with declining the test however they are willing to do cologuard screening instead. They understand that this is not as sensitive or specific as a colonoscopy and they are still recommended to get a colonoscopy. The cologuard will be sent out to their house.   Tobacco use Smoking cessation-  instruction/counseling given, counseled patient on the dangers of tobacco use, advised patient to stop smoking, and reviewed strategies to maximize success, patient not ready to quit at this time.  -     DG Chest 2 View; Future  Smokers' cough ([place]) Get CXR  LAD (lymphadenopathy) of right cervical  region -     US Soft Tissue Head/Neck; Future With smoking history get US neck to rule out pathological lymphnode   Over 40 minutes of exam, counseling, chart review and critical decision making was performed Future Appointments  Date Time Provider [place]  06/23/2017  2:30 PM Vicie Mutters, PA-C [facility] None  12/22/2017  2:00 PM [name], MD [facility] None     Plan:   During the course of the visit the patient was educated and counseled about appropriate screening and preventive services including:    Pneumococcal vaccine   Prevnar 13  Influenza vaccine  Td vaccine  Screening electrocardiogram  Bone densitometry screening  Colorectal cancer screening  Diabetes screening  Glaucoma screening  Nutrition counseling   Advanced directives: requested   Subjective:  Stacy [name] is a 71 y.o. female who presents for Medicare Annual Wellness Visit and complete physical.    She has had elevated blood pressure for several years controlled with diet and exercise. Her blood pressure has been controlled at home, today their BP is BP: 118/64 [place]. She denies chest pain, shortness of breath, dizziness.   She works out in the yard and also does some walking everyday.   She is on cholesterol medication  Her cholesterol is not at goal. The cholesterol last visit was:   Lab Results  Component Value Date   CHOL 298 (H) 12/01/2016   HDL 69 12/01/2016   LDLCALC 170 (H) 12/01/2016   TRIG 293 (  H) 12/01/2016   CHOLHDL 4.3 12/01/2016  She is on zetia, unable to tolerate statins.   She does watch her diet as she has asymptomatic prediabetes.   Lab Results  Component Value Date   HGBA1C 5.5 12/01/2016   She will take xanax PRN for sleep.   Last GFR: Lab Results  Component Value Date   GFRNONAA 74 12/01/2016   Patient is on Vitamin D supplement, on 10,000 a day since last visit.   Lab Results  Component Value Date   VD25OH 23  (L) 12/01/2016     BMI is Body mass index is 21.54 kg/m., she is working on diet and exercise. Wt Readings from Last 3 Encounters:  03/23/17 114 lb (51.7 kg)  12/01/16 112 lb (50.8 kg)  11/11/15 116 lb (52.6 kg)     Medication Review: Current Outpatient Medications on File Prior to Visit  Medication Sig Dispense Refill  . ALPRAZolam (XANAX) 0.5 MG tablet TAKE ONE-HALF TO ONE TABLET BY MOUTH TWICE DAILY FOR ANXIETY/SLEEP 60 tablet 2  . aspirin 81 MG tablet Take 81 mg by mouth daily.    Marland Kitchen atenolol (TENORMIN) 25 MG tablet Take 1 tablet daily for BP 90 tablet 1  . Calcium Carbonate-Vitamin D (CALCIUM-VITAMIN D) 500-200 MG-UNIT per tablet Take 1 tablet by mouth 2 (two) times daily.    Marland Kitchen ezetimibe (ZETIA) 10 MG tablet Take 1 tablet daily for Cholesterol 90 tablet 1  . vitamin C (ASCORBIC ACID) 500 MG tablet Take 500 mg by mouth daily.    . Vitamin E 200 UNITS TABS Take by mouth.     No current facility-administered medications on file prior to visit.     Allergies  Allergen Reactions  . Dilaudid [Hydromorphone Hcl] Swelling  . Crestor [Rosuvastatin]   . Fentanyl   . Penicillins     Current Problems (verified) Patient Active Problem List   Diagnosis Date Noted  . Tobacco use 03/23/2017  . Body mass index (BMI) of 22.90 in adult 02/17/2015  . Poor compliance with medication 02/17/2015  . Medication management 06/10/2014  . Hypertension   . Hyperlipidemia   . Vitamin D deficiency   . Prediabetes   . DJD (degenerative joint disease)     Screening Tests Immunization History  Administered Date(s) Administered  . Influenza-Unspecified 01/31/2014, 01/02/2015  . Pneumococcal Conjugate-13 12/01/2016  . Pneumococcal Polysaccharide-23 08/21/2012  . Td 05/04/2003  . Tdap 11/08/2013   Preventative care: Last colonoscopy: 2011, cologuard ordered today Last mammogram: 2015 Number given to call Last pap smear/pelvic exam:  DEXA: 2015 CXR 2012  Prior vaccinations: TD or Tdap:  2015  Influenza: 2016 DUE Pneumococcal: 2014 Prevnar13:  2018 Shingles/Zostavax: N/A  Names of Other Physician/Practitioners you currently use: 1. [facility] here for primary care 2. Dr. Ronnald Ramp, eye doctor, last visit 2018 3. Dr. Lynnette Caffey, dentist, last visit 2018 Patient Care Team: [name], MD as PCP - General (Internal Medicine) Etta Quill, [place] as Referring Physician (Optometry) Luretha Rued, Carney Living, DDS (Dentistry) Sypher, Herbie [place], MD (Inactive) as Consulting Physician (Orthopedic Surgery) Marylynn Pearson, MD as Consulting Physician (Obstetrics and Gynecology) Juanita Craver, MD as Consulting Physician (Gastroenterology)  SURGICAL HISTORY She  has a past surgical history that includes Neck surgery (2012 ); Laminectomy (2002); Hand surgery; Trigger finger release; head injury (1995); and Cataract extraction (Right, 03/17/2017). FAMILY HISTORY Her family history includes Cancer - Other in her father and mother; Diabetes in her mother; Heart attack (age of onset: 70) in her  father; Heart attack (age of onset: 93) in her mother; Heart disease in her mother; Hyperlipidemia in her mother; Hypertension in her father. SOCIAL HISTORY She  reports that she has been smoking.  she has never used smokeless tobacco. She reports that she does not drink alcohol.   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 N N  Vision?  N N  Difficulty concentrating or making decisions? N N  Walking or climbing stairs? N N  Dressing or bathing? N N  Doing errands, shopping? N N  Some recent data might be hidden     Cognitive Testing  Alert? Yes  Normal Appearance?Yes  Oriented to person? Yes  Place? Yes   Time? Yes  Recall of three objects?  Yes  Can perform simple calculations? Yes  Displays appropriate judgment?Yes  Can read the correct time from a watch face?Yes  EOL planning: Does Patient Have a Medical Advance Directive?: Yes Type of Advance Directive: Healthcare Power of Attorney, Living will Does patient want to make changes to medical advance directive?: No - Patient declined  Review of Systems  Constitutional: Negative for chills, fever and malaise/fatigue.  HENT: Negative for congestion, ear pain and sore throat.   Eyes: Negative.   Respiratory: Positive for cough. Negative for shortness of breath and wheezing.   Cardiovascular: Negative for chest pain, palpitations and leg swelling.  Gastrointestinal: Negative for abdominal pain, blood in stool, constipation, diarrhea, heartburn and melena.  Genitourinary: Negative.   Skin: Negative.   Neurological: Negative for dizziness, sensory change, loss of consciousness and headaches.  Psychiatric/Behavioral: Negative for depression. The patient is not nervous/anxious and does not have insomnia.      Objective:     Today's Vitals   03/23/17 1553  BP: 118/64  Pulse: 67  Temp: (!) 97.1 F (36.2 C)  SpO2: 96%  Weight: 114 lb (51.7 kg)  Height: 5\' 1"  (1.549 m)  PainSc: 0-No pain   Body mass index is 21.54 kg/m.  General appearance: alert, skinny, no distress, WD/WN, female HEENT: normocephalic, sclerae anicteric, TMs pearly, nares patent, no discharge or erythema, pharynx normal Oral cavity: MMM, no lesions Neck: supple, + right cervical/submandicular lymphadenopathy nontender, hard, mobile, no thyromegaly, no masses Heart: RRR, normal S1, S2, no  murmurs Lungs: CTA bilaterally, + wheezes bilateral lower lobes without rhonchi, or rales Abdomen: +bs, soft, non tender, non distended, no masses, no hepatomegaly, no splenomegaly Musculoskeletal: nontender, no swelling, no obvious deformity Extremities: no edema, no cyanosis, no clubbing Pulses: 2+ symmetric, upper and lower extremities, normal cap refill Neurological: alert, oriented x 3, CN2-12 intact, strength normal upper extremities and lower extremities, sensation normal throughout, DTRs 2+ throughout, no cerebellar signs, gait normal Psychiatric: normal affect, behavior normal, pleasant   Medicare Attestation I have personally reviewed: The patient's medical and social history Their use of alcohol, tobacco or illicit drugs Their current medications and supplements The patient's functional ability including ADLs,fall risks, home safety risks, cognitive, and hearing and visual impairment Diet and physical activities Evidence for depression or mood disorders  The  patient's weight, height, BMI, and visual acuity have been recorded in the chart.  I have made referrals, counseling, and provided education to the patient based on review of the above and I have provided the patient with a written personalized care plan for preventive services.     Vicie Mutters, PA-C   03/23/2017

## 2017-03-23 ENCOUNTER — Ambulatory Visit: Payer: PPO | Admitting: Physician Assistant

## 2017-03-23 ENCOUNTER — Encounter: Payer: Self-pay | Admitting: Physician Assistant

## 2017-03-23 VITALS — BP 118/64 | HR 67 | Temp 97.1°F | Ht 61.0 in | Wt 114.0 lb

## 2017-03-23 DIAGNOSIS — Z79899 Other long term (current) drug therapy: Secondary | ICD-10-CM | POA: Diagnosis not present

## 2017-03-23 DIAGNOSIS — M159 Polyosteoarthritis, unspecified: Secondary | ICD-10-CM

## 2017-03-23 DIAGNOSIS — Z9114 Patient's other noncompliance with medication regimen: Secondary | ICD-10-CM | POA: Diagnosis not present

## 2017-03-23 DIAGNOSIS — R590 Localized enlarged lymph nodes: Secondary | ICD-10-CM | POA: Diagnosis not present

## 2017-03-23 DIAGNOSIS — R7303 Prediabetes: Secondary | ICD-10-CM | POA: Diagnosis not present

## 2017-03-23 DIAGNOSIS — J410 Simple chronic bronchitis: Secondary | ICD-10-CM

## 2017-03-23 DIAGNOSIS — Z91148 Patient's other noncompliance with medication regimen for other reason: Secondary | ICD-10-CM

## 2017-03-23 DIAGNOSIS — E785 Hyperlipidemia, unspecified: Secondary | ICD-10-CM | POA: Diagnosis not present

## 2017-03-23 DIAGNOSIS — E559 Vitamin D deficiency, unspecified: Secondary | ICD-10-CM | POA: Diagnosis not present

## 2017-03-23 DIAGNOSIS — M150 Primary generalized (osteo)arthritis: Secondary | ICD-10-CM

## 2017-03-23 DIAGNOSIS — R6889 Other general symptoms and signs: Secondary | ICD-10-CM | POA: Diagnosis not present

## 2017-03-23 DIAGNOSIS — I10 Essential (primary) hypertension: Secondary | ICD-10-CM

## 2017-03-23 DIAGNOSIS — Z0001 Encounter for general adult medical examination with abnormal findings: Secondary | ICD-10-CM | POA: Diagnosis not present

## 2017-03-23 DIAGNOSIS — Z6822 Body mass index (BMI) 22.0-22.9, adult: Secondary | ICD-10-CM

## 2017-03-23 DIAGNOSIS — Z0000 Encounter for general adult medical examination without abnormal findings: Secondary | ICD-10-CM

## 2017-03-23 DIAGNOSIS — Z720 Tobacco use: Secondary | ICD-10-CM | POA: Diagnosis not present

## 2017-03-23 NOTE — Patient Instructions (Addendum)
Go to [facility] hospital and get chest xray between 830-400 Any day between Monday and [place]  7 a.m.-6:30 p.m., Monday 7 a.m.-5 p.m., Tuesday-Friday Schedule an appointment by calling [phone].  Encourage you to get the 3D Mammogram  The 3D Mammogram is much more specific and sensitive to pick up breast cancer. For women with fibrocystic breast or lumpy breast it can be hard to determine if it is cancer or not but the 3D mammogram is able to tell this difference which cuts back on unneeded additional tests or scary call backs.   - over 40% increase in detection of breast cancer - over 40% reduction in false positives.  - fewer call backs - reduced anxiety - improved outcomes - [facility]  Cologuard is an easy to use noninvasive colon cancer screening test based on the latest advances in stool DNA science.   Colon cancer is 3rd most diagnosed cancer and 2nd leading cause of death in both men and women 25 years of age and older despite being one of the most preventable and treatable cancers if found early.  4 of out 5 people diagnosed with colon cancer have NO prior family history.  When caught EARLY 90% of colon cancer is curable.   You have agreed to do a Cologuard screening and have declined a colonoscopy in spite of being explained the risks and benefits of the colonoscopy in detail, including cancer and death.   You will receive a short call from [place] support center at [employer], when you receive a call they will say they are from [place],  to confirm your mailing address and give you more information.  When they calll you, it will appear on the caller ID as "[employer]" or in some cases only this number will appear, [phone].   [employer] will ship your collection kit directly to you. You will collect a single stool sample in the privacy of your own home, no special  preparation required. You will return the kit via [facility] pre-paid shipping or pick-up, in the same box it arrived in. Then I will contact you to discuss your results after I receive them from the laboratory.   If you have any questions or concerns, Cologuard Customer Support Specialist are available 24 hours a day, 7 days a week at [phone] or go to [email or web address].

## 2017-03-24 LAB — HEMOGLOBIN A1C
EAG (MMOL/L): 6.3 (calc)
HEMOGLOBIN A1C: 5.6 %{Hb} (ref <5.7)
MEAN PLASMA GLUCOSE: 114 (calc)

## 2017-03-24 LAB — CBC WITH DIFFERENTIAL/PLATELET
Basophils Absolute: 46 cells/uL (ref 0–200)
Basophils Relative: 0.5 %
Eosinophils Absolute: 239 cells/uL (ref 15–500)
Eosinophils Relative: 2.6 %
HCT: 44 % (ref 35.0–45.0)
Hemoglobin: 14.8 g/dL (ref 11.7–15.5)
Lymphs Abs: 3110 cells/uL (ref 850–3900)
MCH: 32.8 pg (ref 27.0–33.0)
MCHC: 33.6 g/dL (ref 32.0–36.0)
MCV: 97.6 fL (ref 80.0–100.0)
MONOS PCT: 5.3 %
MPV: 10.1 fL (ref 7.5–12.5)
NEUTROS PCT: 57.8 %
Neutro Abs: 5318 cells/uL (ref 1500–7800)
PLATELETS: 279 10*3/uL (ref 140–400)
RBC: 4.51 10*6/uL (ref 3.80–5.10)
RDW: 12.5 % (ref 11.0–15.0)
TOTAL LYMPHOCYTE: 33.8 %
WBC: 9.2 10*3/uL (ref 3.8–10.8)
WBCMIX: 488 {cells}/uL (ref 200–950)

## 2017-03-24 LAB — BASIC METABOLIC PANEL WITH GFR
BUN: 9 mg/dL (ref 7–25)
CHLORIDE: 101 mmol/L (ref 98–110)
CO2: 29 mmol/L (ref 20–32)
Calcium: 10 mg/dL (ref 8.6–10.4)
Creat: 0.78 mg/dL (ref 0.60–0.93)
GFR, EST AFRICAN AMERICAN: 88 mL/min/{1.73_m2} (ref >=60)
GFR, Est Non African American: 76 mL/min/{1.73_m2} (ref >=60)
Glucose, Bld: 80 mg/dL (ref 65–99)
POTASSIUM: 4.4 mmol/L (ref 3.5–5.3)
SODIUM: 138 mmol/L (ref 135–146)

## 2017-03-24 LAB — HEPATIC FUNCTION PANEL
AG Ratio: 1.3 (calc) (ref 1.0–2.5)
ALKALINE PHOSPHATASE (APISO): 75 U/L (ref 33–130)
ALT: 7 U/L (ref 6–29)
AST: 16 U/L (ref 10–35)
Albumin: 4.4 g/dL (ref 3.6–5.1)
BILIRUBIN DIRECT: 0.1 mg/dL (ref 0.0–0.2)
BILIRUBIN INDIRECT: 0.2 mg/dL (ref 0.2–1.2)
Globulin: 3.5 g/dL (calc) (ref 1.9–3.7)
Total Bilirubin: 0.3 mg/dL (ref 0.2–1.2)
Total Protein: 7.9 g/dL (ref 6.1–8.1)

## 2017-03-24 LAB — LIPID PANEL
Cholesterol: 270 mg/dL — ABNORMAL HIGH (ref <200)
HDL: 70 mg/dL (ref >50)
LDL CHOLESTEROL (CALC): 156 mg/dL — AB
Non-HDL Cholesterol (Calc): 200 mg/dL (calc) — ABNORMAL HIGH (ref <130)
Total CHOL/HDL Ratio: 3.9 (calc) (ref <5.0)
Triglycerides: 271 mg/dL — ABNORMAL HIGH (ref <150)

## 2017-03-24 LAB — MAGNESIUM: MAGNESIUM: 2.2 mg/dL (ref 1.5–2.5)

## 2017-03-24 LAB — TSH: TSH: 2.05 m[IU]/L (ref 0.40–4.50)

## 2017-03-24 LAB — VITAMIN D 25 HYDROXY (VIT D DEFICIENCY, FRACTURES): VIT D 25 HYDROXY: 48 ng/mL (ref 30–100)

## 2017-03-28 NOTE — Progress Notes (Signed)
Pt aware of lab results & voiced understanding of those results.

## 2017-03-29 DIAGNOSIS — H2513 Age-related nuclear cataract, bilateral: Secondary | ICD-10-CM | POA: Diagnosis not present

## 2017-04-01 ENCOUNTER — Ambulatory Visit
Admission: RE | Admit: 2017-04-01 | Discharge: 2017-04-01 | Disposition: A | Discharge status: Home / Self Care | Payer: PPO | Source: Ambulatory Visit | Attending: Physician Assistant | Admitting: Physician Assistant

## 2017-04-01 DIAGNOSIS — Z720 Tobacco use: Secondary | ICD-10-CM

## 2017-04-01 DIAGNOSIS — R590 Localized enlarged lymph nodes: Secondary | ICD-10-CM | POA: Diagnosis not present

## 2017-04-01 DIAGNOSIS — I10 Essential (primary) hypertension: Secondary | ICD-10-CM

## 2017-04-01 DIAGNOSIS — R05 Cough: Secondary | ICD-10-CM | POA: Diagnosis not present

## 2017-04-02 ENCOUNTER — Encounter: Payer: Self-pay | Admitting: Physician Assistant

## 2017-04-02 DIAGNOSIS — J849 Interstitial pulmonary disease, unspecified: Secondary | ICD-10-CM | POA: Insufficient documentation

## 2017-04-02 DIAGNOSIS — J449 Chronic obstructive pulmonary disease, unspecified: Secondary | ICD-10-CM | POA: Insufficient documentation

## 2017-04-04 NOTE — Progress Notes (Signed)
Pt aware of lab results & voiced understanding of those results.

## 2017-04-17 ENCOUNTER — Encounter (HOSPITAL_COMMUNITY): Payer: Self-pay | Admitting: Emergency Medicine

## 2017-04-17 ENCOUNTER — Emergency Department (HOSPITAL_COMMUNITY)
Admission: EM | Admit: 2017-04-17 | Discharge: 2017-04-18 | Disposition: A | Discharge status: Home / Self Care | Payer: PPO | Attending: Emergency Medicine | Admitting: Emergency Medicine

## 2017-04-17 DIAGNOSIS — Z7982 Long term (current) use of aspirin: Secondary | ICD-10-CM | POA: Insufficient documentation

## 2017-04-17 DIAGNOSIS — F17210 Nicotine dependence, cigarettes, uncomplicated: Secondary | ICD-10-CM | POA: Insufficient documentation

## 2017-04-17 DIAGNOSIS — Z79899 Other long term (current) drug therapy: Secondary | ICD-10-CM | POA: Insufficient documentation

## 2017-04-17 DIAGNOSIS — K5792 Diverticulitis of intestine, part unspecified, without perforation or abscess without bleeding: Secondary | ICD-10-CM | POA: Insufficient documentation

## 2017-04-17 DIAGNOSIS — I10 Essential (primary) hypertension: Secondary | ICD-10-CM | POA: Insufficient documentation

## 2017-04-17 DIAGNOSIS — K5732 Diverticulitis of large intestine without perforation or abscess without bleeding: Secondary | ICD-10-CM | POA: Diagnosis not present

## 2017-04-17 DIAGNOSIS — Z880 Allergy status to penicillin: Secondary | ICD-10-CM | POA: Diagnosis not present

## 2017-04-17 DIAGNOSIS — R1032 Left lower quadrant pain: Secondary | ICD-10-CM | POA: Diagnosis present

## 2017-04-17 NOTE — ED Triage Notes (Signed)
Pt from home with c/o lower left abdominal pain that began on Thursday and she has been taking left over doxycycline from another illness and AZO. Pt has been taking other medications for pain that she has leftover. Pt denies vomiting and emesis. Pt states she does have nausea. Pt denies pain with urination.

## 2017-04-18 ENCOUNTER — Other Ambulatory Visit: Payer: Self-pay

## 2017-04-18 ENCOUNTER — Encounter (HOSPITAL_COMMUNITY): Payer: Self-pay

## 2017-04-18 ENCOUNTER — Emergency Department (HOSPITAL_COMMUNITY): Payer: PPO

## 2017-04-18 DIAGNOSIS — R1032 Left lower quadrant pain: Secondary | ICD-10-CM | POA: Diagnosis not present

## 2017-04-18 LAB — COMPREHENSIVE METABOLIC PANEL
ALT: 8 U/L — AB (ref 14–54)
ANION GAP: 10 (ref 5–15)
AST: 16 U/L (ref 15–41)
Albumin: 3.8 g/dL (ref 3.5–5.0)
Alkaline Phosphatase: 75 U/L (ref 38–126)
BUN: 16 mg/dL (ref 6–20)
CHLORIDE: 104 mmol/L (ref 101–111)
CO2: 22 mmol/L (ref 22–32)
CREATININE: 1.13 mg/dL — AB (ref 0.44–1.00)
Calcium: 9.5 mg/dL (ref 8.9–10.3)
GFR, EST AFRICAN AMERICAN: 55 mL/min — AB (ref >60)
GFR, EST NON AFRICAN AMERICAN: 47 mL/min — AB (ref >60)
Glucose, Bld: 108 mg/dL — ABNORMAL HIGH (ref 65–99)
POTASSIUM: 4.1 mmol/L (ref 3.5–5.1)
Sodium: 136 mmol/L (ref 135–145)
Total Bilirubin: 0.5 mg/dL (ref 0.3–1.2)
Total Protein: 7.9 g/dL (ref 6.5–8.1)

## 2017-04-18 LAB — CBC
HCT: 39 % (ref 36.0–46.0)
Hemoglobin: 13.2 g/dL (ref 12.0–15.0)
MCH: 33.7 pg (ref 26.0–34.0)
MCHC: 33.8 g/dL (ref 30.0–36.0)
MCV: 99.5 fL (ref 78.0–100.0)
PLATELETS: 229 10*3/uL (ref 150–400)
RBC: 3.92 MIL/uL (ref 3.87–5.11)
RDW: 13 % (ref 11.5–15.5)
WBC: 14.3 10*3/uL — AB (ref 4.0–10.5)

## 2017-04-18 LAB — LIPASE, BLOOD: LIPASE: 38 U/L (ref 11–51)

## 2017-04-18 MED ORDER — HYDROCODONE-ACETAMINOPHEN 5-325 MG PO TABS: 1.0000 | ORAL_TABLET | Freq: Four times a day (QID) | ORAL | 0 refills | Status: DC | PRN

## 2017-04-18 MED ORDER — IOPAMIDOL (ISOVUE-300) INJECTION 61%
30.0000 mL | Freq: Once | INTRAVENOUS | Status: AC | PRN
  Administered 2017-04-18: 30 mL via ORAL

## 2017-04-18 MED ORDER — IOPAMIDOL (ISOVUE-300) INJECTION 61%
INTRAVENOUS | Status: AC
  Administered 2017-04-18: 80 mL via INTRAVENOUS
  Filled 2017-04-18: qty 100

## 2017-04-18 MED ORDER — IOPAMIDOL (ISOVUE-300) INJECTION 61%
INTRAVENOUS | Status: AC
  Administered 2017-04-18: 30 mL via ORAL
  Filled 2017-04-18: qty 30

## 2017-04-18 MED ORDER — MORPHINE SULFATE (PF) 4 MG/ML IV SOLN
4.0000 mg | Freq: Once | INTRAVENOUS | Status: AC
  Administered 2017-04-18: 4 mg via INTRAVENOUS
  Filled 2017-04-18: qty 1

## 2017-04-18 MED ORDER — SODIUM CHLORIDE 0.9 % IV BOLUS (SEPSIS)
1000.0000 mL | Freq: Once | INTRAVENOUS | Status: AC
  Administered 2017-04-18: 1000 mL via INTRAVENOUS

## 2017-04-18 MED ORDER — METRONIDAZOLE 500 MG PO TABS: 500.0000 mg | ORAL_TABLET | Freq: Three times a day (TID) | ORAL | 0 refills | Status: DC

## 2017-04-18 MED ORDER — ONDANSETRON HCL 4 MG/2ML IJ SOLN
4.0000 mg | Freq: Once | INTRAMUSCULAR | Status: AC
  Administered 2017-04-18: 4 mg via INTRAVENOUS
  Filled 2017-04-18: qty 2

## 2017-04-18 MED ORDER — IOPAMIDOL (ISOVUE-300) INJECTION 61%
100.0000 mL | Freq: Once | INTRAVENOUS | Status: AC | PRN
  Administered 2017-04-18: 80 mL via INTRAVENOUS

## 2017-04-18 MED ORDER — CIPROFLOXACIN HCL 500 MG PO TABS: 500.0000 mg | ORAL_TABLET | Freq: Two times a day (BID) | ORAL | 0 refills | Status: DC

## 2017-04-18 NOTE — Discharge Instructions (Signed)
Cipro and Flagyl as prescribed.  Hydrocodone is prescribed as needed for pain.  Avoid a diet high in nuts or seeds and increase your fiber intake.  Return to the emergency department if you develop worsening pain, high fevers, bloody stools, or other new and concerning symptoms.

## 2017-04-18 NOTE — ED Provider Notes (Signed)
[facility] DEPT Provider Note   CSN: [id number] Arrival date & time: 04/17/17  2250     History   Chief Complaint Chief Complaint  Patient presents with  . Abdominal Pain    HPI Stacy [name] is a 71 y.o. female.  Patient is a 71 year old female with history of COPD, hypertension presenting for evaluation of left lower quadrant pain.  This is been ongoing for the past 4 days.  She denies any nausea, vomiting, or diarrhea.  She denies any bloody stools.  She denies any fevers or chills.  Her pain is been constant and has been gradually worsening.  She thought maybe she had a UTI and took doxycycline and Azo with little relief.  She does report having had a colonoscopy in the past approximately 5 years ago which she tells me was unremarkable with the exception for small polyp removal.   The history is provided by the patient.  Abdominal Pain   This is a new problem. Episode onset: 4 days ago. The problem occurs constantly. The problem has been gradually worsening. The pain is located in the LLQ. The quality of the pain is cramping. The pain is moderate. Pertinent negatives include fever, diarrhea, nausea, vomiting, dysuria and frequency. Nothing aggravates the symptoms. Nothing relieves the symptoms.    Past Medical History:  Diagnosis Date  . Carotid bruit    Bilateral  . Colon polyps    with divertirticulitis  . DJD (degenerative joint disease)   . Hyperlipidemia   . Hypertension   . Osteopenia   . Prediabetes   . Vitamin D deficiency     Patient Active Problem List   Diagnosis Date Noted  . COPD (chronic obstructive pulmonary disease) ([place]) 04/02/2017  . Interstitial lung disease ([place]) 04/02/2017  . Tobacco use 03/23/2017  . Body mass index (BMI) of 22.90 in adult 02/17/2015  . Poor compliance with medication 02/17/2015  . Medication management 06/10/2014  . Hypertension   . Hyperlipidemia   . Vitamin D deficiency   .  Prediabetes   . DJD (degenerative joint disease)     Past Surgical History:  Procedure Laterality Date  . CATARACT EXTRACTION Right 03/17/2017  . HAND SURGERY    . head injury  1995  . LAMINECTOMY  2002   L 5   . NECK SURGERY  2012    c4 -5/6  fushion  . TRIGGER FINGER RELEASE      OB History    No data available       Home Medications    Prior to Admission medications   Medication Sig Start Date End Date Taking? Authorizing Provider  ALPRAZolam Duanne Moron) 0.5 MG tablet TAKE ONE-HALF TO ONE TABLET BY MOUTH TWICE DAILY FOR ANXIETY/SLEEP 12/31/16   [name], MD  aspirin 81 MG tablet Take 81 mg by mouth daily.    [provider]  atenolol (TENORMIN) 25 MG tablet Take 1 tablet daily for BP 12/01/16 12/01/17  [name], MD  Calcium Carbonate-Vitamin D (CALCIUM-VITAMIN D) 500-200 MG-UNIT per tablet Take 1 tablet by mouth 2 (two) times daily.    [provider]  ezetimibe (ZETIA) 10 MG tablet Take 1 tablet daily for Cholesterol 12/01/16 06/03/17  [name], MD  vitamin C (ASCORBIC ACID) 500 MG tablet Take 500 mg by mouth daily.    [provider]  Vitamin E 200 UNITS TABS Take by mouth.    [provider]    Family History Family History  Problem Relation Age of Onset  . Hyperlipidemia Mother   . Heart disease Mother        7 way bypass/ value replacement/pacemaker  . Heart attack Mother 71  . Diabetes Mother   . Cancer - Other Mother        lung  . Hypertension Father   . Heart attack Father 2  . Cancer - Other Father        throat    Social History Social History   Tobacco Use  . Smoking status: Current Every Day Smoker  . Smokeless tobacco: Never Used  . Tobacco comment: smokes 2 cigarettes per day  Substance Use Topics  . Alcohol use: No  . Drug use: Not on file     Allergies   Dilaudid [hydromorphone hcl]; Crestor [rosuvastatin]; Fentanyl; and Penicillins   Review of Systems Review of Systems    Constitutional: Negative for fever.  Gastrointestinal: Positive for abdominal pain. Negative for diarrhea, nausea and vomiting.  Genitourinary: Negative for dysuria and frequency.  All other systems reviewed and are negative.    Physical Exam Updated Vital Signs BP 120/77 (BP Location: Left Arm)   Pulse 70   Temp 99.2 F (37.3 C) (Oral)   Resp 18   SpO2 95%   Physical Exam  Constitutional: She is oriented to person, place, and time. She appears well-developed and well-nourished. No distress.  HENT:  Head: Normocephalic and atraumatic.  Neck: Normal range of motion. Neck supple.  Cardiovascular: Normal rate and regular rhythm. Exam reveals no gallop and no friction rub.  No murmur heard. Pulmonary/Chest: Effort normal and breath sounds normal. No respiratory distress. She has no wheezes.  Abdominal: Soft. Bowel sounds are normal. She exhibits no distension. There is tenderness in the left lower quadrant. There is no rigidity, no rebound and no guarding.  There is tenderness to palpation in the left lower quadrant.  Musculoskeletal: Normal range of motion.  Neurological: She is alert and oriented to person, place, and time.  Skin: Skin is warm and dry. She is not diaphoretic.  Nursing note and vitals reviewed.    ED Treatments / Results  Labs (all labs ordered are listed, but only abnormal results are displayed) Labs Reviewed  LIPASE, BLOOD  COMPREHENSIVE METABOLIC PANEL  CBC  URINALYSIS, ROUTINE W REFLEX MICROSCOPIC    EKG  EKG Interpretation None       Radiology No results found.  Procedures Procedures (including critical care time)  Medications Ordered in ED Medications  ondansetron (ZOFRAN) injection 4 mg (not administered)  sodium chloride 0.9 % bolus 1,000 mL (not administered)  morphine 4 MG/ML injection 4 mg (not administered)     Initial Impression / Assessment and Plan / ED Course  I have reviewed the triage vital signs and the nursing  notes.  Pertinent labs & imaging results that were available during my care of the patient were reviewed by me and considered in my medical decision making (see chart for details).  Patient presenting with left lower quadrant pain.  She has an elevated white count and CT confirmed diverticulitis without abscess or perforation.  She is feeling better after fluids and medications.  I feel as though she is appropriate for an outpatient trial of antibiotics.  She will be given Cipro and Flagyl, pain medication, and is to follow-up with her primary doctor.  To return here if she worsens.  Final Clinical Impressions(s) / ED Diagnoses   Final diagnoses:  None    ED  Discharge Orders    None       Veryl Speak, MD 04/18/17 [phone]

## 2017-05-16 DIAGNOSIS — H2513 Age-related nuclear cataract, bilateral: Secondary | ICD-10-CM | POA: Diagnosis not present

## 2017-05-16 DIAGNOSIS — H2512 Age-related nuclear cataract, left eye: Secondary | ICD-10-CM | POA: Diagnosis not present

## 2017-05-16 DIAGNOSIS — H25812 Combined forms of age-related cataract, left eye: Secondary | ICD-10-CM | POA: Diagnosis not present

## 2017-05-24 DIAGNOSIS — H2513 Age-related nuclear cataract, bilateral: Secondary | ICD-10-CM | POA: Diagnosis not present

## 2017-06-23 ENCOUNTER — Encounter: Payer: Self-pay | Admitting: Physician Assistant

## 2017-06-23 ENCOUNTER — Ambulatory Visit (INDEPENDENT_AMBULATORY_CARE_PROVIDER_SITE_OTHER): Payer: PPO | Admitting: Physician Assistant

## 2017-06-23 VITALS — BP 118/64 | HR 62 | Temp 97.8°F | Resp 16 | Ht 61.0 in | Wt 118.6 lb

## 2017-06-23 DIAGNOSIS — J849 Interstitial pulmonary disease, unspecified: Secondary | ICD-10-CM | POA: Diagnosis not present

## 2017-06-23 DIAGNOSIS — E559 Vitamin D deficiency, unspecified: Secondary | ICD-10-CM | POA: Diagnosis not present

## 2017-06-23 DIAGNOSIS — M19049 Primary osteoarthritis, unspecified hand: Secondary | ICD-10-CM

## 2017-06-23 DIAGNOSIS — I700 Atherosclerosis of aorta: Secondary | ICD-10-CM

## 2017-06-23 DIAGNOSIS — Z7189 Other specified counseling: Secondary | ICD-10-CM | POA: Diagnosis not present

## 2017-06-23 DIAGNOSIS — Z79899 Other long term (current) drug therapy: Secondary | ICD-10-CM | POA: Diagnosis not present

## 2017-06-23 DIAGNOSIS — Z9114 Patient's other noncompliance with medication regimen: Secondary | ICD-10-CM | POA: Diagnosis not present

## 2017-06-23 DIAGNOSIS — M159 Polyosteoarthritis, unspecified: Secondary | ICD-10-CM

## 2017-06-23 DIAGNOSIS — Z8719 Personal history of other diseases of the digestive system: Secondary | ICD-10-CM | POA: Diagnosis not present

## 2017-06-23 DIAGNOSIS — M150 Primary generalized (osteo)arthritis: Secondary | ICD-10-CM | POA: Diagnosis not present

## 2017-06-23 DIAGNOSIS — J410 Simple chronic bronchitis: Secondary | ICD-10-CM

## 2017-06-23 DIAGNOSIS — Z91148 Patient's other noncompliance with medication regimen for other reason: Secondary | ICD-10-CM

## 2017-06-23 DIAGNOSIS — J449 Chronic obstructive pulmonary disease, unspecified: Secondary | ICD-10-CM

## 2017-06-23 DIAGNOSIS — I10 Essential (primary) hypertension: Secondary | ICD-10-CM

## 2017-06-23 DIAGNOSIS — Z0000 Encounter for general adult medical examination without abnormal findings: Secondary | ICD-10-CM

## 2017-06-23 DIAGNOSIS — Z720 Tobacco use: Secondary | ICD-10-CM

## 2017-06-23 DIAGNOSIS — R6889 Other general symptoms and signs: Secondary | ICD-10-CM | POA: Diagnosis not present

## 2017-06-23 DIAGNOSIS — E782 Mixed hyperlipidemia: Secondary | ICD-10-CM | POA: Diagnosis not present

## 2017-06-23 DIAGNOSIS — Z0001 Encounter for general adult medical examination with abnormal findings: Secondary | ICD-10-CM

## 2017-06-23 MED ORDER — ALBUTEROL SULFATE HFA 108 (90 BASE) MCG/ACT IN AERS: 2.0000 | INHALATION_SPRAY | Freq: Four times a day (QID) | RESPIRATORY_TRACT | 1 refills | Status: AC | PRN

## 2017-06-23 MED ORDER — ATENOLOL 25 MG PO TABS: ORAL_TABLET | ORAL | 1 refills | Status: DC

## 2017-06-23 MED ORDER — DICLOFENAC SODIUM 1 % TD GEL: 4.0000 g | Freq: Four times a day (QID) | TRANSDERMAL | 3 refills | Status: DC

## 2017-06-23 MED ORDER — EZETIMIBE 10 MG PO TABS: ORAL_TABLET | ORAL | 1 refills | Status: DC

## 2017-06-23 NOTE — Patient Instructions (Addendum)
[facility]  7 a.m.-6:30 p.m., Monday 7 a.m.-5 p.m., Tuesday-Friday Schedule an appointment by calling [phone].  Encourage you to get the 3D Mammogram  The 3D Mammogram is much more specific and sensitive to pick up breast cancer. For women with fibrocystic breast or lumpy breast it can be hard to determine if it is cancer or not but the 3D mammogram is able to tell this difference which cuts back on unneeded additional tests or scary call backs.   - over 40% increase in detection of breast cancer - over 40% reduction in false positives.  - fewer call backs - reduced anxiety - improved outcomes - [facility]    Chronic Obstructive Pulmonary Disease Chronic obstructive pulmonary disease (COPD) is a long-term (chronic) condition that affects the lungs. COPD is a general term that can be used to describe many different lung problems that cause lung swelling (inflammation) and limit airflow, including chronic bronchitis and emphysema. If you have COPD, your lung function will probably never return to normal. In most cases, it gets worse over time. However, there are steps you can take to slow the progression of the disease and improve your quality of life. What are the causes? This condition may be caused by:  Smoking. This is the most common cause.  Certain genes passed down through families.  What increases the risk? The following factors may make you more likely to develop this condition:  Secondhand smoke from cigarettes, pipes, or cigars.  Exposure to chemicals and other irritants such as fumes and dust in the work environment.  Chronic lung conditions or infections.  What are the signs or symptoms? Symptoms of this condition include:  Shortness of breath, especially during physical activity.  Chronic cough with a large amount of thick mucus. Sometimes the cough may not have any mucus (dry cough).  Wheezing.  Rapid breaths.  Gray or  bluish discoloration (cyanosis) of the skin, especially in your fingers, toes, or lips.  Feeling tired (fatigue).  Weight loss.  Chest tightness.  Frequent infections.  Episodes when breathing symptoms become much worse (exacerbations).  Swelling in the ankles, feet, or legs. This may occur in later stages of the disease.  How is this diagnosed? This condition is diagnosed based on:  Your medical history.  A physical exam.  You may also have tests, including:  Lung (pulmonary) function tests. This may include a spirometry test, which measures your ability to exhale properly.  Chest X-ray.  CT scan.  Blood tests.  How is this treated? This condition may be treated with:  Medicines. These may include inhaled rescue medicines to treat acute exacerbations as well as long-term, or maintenance, medicines to prevent flare-ups of COPD. ? Bronchodilators help treat COPD by dilating the airways to allow increased airflow and make your breathing more comfortable. ? Steroids can reduce airway inflammation and help prevent exacerbations.  Smoking cessation. If you smoke, your health care provider may ask you to quit, and may also recommend therapy or replacement products to help you quit.  Pulmonary rehabilitation. This may involve working with a team of health care providers and specialists, such as respiratory, occupational, and physical therapists.  Exercise and physical activity. These are beneficial for nearly all people with COPD.  Nutrition therapy to gain weight, if you are underweight.  Oxygen. Supplemental oxygen therapy is only helpful if you have a low oxygen level in your blood (hypoxemia).  Lung surgery or transplant.  Palliative care.  This is to help people with COPD feel comfortable when treatment is no longer working.  Follow these instructions at home: Medicines  Take over-the-counter and prescription medicines (inhaled or pills) only as told by your health  care provider.  Talk to your health care provider before taking any cough or allergy medicines. You may need to avoid certain medicines that dry out your airways. Lifestyle  If you are a smoker, the most important thing that you can do is to stop smoking. Do not use any products that contain nicotine or tobacco, such as cigarettes and e-cigarettes. If you need help quitting, ask your health care provider. Continuing to smoke will cause the disease to progress faster.  Avoid exposure to things that irritate your lungs, such as smoke, chemicals, and fumes.  Stay active, but balance activity with periods of rest. Exercise and physical activity will help you maintain your ability to do things you want to do.  Learn and use relaxation techniques to manage stress and to control your breathing.  Get the right amount of sleep and get quality sleep. Most adults need 7 or more hours per night.  Eat healthy foods. Eating smaller, more frequent meals and resting before meals may help you maintain your strength. Controlled breathing Learn and use controlled breathing techniques as directed by your health care provider. Controlled breathing techniques include:  Pursed lip breathing. Start by breathing in (inhaling) through your nose for 1 second. Then, purse your lips as if you were going to whistle and breathe out (exhale) through the pursed lips for 2 seconds.  Diaphragmatic breathing. Start by putting one hand on your abdomen just above your waist. Inhale slowly through your nose. The hand on your abdomen should move out. Then purse your lips and exhale slowly. You should be able to feel the hand on your abdomen moving in as you exhale.  Controlled coughing Learn and use controlled coughing to clear mucus from your lungs. Controlled coughing is a series of short, progressive coughs. The steps of controlled coughing are: 1. Lean your head slightly forward. 2. Breathe in deeply using diaphragmatic  breathing. 3. Try to hold your breath for 3 seconds. 4. Keep your mouth slightly open while coughing twice. 5. Spit any mucus out into a tissue. 6. Rest and repeat the steps once or twice as needed.  General instructions  Make sure you receive all the vaccines that your health care provider recommends, especially the pneumococcal and influenza vaccines. Preventing infection and hospitalization is very important when you have COPD.  Use oxygen therapy and pulmonary rehabilitation if directed to by your health care provider. If you require home oxygen therapy, ask your health care provider whether you should purchase a pulse oximeter to measure your oxygen level at home.  Work with your health care provider to develop a COPD action plan. This will help you know what steps to take if your condition gets worse.  Keep other chronic health conditions under control as told by your health care provider.  Avoid extreme temperature and humidity changes.  Avoid contact with people who have an illness that spreads from person to person (is contagious), such as viral infections or pneumonia.  Keep all follow-up visits as told by your health care provider. This is important. Contact a health care provider if:  You are coughing up more mucus than usual.  There is a change in the color or thickness of your mucus.  Your breathing is more labored than usual.  Your breathing is faster than usual.  You have difficulty sleeping.  You need to use your rescue medicines or inhalers more often than expected.  You have trouble doing routine activities such as getting dressed or walking around the house. Get help right away if:  You have shortness of breath while you are resting.  You have shortness of breath that prevents you from: ? Being able to talk. ? Performing your usual physical activities.  You have chest pain lasting longer than 5 minutes.  Your skin color is more blue (cyanotic) than  usual.  You measure low oxygen saturations for longer than 5 minutes with a pulse oximeter.  You have a fever.  You feel too tired to breathe normally. Summary  Chronic obstructive pulmonary disease (COPD) is a long-term (chronic) condition that affects the lungs.  Your lung function will probably never return to normal. In most cases, it gets worse over time. However, there are steps you can take to slow the progression of the disease and improve your quality of life.  Treatment for COPD may include taking medicines, quitting smoking, pulmonary rehabilitation, and changes to diet and exercise. As the disease progresses, you may need oxygen therapy, a lung transplant, or palliative care.  To help manage your condition, do not smoke, avoid exposure to things that irritate your lungs, stay up to date on all vaccines, and follow your health care provider's instructions for taking medicines. This information is not intended to replace advice given to you by your health care provider. Make sure you discuss any questions you have with your health care provider. Document Released: 01/27/2005 Document Revised: 05/24/2016 Document Reviewed: 05/24/2016 Elsevier Interactive Patient Education  [employer].

## 2017-06-23 NOTE — Progress Notes (Signed)
MEDICARE ANNUAL WELLNESS VISIT AND FOLLOW UP  Assessment:   Chronic obstructive pulmonary disease, unspecified COPD type ([place]) -     albuterol (PROAIR HFA) 108 (90 Base) MCG/ACT inhaler; Inhale 2 puffs into the lungs every 6 (six) hours as needed for wheezing or shortness of breath. - not ready to quit smoking at this time - declines daily inhaler but will do albuterol - when to call the office was discussed, understands progression of disease  Interstitial lung disease ([place]) -     albuterol (PROAIR HFA) 108 (90 Base) MCG/ACT inhaler; Inhale 2 puffs into the lungs every 6 (six) hours as needed for wheezing or shortness of breath. - not ready to quit smoking at this time - declines daily inhaler but will do albuterol - when to call the office was discussed, understands progression of disease  Hyperlipidemia, mixed -continue medications, check lipids, decrease fatty foods, increase activity.  Declines statins -     ezetimibe (ZETIA) 10 MG tablet; Take 1 tablet daily for Cholesterol -     Lipid panel  Atherosclerosis of aorta (HCC) Control blood pressure, cholesterol, glucose, increase exercise.   History of diverticulitis Increase fiber  Hand arthritis -     diclofenac sodium (VOLTAREN) 1 % GEL; Apply 4 g topically 4 (four) times daily. - if not better can refer ortho  Essential hypertension - continue medications, DASH diet, exercise and monitor at home. Call if greater than 130/80.  -     CBC with Differential/Platelet -     BASIC METABOLIC PANEL WITH GFR -     Hepatic function panel -     TSH  Hyperlipidemia, unspecified hyperlipidemia type -continue medications, check lipids, decrease fatty foods, increase activity.  DECLINES STATIN DUE TO MUSCLE ACHES, UNDERSTANDS RISK OF MI/STROKE -     Lipid panel  Medication management -     Magnesium  Vitamin D deficiency -     VITAMIN D 25 Hydroxy (Vit-D Deficiency, Fractures)  Primary osteoarthritis involving multiple  joints MONITOR  Body mass index (BMI) of 22.90 in adult Add ensure/boost  Poor compliance with medication Continue meds  Encounter for Medicare annual wellness exam 1 year follow up Declines MGM Colonoscopy- patient declines a colonoscopy even though the risks and benefits were discussed at length. Colon cancer is 3rd most diagnosed cancer and 2nd leading cause of death in both men and women 56 years of age and older. Patient understands the risk of cancer and death with declining the test.  Tobacco use Smoking cessation-  instruction/counseling given, counseled patient on the dangers of tobacco use, advised patient to stop smoking, and reviewed strategies to maximize success, patient not ready to quit at this time.  -     DG Chest 2 View; Future  Smokers' cough [facility]) Get CXR  Advanced care planning/counseling discussion [name], will bring in copy Discussed advanced care planning with patient   Over 30 minutes of exam, counseling, chart review and critical decision making was performed Future Appointments  Date Time Provider [place]  12/22/2017  2:00 PM Stacy Pinto, MD [facility] None     Subjective:  Stacy [name] is a 72 y.o. female who presents for Medicare Annual Wellness Visit and 3 MONTH FOLLOW UP.    She had first diverticulitis episode in Dec.  She is DNR.   She has had elevated blood pressure for several years controlled with diet and exercise. Her blood pressure has been controlled at home, today their BP is  BP: 118/64 [place]. She denies chest pain, shortness of breath, dizziness.   She works out in the yard and also does some walking everyday.   She is on cholesterol medication  Her cholesterol is not at goal. The cholesterol last visit was:  Started zetia last visit, can not tolerate statins.  Lab Results  Component Value Date   CHOL 270 (H) 03/23/2017   HDL 70 03/23/2017   LDLCALC 170 (H) 12/01/2016   TRIG 271  (H) 03/23/2017   CHOLHDL 3.9 03/23/2017  She is on zetia, unable to tolerate statins.   She does watch her diet as she has asymptomatic prediabetes.   Lab Results  Component Value Date   HGBA1C 5.6 03/23/2017   She will take xanax PRN for sleep.   Last GFR: Lab Results  Component Value Date   GFRNONAA 47 (L) 04/18/2017   Patient is on Vitamin D supplement, on 10,000 a day since last visit.   Lab Results  Component Value Date   VD25OH 48 03/23/2017     BMI is Body mass index is 22.41 kg/m., she is working on diet and exercise. Wt Readings from Last 3 Encounters:  06/23/17 118 lb 9.6 oz (53.8 kg)  04/18/17 116 lb (52.6 kg)  03/23/17 114 lb (51.7 kg)     Medication Review: Current Outpatient Medications on File Prior to Visit  Medication Sig  . ALPRAZolam (XANAX) 0.5 MG tablet TAKE ONE-HALF TO ONE TABLET BY MOUTH TWICE DAILY FOR ANXIETY/SLEEP  . aspirin 81 MG tablet Take 162 mg by mouth daily.   Marland Kitchen atenolol (TENORMIN) 25 MG tablet Take 1 tablet daily for BP (Patient taking differently: Take 25 mg by mouth daily. Take 1 tablet daily for BP)  . Calcium Carbonate-Vitamin D (CALCIUM-VITAMIN D) 500-200 MG-UNIT per tablet Take 2 tablets by mouth daily.   . cholecalciferol (VITAMIN D) 1000 units tablet Take 5,000 Units by mouth daily.  Marland Kitchen HYDROcodone-acetaminophen (NORCO) 5-325 MG tablet Take 1-2 tablets by mouth every 6 (six) hours as needed.  . vitamin C (ASCORBIC ACID) 500 MG tablet Take 1,000 mg by mouth daily.   Marland Kitchen ezetimibe (ZETIA) 10 MG tablet Take 1 tablet daily for Cholesterol (Patient taking differently: Take 10 mg by mouth daily. Take 1 tablet daily for Cholesterol)   No current facility-administered medications on file prior to visit.      Allergies  Allergen Reactions  . Dilaudid [Hydromorphone Hcl] Shortness Of Breath and Swelling  . Fentanyl Shortness Of Breath and Swelling  . Penicillins Anaphylaxis    Has patient had a PCN reaction causing immediate rash,  facial/tongue/throat swelling, SOB or lightheadedness with hypotension: Yes Has patient had a PCN reaction causing severe rash involving mucus membranes or skin necrosis: No Has patient had a PCN reaction that required hospitalization: Yes Has patient had a PCN reaction occurring within the last 10 years: No If all of the above answers are "NO", then may proceed with Cephalosporin use.   . Crestor [Rosuvastatin] Other (See Comments)    Joint pain, cramping     Current Problems (verified) Patient Active Problem List   Diagnosis Date Noted  . Smokers' cough ([place]) 06/23/2017  . COPD (chronic obstructive pulmonary disease) ([place]) 04/02/2017  . Interstitial lung disease ([place]) 04/02/2017  . Tobacco use 03/23/2017  . Poor compliance with medication 02/17/2015  . Medication management 06/10/2014  . Hypertension   . Hyperlipidemia   . Vitamin D deficiency   . DJD (degenerative joint  disease)     Screening Tests Immunization History  Administered Date(s) Administered  . Influenza-Unspecified 01/31/2014, 01/02/2015  . Pneumococcal Conjugate-13 12/01/2016  . Pneumococcal Polysaccharide-23 08/21/2012  . Td 05/04/2003  . Tdap 11/08/2013   Preventative care: Last colonoscopy: 2011 cologuard declined has CT AB 04/2017 Last mammogram: 2015 Number given to call Last pap smear/pelvic exam:  DEXA: 2015 CXR 2018  Prior vaccinations: TD or Tdap: 2015  Influenza: 2016 Declines Pneumococcal: 2014 Prevnar13:  2018 Shingles/Zostavax: N/A  Names of Other Physician/Practitioners you currently use: 1. [facility] here for primary care 2. Dr. Ronnald Ramp, eye doctor, last visit 2018 3. Dr. Lynnette Caffey, dentist, last visit 2018 Patient Care Team: Stacy Pinto, MD as PCP - General (Internal Medicine) Etta Quill, [place] as Referring Physician (Optometry) Luretha Rued, Carney Living, DDS (Dentistry) Sypher, Herbie [place], MD (Inactive) as Consulting Physician (Orthopedic  Surgery) Marylynn Pearson, MD as Consulting Physician (Obstetrics and Gynecology) Juanita Craver, MD as Consulting Physician (Gastroenterology)  SURGICAL HISTORY She  has a past surgical history that includes Neck surgery (2012 ); Laminectomy (2002); Hand surgery; Trigger finger release; head injury (1995); and Cataract extraction (Right, 03/17/2017). FAMILY HISTORY Her family history includes Cancer - Other in her father and mother; Diabetes in her mother; Heart attack (age of onset: 26) in her father; Heart attack (age of onset: 40) in her mother; Heart disease in her mother; Hyperlipidemia in her mother; Hypertension in her father. SOCIAL HISTORY She  reports that she has been smoking.  she has never used smokeless tobacco. She reports that she does not drink alcohol.   MEDICARE WELLNESS OBJECTIVES: Physical activity:   Cardiac risk factors:   Depression/mood screen:   Depression screen [facility] 2/9 03/23/2017  Decreased Interest 0  Down, Depressed, Hopeless 0  PHQ - 2 Score 0    ADLs:  In your present state of health, do you have any difficulty performing the following activities: 03/23/2017 12/05/2016  Hearing? N N  Vision? N N  Difficulty concentrating or making decisions? N N  Walking or climbing stairs? N N  Dressing or bathing? N N  Doing errands, shopping? N N  Some recent data might be hidden     Cognitive Testing  Alert? Yes  Normal Appearance?Yes  Oriented to person? Yes  Place? Yes   Time? Yes  Recall of three objects?  Yes  Can perform simple calculations? Yes  Displays appropriate judgment?Yes  Can read the correct time from a watch face?Yes  EOL planning: Does Patient Have a Medical Advance Directive?: Yes Type of Advance Directive: Healthcare Power of Attorney, Living will [place] in Chart?: No - copy requested  Review of Systems  Constitutional: Negative for chills, fever and malaise/fatigue.  HENT: Negative for congestion, ear  pain and sore throat.   Eyes: Negative.   Respiratory: Negative for cough, shortness of breath and wheezing.   Cardiovascular: Negative for chest pain, palpitations and leg swelling.  Gastrointestinal: Negative for abdominal pain, blood in stool, constipation, diarrhea, heartburn and melena.  Genitourinary: Negative.   Skin: Negative.   Neurological: Negative for dizziness, sensory change, loss of consciousness and headaches.  Psychiatric/Behavioral: Negative for depression. The patient is not nervous/anxious and does not have insomnia.      Objective:     Today's Vitals   06/23/17 1419  BP: 118/64  Pulse: 62  Resp: 16  Temp: 97.8 F (36.6 C)  SpO2: 97%  Weight: 118 lb 9.6 oz (53.8 kg)  Height:  5\' 1"  (1.549 m)  PainSc: 5   PainLoc: Finger   Body mass index is 22.41 kg/m.  General appearance: alert, skinny, no distress, WD/WN, female HEENT: normocephalic, sclerae anicteric, TMs pearly, nares patent, no discharge or erythema, pharynx normal Oral cavity: MMM, no lesions Neck: supple, + right cervical/submandicular lymphadenopathy nontender, hard, mobile, no thyromegaly, no masses Heart: RRR, normal S1, S2, no murmurs Lungs: CTA bilaterally, + wheezes bilateral lower lobes without rhonchi, or rales Abdomen: +bs, soft, non tender, non distended, no masses, no hepatomegaly, no splenomegaly Musculoskeletal: nontender, no swelling, no obvious deformity Extremities: no edema, no cyanosis, no clubbing Pulses: 2+ symmetric, upper and lower extremities, normal cap refill Neurological: alert, oriented x 3, CN2-12 intact, strength normal upper extremities and lower extremities, sensation normal throughout, DTRs 2+ throughout, no cerebellar signs, gait normal Psychiatric: normal affect, behavior normal, pleasant   Medicare Attestation I have personally reviewed: The patient's medical and social history Their use of alcohol, tobacco or illicit drugs Their current medications and  supplements The patient's functional ability including ADLs,fall risks, home safety risks, cognitive, and hearing and visual impairment Diet and physical activities Evidence for depression or mood disorders  The patient's weight, height, BMI, and visual acuity have been recorded in the chart.  I have made referrals, counseling, and provided education to the patient based on review of the above and I have provided the patient with a written personalized care plan for preventive services.     Vicie Mutters, PA-C   06/23/2017

## 2017-06-24 LAB — CBC WITH DIFFERENTIAL/PLATELET
BASOS ABS: 49 {cells}/uL (ref 0–200)
BASOS PCT: 0.7 %
EOS PCT: 3 %
Eosinophils Absolute: 210 cells/uL (ref 15–500)
HEMATOCRIT: 40.2 % (ref 35.0–45.0)
HEMOGLOBIN: 13.7 g/dL (ref 11.7–15.5)
LYMPHS ABS: 3185 {cells}/uL (ref 850–3900)
MCH: 32.9 pg (ref 27.0–33.0)
MCHC: 34.1 g/dL (ref 32.0–36.0)
MCV: 96.6 fL (ref 80.0–100.0)
MPV: 10.2 fL (ref 7.5–12.5)
Monocytes Relative: 5.7 %
NEUTROS ABS: 3157 {cells}/uL (ref 1500–7800)
Neutrophils Relative %: 45.1 %
Platelets: 253 10*3/uL (ref 140–400)
RBC: 4.16 10*6/uL (ref 3.80–5.10)
RDW: 12.6 % (ref 11.0–15.0)
Total Lymphocyte: 45.5 %
WBC mixed population: 399 cells/uL (ref 200–950)
WBC: 7 10*3/uL (ref 3.8–10.8)

## 2017-06-24 LAB — BASIC METABOLIC PANEL WITH GFR
BUN: 10 mg/dL (ref 7–25)
CALCIUM: 9.4 mg/dL (ref 8.6–10.4)
CO2: 28 mmol/L (ref 20–32)
CREATININE: 0.76 mg/dL (ref 0.60–0.93)
Chloride: 105 mmol/L (ref 98–110)
GFR, EST NON AFRICAN AMERICAN: 78 mL/min/{1.73_m2} (ref >=60)
GFR, Est African American: 91 mL/min/{1.73_m2} (ref >=60)
Glucose, Bld: 79 mg/dL (ref 65–99)
Potassium: 4.5 mmol/L (ref 3.5–5.3)
Sodium: 138 mmol/L (ref 135–146)

## 2017-06-24 LAB — HEPATIC FUNCTION PANEL
AG RATIO: 1.4 (calc) (ref 1.0–2.5)
ALBUMIN MSPROF: 4.3 g/dL (ref 3.6–5.1)
ALKALINE PHOSPHATASE (APISO): 74 U/L (ref 33–130)
ALT: 9 U/L (ref 6–29)
AST: 15 U/L (ref 10–35)
BILIRUBIN TOTAL: 0.3 mg/dL (ref 0.2–1.2)
Bilirubin, Direct: 0.1 mg/dL (ref 0.0–0.2)
Globulin: 3 g/dL (calc) (ref 1.9–3.7)
Indirect Bilirubin: 0.2 mg/dL (calc) (ref 0.2–1.2)
TOTAL PROTEIN: 7.3 g/dL (ref 6.1–8.1)

## 2017-06-24 LAB — LIPID PANEL
CHOLESTEROL: 235 mg/dL — AB (ref <200)
HDL: 68 mg/dL (ref >50)
LDL Cholesterol (Calc): 134 mg/dL (calc) — ABNORMAL HIGH
NON-HDL CHOLESTEROL (CALC): 167 mg/dL — AB (ref <130)
TRIGLYCERIDES: 191 mg/dL — AB (ref <150)
Total CHOL/HDL Ratio: 3.5 (calc) (ref <5.0)

## 2017-06-24 LAB — TSH: TSH: 2.02 mIU/L (ref 0.40–4.50)

## 2017-11-23 DIAGNOSIS — L03116 Cellulitis of left lower limb: Secondary | ICD-10-CM | POA: Diagnosis not present

## 2017-11-23 DIAGNOSIS — S90562A Insect bite (nonvenomous), left ankle, initial encounter: Secondary | ICD-10-CM | POA: Diagnosis not present

## 2017-12-22 ENCOUNTER — Encounter: Payer: Self-pay | Admitting: Internal Medicine

## 2017-12-27 ENCOUNTER — Other Ambulatory Visit: Payer: Self-pay

## 2017-12-27 ENCOUNTER — Other Ambulatory Visit: Payer: Self-pay | Admitting: Internal Medicine

## 2017-12-27 NOTE — Telephone Encounter (Signed)
PMP checked, last filled on 06/16/17, #60, 30 day supply. Last office visit on 06/23/17, next office visit on 02/14/18. Marland Kitchen

## 2018-01-20 ENCOUNTER — Emergency Department (HOSPITAL_BASED_OUTPATIENT_CLINIC_OR_DEPARTMENT_OTHER)
Admission: EM | Admit: 2018-01-20 | Discharge: 2018-01-20 | Disposition: A | Discharge status: Home / Self Care | Payer: PPO | Attending: Emergency Medicine | Admitting: Emergency Medicine

## 2018-01-20 ENCOUNTER — Emergency Department (HOSPITAL_BASED_OUTPATIENT_CLINIC_OR_DEPARTMENT_OTHER): Payer: PPO

## 2018-01-20 ENCOUNTER — Other Ambulatory Visit: Payer: Self-pay

## 2018-01-20 ENCOUNTER — Encounter (HOSPITAL_BASED_OUTPATIENT_CLINIC_OR_DEPARTMENT_OTHER): Payer: Self-pay | Admitting: *Deleted

## 2018-01-20 DIAGNOSIS — Y9301 Activity, walking, marching and hiking: Secondary | ICD-10-CM | POA: Diagnosis not present

## 2018-01-20 DIAGNOSIS — S0003XA Contusion of scalp, initial encounter: Secondary | ICD-10-CM | POA: Diagnosis not present

## 2018-01-20 DIAGNOSIS — F17210 Nicotine dependence, cigarettes, uncomplicated: Secondary | ICD-10-CM | POA: Insufficient documentation

## 2018-01-20 DIAGNOSIS — W19XXXA Unspecified fall, initial encounter: Secondary | ICD-10-CM

## 2018-01-20 DIAGNOSIS — H5711 Ocular pain, right eye: Secondary | ICD-10-CM | POA: Insufficient documentation

## 2018-01-20 DIAGNOSIS — Y999 Unspecified external cause status: Secondary | ICD-10-CM | POA: Diagnosis not present

## 2018-01-20 DIAGNOSIS — J449 Chronic obstructive pulmonary disease, unspecified: Secondary | ICD-10-CM | POA: Diagnosis not present

## 2018-01-20 DIAGNOSIS — Z79899 Other long term (current) drug therapy: Secondary | ICD-10-CM | POA: Insufficient documentation

## 2018-01-20 DIAGNOSIS — W0110XA Fall on same level from slipping, tripping and stumbling with subsequent striking against unspecified object, initial encounter: Secondary | ICD-10-CM | POA: Insufficient documentation

## 2018-01-20 DIAGNOSIS — Z7982 Long term (current) use of aspirin: Secondary | ICD-10-CM | POA: Diagnosis not present

## 2018-01-20 DIAGNOSIS — S0993XA Unspecified injury of face, initial encounter: Secondary | ICD-10-CM | POA: Diagnosis not present

## 2018-01-20 DIAGNOSIS — I10 Essential (primary) hypertension: Secondary | ICD-10-CM | POA: Insufficient documentation

## 2018-01-20 DIAGNOSIS — R51 Headache: Secondary | ICD-10-CM | POA: Diagnosis not present

## 2018-01-20 DIAGNOSIS — Y929 Unspecified place or not applicable: Secondary | ICD-10-CM | POA: Insufficient documentation

## 2018-01-20 DIAGNOSIS — R55 Syncope and collapse: Secondary | ICD-10-CM | POA: Diagnosis present

## 2018-01-20 LAB — COMPREHENSIVE METABOLIC PANEL
ALT: 11 U/L (ref 0–44)
ANION GAP: 10 (ref 5–15)
AST: 20 U/L (ref 15–41)
Albumin: 4.1 g/dL (ref 3.5–5.0)
Alkaline Phosphatase: 91 U/L (ref 38–126)
BUN: 12 mg/dL (ref 8–23)
CALCIUM: 9.7 mg/dL (ref 8.9–10.3)
CHLORIDE: 101 mmol/L (ref 98–111)
CO2: 26 mmol/L (ref 22–32)
Creatinine, Ser: 0.89 mg/dL (ref 0.44–1.00)
GFR calc non Af Amer: >60 mL/min (ref >60)
Glucose, Bld: 87 mg/dL (ref 70–99)
POTASSIUM: 3.7 mmol/L (ref 3.5–5.1)
SODIUM: 137 mmol/L (ref 135–145)
Total Bilirubin: 0.3 mg/dL (ref 0.3–1.2)
Total Protein: 7.9 g/dL (ref 6.5–8.1)

## 2018-01-20 LAB — CBC WITH DIFFERENTIAL/PLATELET
BASOS PCT: 0 %
Basophils Absolute: 0 10*3/uL (ref 0.0–0.1)
EOS ABS: 0.2 10*3/uL (ref 0.0–0.7)
EOS PCT: 2 %
HCT: 42.9 % (ref 36.0–46.0)
Hemoglobin: 14.7 g/dL (ref 12.0–15.0)
LYMPHS ABS: 3 10*3/uL (ref 0.7–4.0)
Lymphocytes Relative: 42 %
MCH: 33.9 pg (ref 26.0–34.0)
MCHC: 34.3 g/dL (ref 30.0–36.0)
MCV: 98.8 fL (ref 78.0–100.0)
MONOS PCT: 7 %
Monocytes Absolute: 0.5 10*3/uL (ref 0.1–1.0)
Neutro Abs: 3.5 10*3/uL (ref 1.7–7.7)
Neutrophils Relative %: 49 %
PLATELETS: 228 10*3/uL (ref 150–400)
RBC: 4.34 MIL/uL (ref 3.87–5.11)
RDW: 12.5 % (ref 11.5–15.5)
WBC: 7.2 10*3/uL (ref 4.0–10.5)

## 2018-01-20 MED ORDER — TETRACAINE HCL 0.5 % OP SOLN
2.0000 [drp] | Freq: Once | OPHTHALMIC | Status: AC
  Administered 2018-01-20: 2 [drp] via OPHTHALMIC
  Filled 2018-01-20: qty 4

## 2018-01-20 MED ORDER — FLUORESCEIN SODIUM 1 MG OP STRP
1.0000 | ORAL_STRIP | Freq: Once | OPHTHALMIC | Status: AC
  Administered 2018-01-20: 1 via OPHTHALMIC
  Filled 2018-01-20: qty 1

## 2018-01-20 MED ORDER — ACETAMINOPHEN 325 MG PO TABS
650.0000 mg | ORAL_TABLET | Freq: Once | ORAL | Status: AC
  Administered 2018-01-20: 650 mg via ORAL
  Filled 2018-01-20: qty 2

## 2018-01-20 NOTE — Discharge Instructions (Signed)
Please schedule appointment with your eye doctor to further evaluate your vision.If your symptoms worsen or you experience any dizziness, worsening vision, you may return to the ED for reevaluation.

## 2018-01-20 NOTE — ED Provider Notes (Signed)
[facility] Provider Note   CSN: [id number] Arrival date & time: 01/20/18  1738     History   Chief Complaint Chief Complaint  Patient presents with  . Fall  . Loss of Consciousness    HPI NKENGE [name] is a 72 y.o. female.  72 y/o female with a PMH of HTN presents to the ED s/p mechanical fall this evening. Patient states she was doing her yard work when she tripped on a bag with soil and fell onto her face landing on the grass. She reports falling on her left facial side and body. She states the next thing she remembers is waking up to her dogs liking her face. She reports a headache with pain along the left side of her face along with left eye "feels swollen" and blurry vision.  Patient denies any blood thinner usage. She states she did have cataract surgery to both eyes years ago. Patient states she would not have come in if it wasn't for the blurry vision. She denies any shortness of breath, neck pain,chest pain or other complaints.      Past Medical History:  Diagnosis Date  . Carotid bruit    Bilateral  . Colon polyps    with divertirticulitis  . DJD (degenerative joint disease)   . Hyperlipidemia   . Hypertension   . Osteopenia   . Prediabetes   . Vitamin D deficiency     Patient Active Problem List   Diagnosis Date Noted  . Smokers' cough ([place]) 06/23/2017  . Atherosclerosis of aorta ([place]) 06/23/2017  . History of diverticulitis 06/23/2017  . COPD (chronic obstructive pulmonary disease) ([place]) 04/02/2017  . Interstitial lung disease ([place]) 04/02/2017  . Tobacco use 03/23/2017  . Poor compliance with medication 02/17/2015  . Medication management 06/10/2014  . Hypertension   . Hyperlipidemia   . Vitamin D deficiency   . DJD (degenerative joint disease)     Past Surgical History:  Procedure Laterality Date  . CATARACT EXTRACTION Right 03/17/2017  . HAND SURGERY    . head injury  1995  . LAMINECTOMY  2002   L 5   .  NECK SURGERY  2012    c4 -5/6  fushion  . TRIGGER FINGER RELEASE       OB History   None      Home Medications    Prior to Admission medications   Medication Sig Start Date End Date Taking? Authorizing Provider  albuterol (PROAIR HFA) 108 (90 Base) MCG/ACT inhaler Inhale 2 puffs into the lungs every 6 (six) hours as needed for wheezing or shortness of breath. 06/23/17   Vicie Mutters, PA-C  ALPRAZolam Duanne Moron) 0.5 MG tablet TAKE 1/2 TO 1 (ONE-HALF TO ONE) TABLET BY MOUTH TWICE DAILY AS NEEDED FOR ANXIETY OR  SLEEP 12/27/17   Vicie Mutters, PA-C  aspirin 81 MG tablet Take 162 mg by mouth daily.     [provider]  atenolol (TENORMIN) 25 MG tablet Take 1 tablet daily for BP 06/23/17 06/23/18  Vicie Mutters, PA-C  Calcium Carbonate-Vitamin D (CALCIUM-VITAMIN D) 500-200 MG-UNIT per tablet Take 2 tablets by mouth daily.     [provider]  cholecalciferol (VITAMIN D) 1000 units tablet Take 5,000 Units by mouth daily.    [provider]  diclofenac sodium (VOLTAREN) 1 % GEL Apply 4 g topically 4 (four) times daily. 06/23/17   Vicie Mutters, PA-C  ezetimibe (ZETIA) 10 MG tablet Take 1 tablet daily for Cholesterol  06/23/17 12/24/17  Vicie Mutters, PA-C  HYDROcodone-acetaminophen (NORCO) 5-325 MG tablet Take 1-2 tablets by mouth every 6 (six) hours as needed. 04/18/17   Veryl Speak, MD  vitamin C (ASCORBIC ACID) 500 MG tablet Take 1,000 mg by mouth daily.     [provider]    Family History Family History  Problem Relation Age of Onset  . Hyperlipidemia Mother   . Heart disease Mother        7 way bypass/ value replacement/pacemaker  . Heart attack Mother 72  . Diabetes Mother   . Cancer - Other Mother        lung  . Hypertension Father   . Heart attack Father 32  . Cancer - Other Father        throat    Social History Social History   Tobacco Use  . Smoking status: Current Every Day Smoker  . Smokeless tobacco: Never Used  . Tobacco  comment: smokes 2 cigarettes per day  Substance Use Topics  . Alcohol use: No  . Drug use: Not on file     Allergies   Dilaudid [hydromorphone hcl]; Fentanyl; Penicillins; and Crestor [rosuvastatin]   Review of Systems Review of Systems  Constitutional: Negative for chills and fever.  HENT: Negative for sinus pressure.   Eyes: Positive for pain and visual disturbance. Negative for discharge and redness.  Respiratory: Negative for shortness of breath.   Cardiovascular: Negative for chest pain and palpitations.  Gastrointestinal: Negative for abdominal pain.  Genitourinary: Negative for flank pain and hematuria.  Musculoskeletal: Negative for back pain, neck pain and neck stiffness.  Neurological: Positive for headaches. Negative for syncope.  All other systems reviewed and are negative.    Physical Exam Updated Vital Signs BP 118/70 (BP Location: Right Arm)   Pulse 66   Temp 98.3 F (36.8 C) (Oral)   Resp 16   Ht 5' 1.5" (1.562 m)   Wt 50.8 kg   SpO2 96%   BMI 20.82 kg/m   Physical Exam  Constitutional: She is oriented to person, place, and time. She appears well-developed and well-nourished.  HENT:  Head: Normocephalic. Head is without right periorbital erythema and without left periorbital erythema.    No racoon's eyes, no battle signs, no Hemotympanum. Pain along left upper orbital arch.   Eyes: Right eye exhibits no discharge. Left eye exhibits no discharge. Right eye exhibits normal extraocular motion. Left eye exhibits normal extraocular motion.  Slit lamp exam:      The left eye shows no corneal abrasion, no corneal flare, no corneal ulcer, no hyphema, no fluorescein uptake and no anterior chamber bulge.  Tono pen 17  No left fluorescein uptake, corneal ulcer, corneal abrasion, uptake noted on exam.  No pain with eye movement.  Neck: Normal range of motion. Neck supple.  Cardiovascular: Normal heart sounds.  Pulmonary/Chest: Breath sounds normal.    Abdominal: Soft. Bowel sounds are normal. There is no tenderness.  Lymphadenopathy:    She has no cervical adenopathy.  Neurological: She is alert and oriented to person, place, and time.  Skin: Skin is warm and dry.  Nursing note and vitals reviewed.   Visual Acuity  Right Eye Distance:   Left Eye Distance:   Bilateral Distance:    Right Eye Near: R Near: 20/50 Left Eye Near:  L Near: 20/70 Bilateral Near:  20/40    ED Treatments / Results  Labs (all labs ordered are listed, but only abnormal results are displayed) Labs  Reviewed  CBC WITH DIFFERENTIAL/PLATELET  COMPREHENSIVE METABOLIC PANEL    EKG None  Radiology Ct Head Wo Contrast  Result Date: 01/20/2018 CLINICAL DATA:  Pt fell, tripped over a bag of manure and landed on face; pt has headache and blurry vision in left eye; pt has no h/o stroke; no dizziness; pt takes aspirin daily; pt has HBP EXAM: CT HEAD WITHOUT CONTRAST CT MAXILLOFACIAL WITHOUT CONTRAST TECHNIQUE: Multidetector CT imaging of the head and maxillofacial structures were performed using the standard protocol without intravenous contrast. Multiplanar CT image reconstructions of the maxillofacial structures were also generated. COMPARISON:  None. FINDINGS: CT HEAD FINDINGS Brain: No evidence of acute infarction, hemorrhage, hydrocephalus, extra-axial collection or mass lesion/mass effect. Mild patchy periventricular white matter hypoattenuation is noted consistent with chronic microvascular ischemic change. Vascular: No hyperdense vessel or unexpected calcification. Skull: Normal. Negative for fracture or focal lesion. Other: Small right forehead scalp contusion. CT MAXILLOFACIAL FINDINGS Osseous: No fracture or mandibular dislocation. No destructive process. Orbits: Negative. No traumatic or inflammatory finding. Sinuses: Clear.  Clear mastoid air cells and middle ear cavities. Soft tissues: Minor contusion in the right supraorbital region. No other soft tissue  abnormality. IMPRESSION: HEAD CT 1. No acute intracranial abnormalities.  No skull fracture. MAXILLOFACIAL CT 1. No fractures. 2. No injury to either globe or orbit. Electronically Signed   By: Lajean Manes M.D.   On: 01/20/2018 19:45   Ct Maxillofacial Wo Contrast  Result Date: 01/20/2018 CLINICAL DATA:  Pt fell, tripped over a bag of manure and landed on face; pt has headache and blurry vision in left eye; pt has no h/o stroke; no dizziness; pt takes aspirin daily; pt has HBP EXAM: CT HEAD WITHOUT CONTRAST CT MAXILLOFACIAL WITHOUT CONTRAST TECHNIQUE: Multidetector CT imaging of the head and maxillofacial structures were performed using the standard protocol without intravenous contrast. Multiplanar CT image reconstructions of the maxillofacial structures were also generated. COMPARISON:  None. FINDINGS: CT HEAD FINDINGS Brain: No evidence of acute infarction, hemorrhage, hydrocephalus, extra-axial collection or mass lesion/mass effect. Mild patchy periventricular white matter hypoattenuation is noted consistent with chronic microvascular ischemic change. Vascular: No hyperdense vessel or unexpected calcification. Skull: Normal. Negative for fracture or focal lesion. Other: Small right forehead scalp contusion. CT MAXILLOFACIAL FINDINGS Osseous: No fracture or mandibular dislocation. No destructive process. Orbits: Negative. No traumatic or inflammatory finding. Sinuses: Clear.  Clear mastoid air cells and middle ear cavities. Soft tissues: Minor contusion in the right supraorbital region. No other soft tissue abnormality. IMPRESSION: HEAD CT 1. No acute intracranial abnormalities.  No skull fracture. MAXILLOFACIAL CT 1. No fractures. 2. No injury to either globe or orbit. Electronically Signed   By: Lajean Manes M.D.   On: 01/20/2018 19:45    Procedures Procedures (including critical care time)  Medications Ordered in ED Medications  acetaminophen (TYLENOL) tablet 650 mg (650 mg Oral Given 01/20/18  1823)  fluorescein ophthalmic strip 1 strip (1 strip Left Eye Given by Other 01/20/18 1827)  tetracaine (PONTOCAINE) 0.5 % ophthalmic solution 2 drop (2 drops Left Eye Given by Other 01/20/18 1827)     Initial Impression / Assessment and Plan / ED Course  I have reviewed the triage vital signs and the nursing notes.  Pertinent labs & imaging results that were available during my care of the patient were reviewed by me and considered in my medical decision making (see chart for details).     Presents with left facial pain s/p mechanical fall this evening during yard work.  CT Head was ordered to r/o any intracranial hemorrhage. CT maxillofacial obtained to r/o any maxillofacial fractures as patient states she landed face first. CT Head was negative for intracranial hemorrhage or acute abnormality. CT Maxillofacial showed no  Fractures, globe or orbit injury. Further examination of the eye was done with tono pen pressure within normal limits at 17, I believe a globe rupture is less likely. She denies any pain with eye movement. CT does show a contusion to the right supraorbital region. CBC was obtained to r/o any sources of infection that preceded the fall, no leukocytosis, no signs of infection patient is afebrile. CMP showed no electrolyte abnormality no hypokalemia.  Patient has had a previous history of cataract surgery to both eyes. She received pain medication while in the ED which relieve her symptoms.  Patient denies any pain with eye movement or decrease in eye movement, along with negative CT I believe fracture is less likely. There is no irregular pupils or seidel sign less likely to be globe rupture. No hyphema noted on exam. Patient stable for discharge, vitals stable for discharge. Patient will be instructed to schedule appointment with ophthalmology for a recheck.   Final Clinical Impressions(s) / ED Diagnoses   Final diagnoses:  Fall, initial encounter    ED Discharge Orders     None       Corinna Capra 01/20/18 2033    Merrily Pew, MD 01/20/18 2136

## 2018-01-20 NOTE — ED Provider Notes (Signed)
Medical screening examination/treatment/procedure(s) were conducted as a shared visit with non-physician practitioner(s) and myself.  I personally evaluated the patient during the encounter.  72 year old female who had a mechanical fall and subsequent syncope after hitting the ground.  She woke up and noticed that her left eye seemed to be blurry and she had pain in the left side of her face as well.  Comes here for evaluation. I am she appears to be atraumatic.  She has full extraocular movements.  Pupils are equal round reactive bilaterally. Plan for CT brain and face along with intraocular pressures and [place] lamp examination.  These are all normal patient's vision improves no further work-up required and follow-up with eye doctor.   Merrily Pew, MD 01/20/18 2134

## 2018-01-20 NOTE — ED Triage Notes (Signed)
She was working in her yard and slipped. She hit the left side of her face. Abrasions noted. She now has blurred vision in her left eye. She is alert and oriented.

## 2018-02-06 ENCOUNTER — Other Ambulatory Visit: Payer: Self-pay | Admitting: Physician Assistant

## 2018-02-06 DIAGNOSIS — E782 Mixed hyperlipidemia: Secondary | ICD-10-CM

## 2018-02-13 ENCOUNTER — Encounter: Payer: Self-pay | Admitting: Internal Medicine

## 2018-02-13 ENCOUNTER — Other Ambulatory Visit: Payer: Self-pay | Admitting: Internal Medicine

## 2018-02-13 DIAGNOSIS — Z1231 Encounter for screening mammogram for malignant neoplasm of breast: Secondary | ICD-10-CM

## 2018-02-13 NOTE — Progress Notes (Signed)
[facility] [name], M.D.     Uvaldo Bristle. Silverio Lay, P.A.-C Liane Comber, [place] [address] [place] [address] Telephone [phone] Telefax [phone] Annual Screening/Preventative Visit & Comprehensive Evaluation &  Examination     This very nice 72 y.o. DWF presents for a Screening /Preventative Visit & comprehensive evaluation and management of multiple medical co-morbidities.  Patient has been followed for HTN, HLD, Prediabetes  and Vitamin D Deficiency.      HTN predates circa 1995. Patient's BP has been controlled at home and patient denies any cardiac symptoms as chest pain, palpitations, shortness of breath, dizziness or ankle swelling. Today's BP is at goal - 114/62.      Patient's hyperlipidemia is not controlled with diet & Zetia  as patient refuses statins.  Last lipids were not at goal: Lab Results  Component Value Date   CHOL 235 (H) 06/23/2017   HDL 68 06/23/2017   LDLCALC 134 (H) 06/23/2017   TRIG 191 (H) 06/23/2017   CHOLHDL 3.5 06/23/2017      Patient has hx/o prediabetes (A1c 6.4% in 2009) and patient denies reactive hypoglycemic symptoms, visual blurring, diabetic polys or paresthesias. Last A1c was Normal & at goal: Lab Results  Component Value Date   HGBA1C 5.6 03/23/2017      Finally, patient has history of Vitamin D Deficiency ("7" in 2009) and last Vitamin D was sl low (goal 70-100): Lab Results  Component Value Date   VD25OH 48 03/23/2017   Current Outpatient Medications on File Prior to Visit  Medication Sig  . albuterol (PROAIR HFA) 108 (90 Base) MCG/ACT inhaler Inhale 2 puffs into the lungs every 6 (six) hours as needed for wheezing or shortness of breath.  . ALPRAZolam (XANAX) 0.5 MG tablet TAKE 1/2 TO 1 (ONE-HALF TO ONE) TABLET BY MOUTH TWICE DAILY AS NEEDED FOR ANXIETY OR  SLEEP  . aspirin 81 MG tablet Take 162 mg by mouth daily.   Marland Kitchen atenolol  (TENORMIN) 25 MG tablet Take 1 tablet daily for BP  . Calcium Carbonate-Vitamin D (CALCIUM-VITAMIN D) 500-200 MG-UNIT per tablet Take 2 tablets by mouth daily.   . cholecalciferol (VITAMIN D) 1000 units tablet Take 5,000 Units by mouth daily.  . diclofenac sodium (VOLTAREN) 1 % GEL Apply 4 g topically 4 (four) times daily.  Marland Kitchen ezetimibe (ZETIA) 10 MG tablet TAKE 1 TABLET BY MOUTH ONCE DAILY FOR  CHOLESTEROL  . vitamin C (ASCORBIC ACID) 500 MG tablet Take 1,000 mg by mouth daily.    No current facility-administered medications on file prior to visit.    Allergies  Allergen Reactions  . Dilaudid [Hydromorphone Hcl] Shortness Of Breath and Swelling  . Fentanyl Shortness Of Breath and Swelling  . Penicillins Anaphylaxis  . Crestor [Rosuvastatin] Other (See Comments)    Joint pain, cramping    Past Medical History:  Diagnosis Date  . Carotid bruit    Bilateral  . Colon polyps    with divertirticulitis  . DJD (degenerative joint disease)   . Hyperlipidemia   . Hypertension   . Osteopenia   . Prediabetes   . Vitamin D deficiency    Health Maintenance  Topic Date Due  . INFLUENZA VACCINE  12/01/2017  . MAMMOGRAM  06/23/2018 (Originally 12/21/2015)  . COLONOSCOPY  03/03/2020  . TETANUS/TDAP  11/09/2023  . DEXA SCAN  Completed  . PNA vac Low Risk Adult  Completed   Immunization History  Administered Date(s) Administered  .  Influenza-Unspecified 01/31/2014, 01/02/2015  . Pneumococcal Conjugate-13 12/01/2016  . Pneumococcal Polysaccharide-23 08/21/2012  . Td 05/04/2003  . Tdap 11/08/2013   Last Colon - 03/03/2010 _ Dr Collene Mares - recc 10 yr f/u due Nov 2021  Last MGM - 12/2013 and both MGM and dexaBMD are ordered  Past Surgical History:  Procedure Laterality Date  . CATARACT EXTRACTION Right 03/17/2017  . HAND SURGERY    . head injury  1995  . LAMINECTOMY  2002   L 5   . NECK SURGERY  2012    c4 -5/6  fushion  . TRIGGER FINGER RELEASE     Family History  Problem Relation  Age of Onset  . Hyperlipidemia Mother   . Heart disease Mother        7 way bypass/ value replacement/pacemaker  . Heart attack Mother 68  . Diabetes Mother   . Cancer - Other Mother        lung  . Hypertension Father   . Heart attack Father 80  . Cancer - Other Father        throat   Social History   Tobacco Use  . Smoking status: Current Every Day Smoker  . Smokeless tobacco: Never Used  . Tobacco comment: smokes 2 cigarettes per day  Substance Use Topics  . Alcohol use: No  . Drug use: Not on file    ROS Constitutional: Denies fever, chills, weight loss/gain, headaches, insomnia,  night sweats, and change in appetite. Does c/o fatigue. Eyes: Denies redness, blurred vision, diplopia, discharge, itchy, watery eyes.  ENT: Denies discharge, congestion, post nasal drip, epistaxis, sore throat, earache, hearing loss, dental pain, Tinnitus, Vertigo, Sinus pain, snoring.  Cardio: Denies chest pain, palpitations, irregular heartbeat, syncope, dyspnea, diaphoresis, orthopnea, PND, claudication, edema Respiratory: denies cough, dyspnea, DOE, pleurisy, hoarseness, laryngitis, wheezing.  Gastrointestinal: Denies dysphagia, heartburn, reflux, water brash, pain, cramps, nausea, vomiting, bloating, diarrhea, constipation, hematemesis, melena, hematochezia, jaundice, hemorrhoids Genitourinary: Denies dysuria, frequency, urgency, nocturia, hesitancy, discharge, hematuria, flank pain Breast: Breast lumps, nipple discharge, bleeding.  Musculoskeletal: Denies arthralgia, myalgia, stiffness, Jt. Swelling, pain, limp, and strain/sprain. Denies falls. Skin: Denies puritis, rash, hives, warts, acne, eczema, changing in skin lesion Neuro: No weakness, tremor, incoordination, spasms, paresthesia, pain Psychiatric: Denies confusion, memory loss, sensory loss. Denies Depression. Endocrine: Denies change in weight, skin, hair change, nocturia, and paresthesia, diabetic polys, visual blurring, hyper / hypo  glycemic episodes.  Heme/Lymph: No excessive bleeding, bruising, enlarged lymph nodes.  Physical Exam  BP 114/62   Pulse 60   Temp (!) 97.2 F (36.2 C)   Resp 16   Ht 5\' 2"  (1.575 m)   Wt 113 lb 6.4 oz (51.4 kg)   BMI 20.74 kg/m   General Appearance: Well nourished, well groomed and in no apparent distress.  Eyes: PERRLA, EOMs, conjunctiva no swelling or erythema, normal fundi and vessels. Sinuses: No frontal/maxillary tenderness ENT/Mouth: EACs patent / TMs  nl. Nares clear without erythema, swelling, mucoid exudates. Oral hygiene is good. No erythema, swelling, or exudate. Tongue normal, non-obstructing. Tonsils not swollen or erythematous. Hearing normal.  Neck: Supple, thyroid not palpable. No bruits, nodes or JVD. Respiratory: Respiratory effort normal.  BS equal and clear bilateral without rales, rhonci, wheezing or stridor. Cardio: Heart sounds are normal with regular rate and rhythm and no murmurs, rubs or gallops. Peripheral pulses are normal and equal bilaterally without edema. No aortic or femoral bruits. Chest: symmetric with normal excursions and percussion. Breasts: Symmetric, without lumps, nipple discharge, retractions,  or fibrocystic changes.  Abdomen: Flat, soft with bowel sounds active. Nontender, no guarding, rebound, hernias, masses, or organomegaly.  Lymphatics: Non tender without lymphadenopathy.  Musculoskeletal: Full ROM all peripheral extremities, joint stability, 5/5 strength, and normal gait. Skin: Warm and dry without rashes, lesions, cyanosis, clubbing or  ecchymosis.  Neuro: Cranial nerves intact, reflexes equal bilaterally. Normal muscle tone, no cerebellar symptoms. Sensation intact.  Pysch: Alert and oriented X 3, normal affect, Insight and Judgment appropriate.   Assessment and Plan  1. Annual Preventative Screening Examination  2. Essential hypertension  - EKG 12-Lead - Urinalysis, Routine w reflex microscopic - Microalbumin / creatinine  urine ratio - CBC with Differential/Platelet - COMPLETE METABOLIC PANEL WITH GFR - Magnesium - TSH  3. Hyperlipidemia, mixed  - EKG 12-Lead - Lipid panel - TSH  4. Abnormal glucose  - EKG 12-Lead - Hemoglobin A1c - Insulin, random  5. Vitamin D deficiency  - VITAMIN D 25 Hydroxyl  6. Prediabetes  - EKG 12-Lead - Hemoglobin A1c - Insulin, random  7. Atherosclerosis of aorta (HCC)  - EKG 12-Lead - Lipid panel  8. Screening for colorectal cancer  - POC Hemoccult Bld/Stl  9. Screening for ischemic heart disease  - EKG 12-Lead - Lipid panel  10. Family history of ischemic heart disease  - EKG 12-Lead  11. Smoker  - EKG 12-Lead  12. Medication management  - Urinalysis, Routine w reflex microscopic - Microalbumin / creatinine urine ratio - CBC with Differential/Platelet - COMPLETE METABOLIC PANEL WITH GFR - Magnesium - Lipid panel - TSH - Hemoglobin A1c - Insulin, random - VITAMIN D 25 Hydroxyl        Patient was counseled in prudent diet to achieve/maintain BMI less than 25 for weight control, BP monitoring, regular exercise and medications. Discussed med's effects and SE's. Screening labs and tests as requested with regular follow-up as recommended. Over 40 minutes of exam, counseling, chart review and high complex critical decision making was performed.

## 2018-02-13 NOTE — Patient Instructions (Signed)

## 2018-02-14 ENCOUNTER — Ambulatory Visit (INDEPENDENT_AMBULATORY_CARE_PROVIDER_SITE_OTHER): Payer: PPO | Admitting: Internal Medicine

## 2018-02-14 VITALS — BP 114/62 | HR 60 | Temp 97.2°F | Resp 16 | Ht 62.0 in | Wt 113.4 lb

## 2018-02-14 DIAGNOSIS — Z0000 Encounter for general adult medical examination without abnormal findings: Secondary | ICD-10-CM

## 2018-02-14 DIAGNOSIS — Z136 Encounter for screening for cardiovascular disorders: Secondary | ICD-10-CM | POA: Diagnosis not present

## 2018-02-14 DIAGNOSIS — Z79899 Other long term (current) drug therapy: Secondary | ICD-10-CM

## 2018-02-14 DIAGNOSIS — Z1212 Encounter for screening for malignant neoplasm of rectum: Secondary | ICD-10-CM

## 2018-02-14 DIAGNOSIS — I10 Essential (primary) hypertension: Secondary | ICD-10-CM

## 2018-02-14 DIAGNOSIS — E782 Mixed hyperlipidemia: Secondary | ICD-10-CM

## 2018-02-14 DIAGNOSIS — Z1211 Encounter for screening for malignant neoplasm of colon: Secondary | ICD-10-CM

## 2018-02-14 DIAGNOSIS — R7303 Prediabetes: Secondary | ICD-10-CM

## 2018-02-14 DIAGNOSIS — F17200 Nicotine dependence, unspecified, uncomplicated: Secondary | ICD-10-CM

## 2018-02-14 DIAGNOSIS — E559 Vitamin D deficiency, unspecified: Secondary | ICD-10-CM

## 2018-02-14 DIAGNOSIS — Z0001 Encounter for general adult medical examination with abnormal findings: Secondary | ICD-10-CM

## 2018-02-14 DIAGNOSIS — Z1239 Encounter for other screening for malignant neoplasm of breast: Secondary | ICD-10-CM

## 2018-02-14 DIAGNOSIS — E2839 Other primary ovarian failure: Secondary | ICD-10-CM

## 2018-02-14 DIAGNOSIS — R7309 Other abnormal glucose: Secondary | ICD-10-CM | POA: Diagnosis not present

## 2018-02-14 DIAGNOSIS — Z8249 Family history of ischemic heart disease and other diseases of the circulatory system: Secondary | ICD-10-CM

## 2018-02-14 DIAGNOSIS — I700 Atherosclerosis of aorta: Secondary | ICD-10-CM

## 2018-02-15 LAB — URINALYSIS, ROUTINE W REFLEX MICROSCOPIC
Bacteria, UA: NONE SEEN /HPF
Bilirubin Urine: NEGATIVE
GLUCOSE, UA: NEGATIVE
HYALINE CAST: NONE SEEN /LPF
Hgb urine dipstick: NEGATIVE
Ketones, ur: NEGATIVE
Nitrite: NEGATIVE
PH: 5.5 (ref 5.0–8.0)
Protein, ur: NEGATIVE
RBC / HPF: NONE SEEN /HPF (ref 0–2)
SPECIFIC GRAVITY, URINE: 1.008 (ref 1.001–1.03)
Squamous Epithelial / LPF: NONE SEEN /HPF (ref <=5)

## 2018-02-15 LAB — VITAMIN D 25 HYDROXY (VIT D DEFICIENCY, FRACTURES): Vit D, 25-Hydroxy: 59 ng/mL (ref 30–100)

## 2018-02-15 LAB — MICROALBUMIN / CREATININE URINE RATIO
Creatinine, Urine: 29 mg/dL (ref 20–275)
MICROALB/CREAT RATIO: 28 ug/mg{creat} (ref <30)
Microalb, Ur: 0.8 mg/dL

## 2018-02-15 LAB — HEMOGLOBIN A1C
HEMOGLOBIN A1C: 5.7 %{Hb} — AB (ref <5.7)
MEAN PLASMA GLUCOSE: 117 (calc)
eAG (mmol/L): 6.5 (calc)

## 2018-02-15 LAB — CBC WITH DIFFERENTIAL/PLATELET
Basophils Absolute: 48 cells/uL (ref 0–200)
Basophils Relative: 0.6 %
Eosinophils Absolute: 160 cells/uL (ref 15–500)
Eosinophils Relative: 2 %
HCT: 44.6 % (ref 35.0–45.0)
HEMOGLOBIN: 15.1 g/dL (ref 11.7–15.5)
LYMPHS ABS: 3136 {cells}/uL (ref 850–3900)
MCH: 33.2 pg — ABNORMAL HIGH (ref 27.0–33.0)
MCHC: 33.9 g/dL (ref 32.0–36.0)
MCV: 98 fL (ref 80.0–100.0)
MPV: 10.3 fL (ref 7.5–12.5)
Monocytes Relative: 4.2 %
NEUTROS ABS: 4320 {cells}/uL (ref 1500–7800)
Neutrophils Relative %: 54 %
Platelets: 254 10*3/uL (ref 140–400)
RBC: 4.55 10*6/uL (ref 3.80–5.10)
RDW: 12.5 % (ref 11.0–15.0)
Total Lymphocyte: 39.2 %
WBC mixed population: 336 cells/uL (ref 200–950)
WBC: 8 10*3/uL (ref 3.8–10.8)

## 2018-02-15 LAB — COMPLETE METABOLIC PANEL WITH GFR
AG Ratio: 1.5 (calc) (ref 1.0–2.5)
ALBUMIN MSPROF: 4.5 g/dL (ref 3.6–5.1)
ALT: 8 U/L (ref 6–29)
AST: 17 U/L (ref 10–35)
Alkaline phosphatase (APISO): 97 U/L (ref 33–130)
BUN: 8 mg/dL (ref 7–25)
CALCIUM: 10.3 mg/dL (ref 8.6–10.4)
CO2: 28 mmol/L (ref 20–32)
Chloride: 104 mmol/L (ref 98–110)
Creat: 0.81 mg/dL (ref 0.60–0.93)
GFR, EST AFRICAN AMERICAN: 84 mL/min/{1.73_m2} (ref >=60)
GFR, Est Non African American: 72 mL/min/{1.73_m2} (ref >=60)
GLUCOSE: 78 mg/dL (ref 65–99)
Globulin: 3.1 g/dL (calc) (ref 1.9–3.7)
Potassium: 4.8 mmol/L (ref 3.5–5.3)
SODIUM: 142 mmol/L (ref 135–146)
Total Bilirubin: 0.4 mg/dL (ref 0.2–1.2)
Total Protein: 7.6 g/dL (ref 6.1–8.1)

## 2018-02-15 LAB — LIPID PANEL
CHOL/HDL RATIO: 3.4 (calc) (ref <5.0)
Cholesterol: 246 mg/dL — ABNORMAL HIGH (ref <200)
HDL: 73 mg/dL (ref >50)
LDL Cholesterol (Calc): 136 mg/dL (calc) — ABNORMAL HIGH
NON-HDL CHOLESTEROL (CALC): 173 mg/dL — AB (ref <130)
Triglycerides: 231 mg/dL — ABNORMAL HIGH (ref <150)

## 2018-02-15 LAB — INSULIN, RANDOM: Insulin: 1.9 u[IU]/mL — ABNORMAL LOW (ref 2.0–19.6)

## 2018-02-15 LAB — MAGNESIUM: Magnesium: 2.1 mg/dL (ref 1.5–2.5)

## 2018-02-15 LAB — TSH: TSH: 1.29 mIU/L (ref 0.40–4.50)

## 2018-03-13 ENCOUNTER — Other Ambulatory Visit: Payer: Self-pay | Admitting: Physician Assistant

## 2018-03-13 DIAGNOSIS — I10 Essential (primary) hypertension: Secondary | ICD-10-CM

## 2018-03-14 DIAGNOSIS — H524 Presbyopia: Secondary | ICD-10-CM | POA: Diagnosis not present

## 2018-04-07 ENCOUNTER — Other Ambulatory Visit: Payer: Self-pay | Admitting: Physician Assistant

## 2018-04-07 DIAGNOSIS — E782 Mixed hyperlipidemia: Secondary | ICD-10-CM

## 2018-05-04 IMAGING — CT CT ABD-PELV W/ CM
2 of 5 series · 15 of 46 positions shown, 17 images · IV contrast (ISOVUE)
Comparison: None.

CLINICAL DATA: Left lower quadrant pain for 4 days.

EXAM:
CT ABDOMEN AND PELVIS WITH CONTRAST
TECHNIQUE: Multidetector CT imaging of the abdomen and pelvis was performed
using the standard protocol following bolus administration of
intravenous contrast.
CONTRAST:  80 cc Rsovue-TII IV

[Series 2: axial st · axial · 0.63mm/px · z∈[+1234,+1604]mm · 12 of 84 slices shown, 14 images]
[im 5/84  soft-tissue]
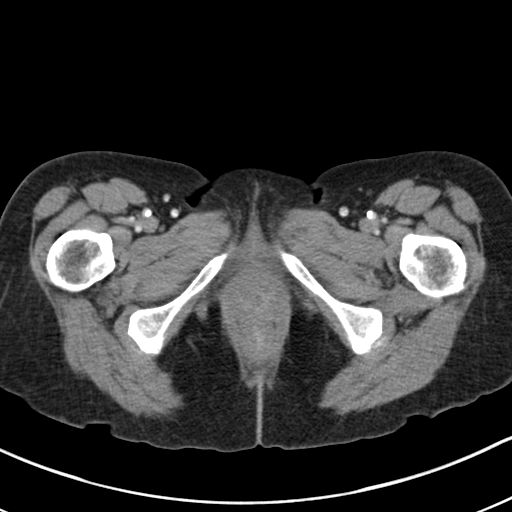
[im 5/84  bone]
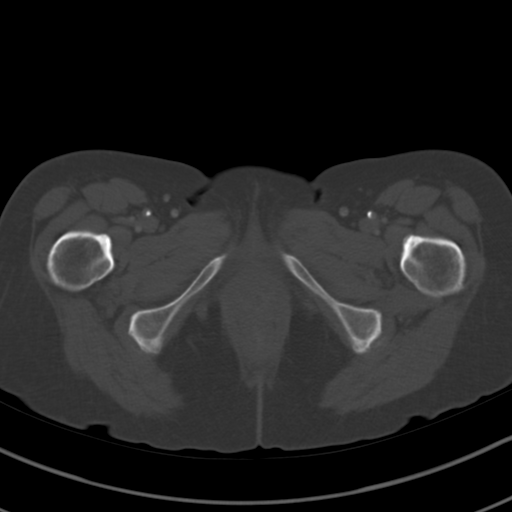
[im 15/84  soft-tissue]
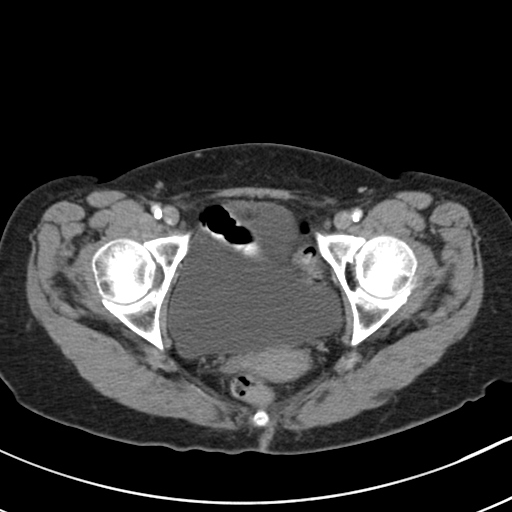
[im 20/84  soft-tissue]
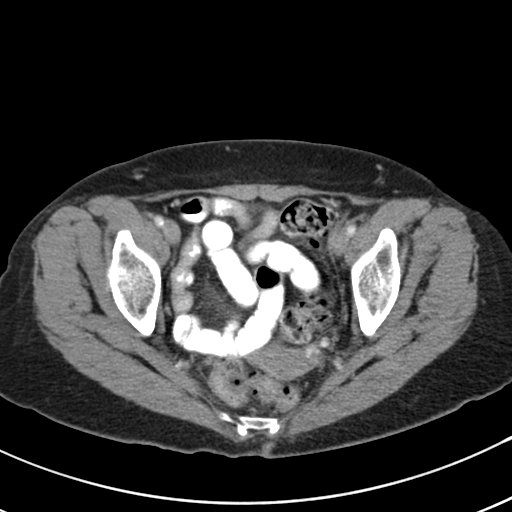
[im 25/84  soft-tissue]
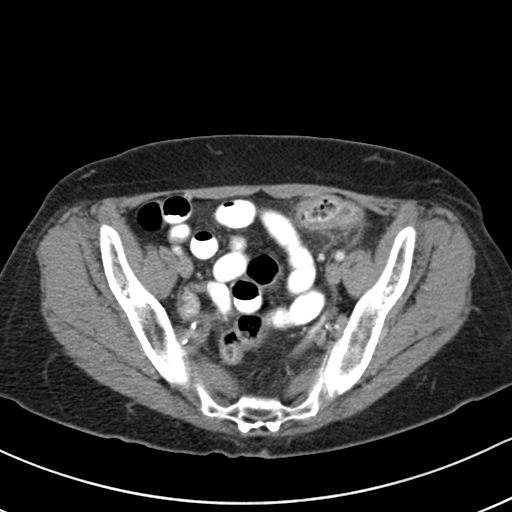
[im 35/84  soft-tissue]
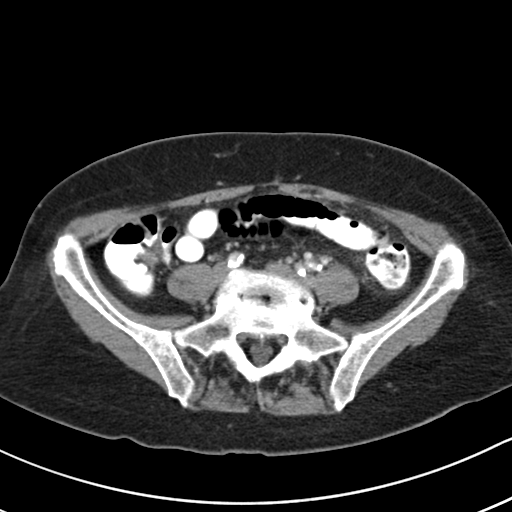
[im 40/84  soft-tissue]
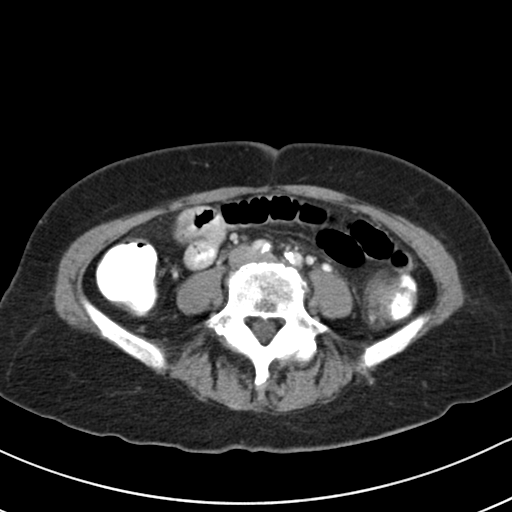
[im 44/84  soft-tissue]
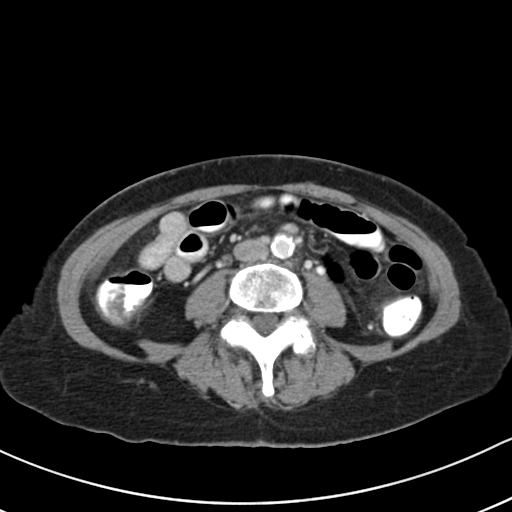
[im 54/84  soft-tissue]
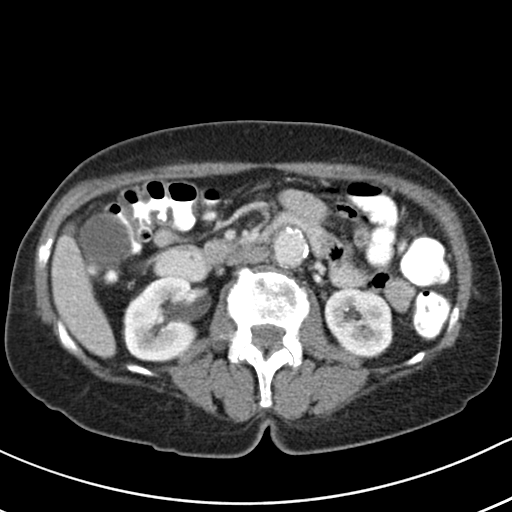
[im 59/84  soft-tissue]
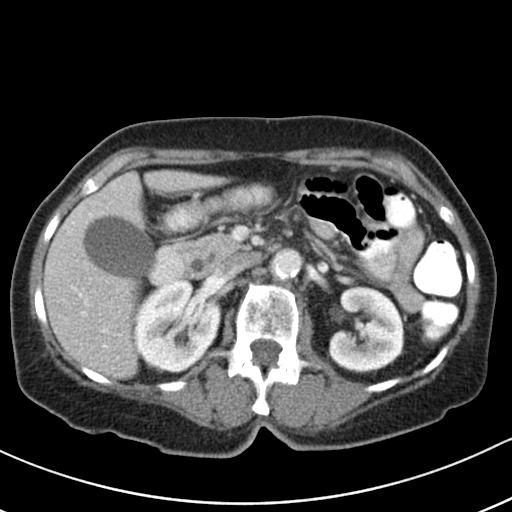
[im 59/84  bone]
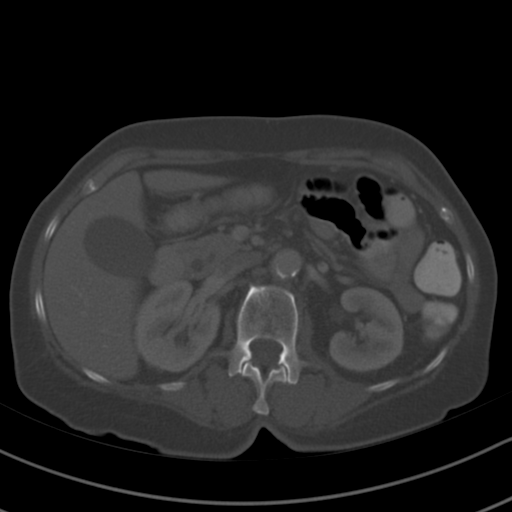
[im 64/84  soft-tissue]
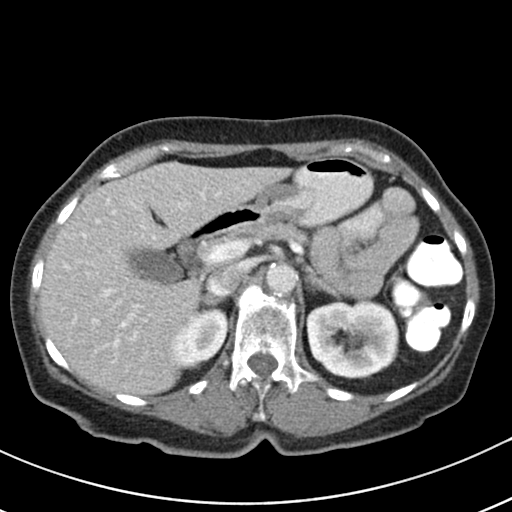
[im 74/84  soft-tissue]
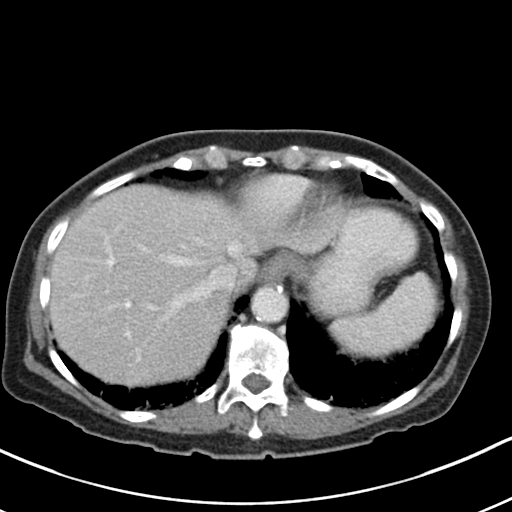
[im 79/84  soft-tissue]
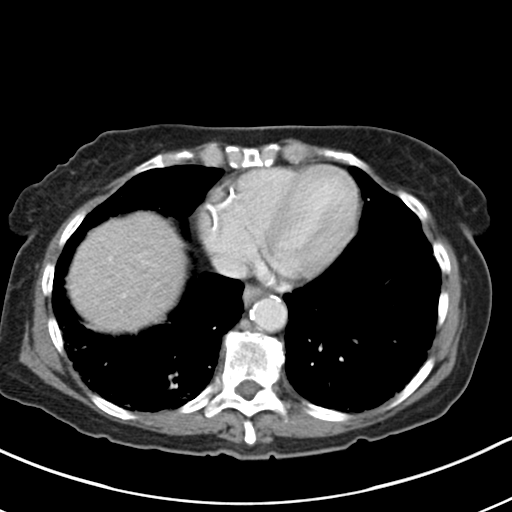

[Series 5: coronal st · coronal · 0.61mm/px · 3 of 70 slices shown]
[im 24/70  soft-tissue]
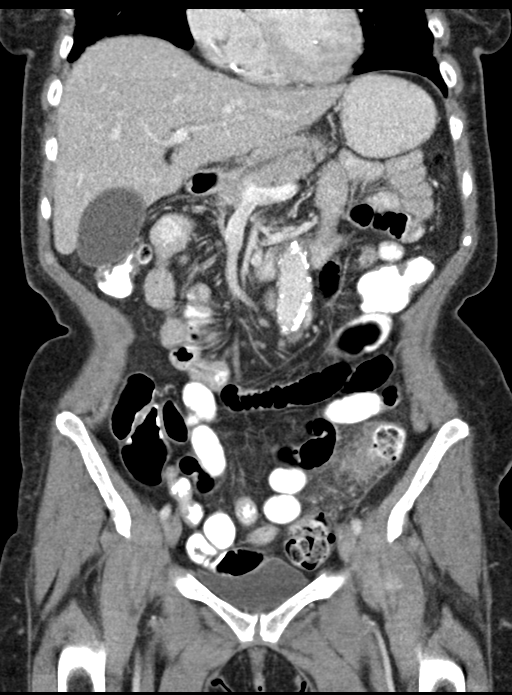
[im 31/70  soft-tissue]
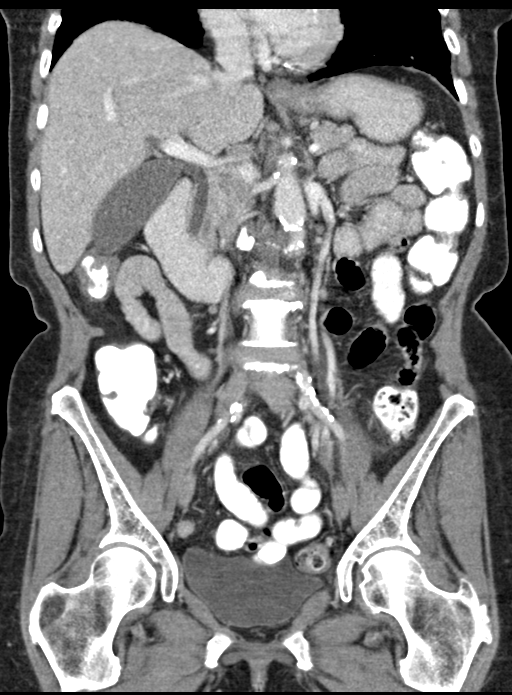
[im 39/70  soft-tissue]
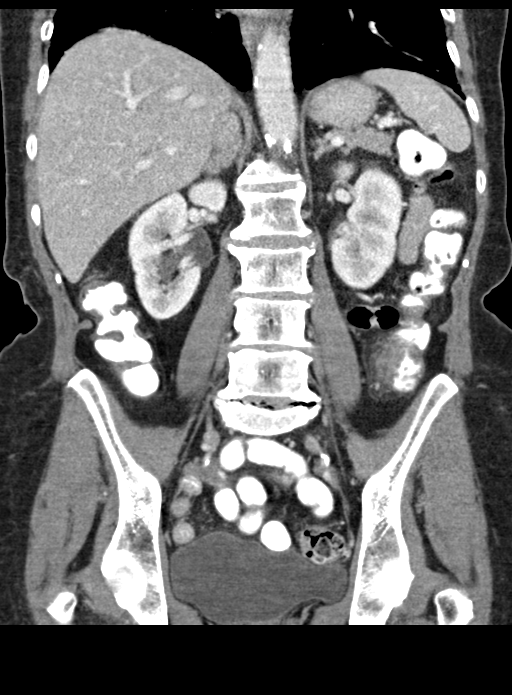

[15 of 46 positions shown; findings below may reference images not displayed]

FINDINGS: Lower chest: Subpleural reticulation in architectural distortion of
the lung bases. No confluent consolidation. No pleural fluid. There
are coronary artery calcifications.

Hepatobiliary: No focal hepatic lesion. No calcified gallstone or
pericholecystic inflammation. Common bile duct is prominent
measuring 8 mm.

Pancreas: Prominent proximal pancreatic duct measuring 3 mm. No
peripancreatic inflammation.

Spleen: Normal in size without focal abnormality.

Adrenals/Urinary Tract: Normal adrenal glands. No hydronephrosis or
perinephric edema. Homogeneous renal enhancement with symmetric
excretion on delayed phase imaging. Urinary bladder is
physiologically distended without wall thickening.

Stomach/Bowel: Inflamed diverticulum in the proximal sigmoid colon
with colonic wall thickening and pericolonic inflammation (image 54
series 2). Separate area of colonic wall thickening with probable
diverticulum and pericolonic inflammation of the mid descending
colon (image 43 series 2). No perforation or abscess. Multiple
additional noninflamed diverticular descending and sigmoid colon. No
bowel obstruction, enteric contrast reaches the descending colon. No
small bowel dilatation or inflammation. No bowel obstruction.
Appendix not confidently visualized, no evidence appendicitis.

Vascular/Lymphatic: Moderate aortic atherosclerosis. No aneurysm.
Moderate bi-iliac atherosclerosis. No enlarged abdominal or pelvic
lymph nodes.

Reproductive: Atrophic uterus.  No adnexal mass.

Other: No free air or ascites.  No intra-abdominal abscess.

Musculoskeletal: There are no acute or suspicious osseous
abnormalities.
IMPRESSION: 1. Two separate areas of acute diverticulitis involving the
mid-distal descending and proximal sigmoid colon. No perforation or
abscess either site. Consider follow-up colonoscopy after course of
treatment to ensure resolution and exclude underlying colonic
malignancy.
2. Moderate Aortic Atherosclerosis ([id number]). Coronary artery
calcifications.
3. Upper normal common bile and pancreatic ducts without evidence of
obstructing mass, likely spurious.
4. Subpleural reticulation at the lung bases suspicious for
interstitial lung disease. This could be further assessed with
nonemergent high-resolution chest CT.

## 2018-05-11 ENCOUNTER — Ambulatory Visit: Payer: PPO

## 2018-05-11 ENCOUNTER — Other Ambulatory Visit: Payer: PPO

## 2018-05-19 DIAGNOSIS — G4700 Insomnia, unspecified: Secondary | ICD-10-CM | POA: Insufficient documentation

## 2018-05-19 NOTE — Progress Notes (Deleted)
MEDICARE ANNUAL WELLNESS VISIT AND FOLLOW UP  Assessment:   Encounter for Medicare annual wellness exam 1 year follow up Declines MGM Colonoscopy- patient declines a colonoscopy even though the risks and benefits were discussed at length. Colon cancer is 3rd most diagnosed cancer and 2nd leading cause of death in both men and women 74 years of age and older. Patient understands the risk of cancer and death with declining the test.  Chronic obstructive pulmonary disease, unspecified COPD type ([place]) -     albuterol (PROAIR HFA) 108 (90 Base) MCG/ACT inhaler; Inhale 2 puffs into the lungs every 6 (six) hours as needed for wheezing or shortness of breath. - not ready to quit smoking at this time - declines daily inhaler but will do albuterol - when to call the office was discussed, understands progression of disease  Interstitial lung disease ([place]) -     albuterol (PROAIR HFA) 108 (90 Base) MCG/ACT inhaler; Inhale 2 puffs into the lungs every 6 (six) hours as needed for wheezing or shortness of breath. - not ready to quit smoking at this time - declines daily inhaler but will do albuterol - when to call the office was discussed, understands progression of disease  Hyperlipidemia, mixed -continue medications, check lipids, decrease fatty foods, increase activity.  Declines statins -     ezetimibe (ZETIA) 10 MG tablet; Take 1 tablet daily for Cholesterol -     Lipid panel  Atherosclerosis of aorta (HCC) Control blood pressure, cholesterol, glucose, increase exercise.   History of diverticulitis Increase fiber  Essential hypertension - continue medications, DASH diet, exercise and monitor at home. Call if greater than 130/80.  -     CBC with Differential/Platelet -     BASIC METABOLIC PANEL WITH GFR -     Hepatic function panel -     TSH  Hyperlipidemia, unspecified hyperlipidemia type -continue medications, check lipids, decrease fatty foods, increase activity.  DECLINES STATIN  DUE TO MUSCLE ACHES, UNDERSTANDS RISK OF MI/STROKE -     Lipid panel  Medication management -     Magnesium  Vitamin D deficiency -     VITAMIN D 25 Hydroxy (Vit-D Deficiency, Fractures)  Primary osteoarthritis involving multiple joints MONITOR -     diclofenac sodium (VOLTAREN) 1 % GEL; Apply 4 g topically 4 (four) times daily. - if not better can refer ortho  Body mass index (BMI) of 20 in adult Add ensure/boost  Poor compliance with medication Continue meds  Tobacco use Smoking cessation-  instruction/counseling given, counseled patient on the dangers of tobacco use, advised patient to stop smoking, and reviewed strategies to maximize success, patient not ready to quit at this time.  -     DG Chest 2 View; Future  Smokers' cough ([place]) Get CXR ***  Insomnia - good sleep hygiene discussed, increase day time activity, try melatonin or benadryl  Use xanax only PRN, limit to <5 days per week ***   Over 30 minutes of exam, counseling, chart review and critical decision making was performed Future Appointments  Date Time Provider [place]  05/22/2018  2:30 PM Liane Comber, NP [facility] None  07/04/2018 10:00 AM [facility] MM 2 [facility]  07/04/2018 10:30 AM [facility] DX DEXA 1 [facility]  08/24/2018  2:30 PM [name], MD [facility] None  03/08/2019  2:00 PM [name], MD [facility] None     Subjective:  Stacy [name] is a 73 y.o. female who presents for [employer] Annual Wellness  Visit and 3 MONTH FOLLOW UP.    She will take xanax PRN for sleep.  She had first diverticulitis episode in Dec.  She is DNR.   BMI is There is no height or weight on file to calculate BMI., she {HAS HAS DTO:67124} been working on diet and exercise. Wt Readings from Last 3 Encounters:  02/14/18 113 lb 6.4 oz (51.4 kg)  01/20/18 112 lb (50.8 kg)  06/23/17 118 lb 9.6 oz (53.8 kg)   She has had elevated blood pressure for several years controlled with  diet and exercise. Her blood pressure has been controlled at home, today their BP is   [place]. She denies chest pain, shortness of breath, dizziness.   She works out in the yard and also does some walking everyday.   She is on cholesterol medication  Her cholesterol is not at goal. The cholesterol last visit was:   On zetia, can not tolerate statins.  Lab Results  Component Value Date   CHOL 246 (H) 02/14/2018   HDL 73 02/14/2018   LDLCALC 136 (H) 02/14/2018   TRIG 231 (H) 02/14/2018   CHOLHDL 3.4 02/14/2018  She is on zetia, unable to tolerate statins.   She does watch her diet as she has asymptomatic prediabetes.   Lab Results  Component Value Date   HGBA1C 5.7 (H) 02/14/2018   Last GFR: Lab Results  Component Value Date   GFRNONAA 72 02/14/2018   Patient is on Vitamin D supplement, on 10,000  a day   Lab Results  Component Value Date   VD25OH 59 02/14/2018       Medication Review: Current Outpatient Medications on File Prior to Visit  Medication Sig  . albuterol (PROAIR HFA) 108 (90 Base) MCG/ACT inhaler Inhale 2 puffs into the lungs every 6 (six) hours as needed for wheezing or shortness of breath.  . ALPRAZolam (XANAX) 0.5 MG tablet TAKE 1/2 TO 1 (ONE-HALF TO ONE) TABLET BY MOUTH TWICE DAILY AS NEEDED FOR ANXIETY OR  SLEEP  . aspirin 81 MG tablet Take 162 mg by mouth daily.   Marland Kitchen atenolol (TENORMIN) 25 MG tablet TAKE 1 TABLET BY MOUTH ONCE DAILY FOR BLOOD PRESSURE  . Calcium Carbonate-Vitamin D (CALCIUM-VITAMIN D) 500-200 MG-UNIT per tablet Take 2 tablets by mouth daily.   . cholecalciferol (VITAMIN D) 1000 units tablet Take 5,000 Units by mouth daily.  . diclofenac sodium (VOLTAREN) 1 % GEL Apply 4 g topically 4 (four) times daily.  Marland Kitchen ezetimibe (ZETIA) 10 MG tablet TAKE 1 TABLET BY MOUTH ONCE DAILY FOR CHOLESTEROL  . vitamin C (ASCORBIC ACID) 500 MG tablet Take 1,000 mg by mouth daily.    No current facility-administered medications on file prior  to visit.      Allergies  Allergen Reactions  . Dilaudid [Hydromorphone Hcl] Shortness Of Breath and Swelling  . Fentanyl Shortness Of Breath and Swelling  . Penicillins Anaphylaxis  . Crestor [Rosuvastatin] Other (See Comments)    Joint pain, cramping     Current Problems (verified) Patient Active Problem List   Diagnosis Date Noted  . Smokers' cough ([place]) 06/23/2017  . Atherosclerosis of aorta ([place]) 06/23/2017  . History of diverticulitis 06/23/2017  . COPD (chronic obstructive pulmonary disease) ([place]) 04/02/2017  . Interstitial lung disease ([place]) 04/02/2017  . Tobacco use 03/23/2017  . BMI 20.0-20.9, adult 02/17/2015  . Poor compliance with medication 02/17/2015  . Medication management 06/10/2014  . Hypertension   . Hyperlipidemia   .  Vitamin D deficiency   . Prediabetes   . DJD (degenerative joint disease)     Screening Tests Immunization History  Administered Date(s) Administered  . Influenza-Unspecified 01/31/2014, 01/02/2015  . Pneumococcal Conjugate-13 12/01/2016  . Pneumococcal Polysaccharide-23 08/21/2012  . Td 05/04/2003  . Tdap 11/08/2013   Preventative care: Last colonoscopy: 2011 cologuard declined has CT AB 04/2017 Last mammogram: 2015 *** Last pap smear/pelvic exam:  DEXA: 2015 osteopenia R fem T-2.4 *** CXR 2018 *** smoker CT?   Prior vaccinations: TD or Tdap: 2015  Influenza: 2016 Declines Pneumococcal: 2014 Prevnar13:  2018 Shingles/Zostavax: N/A  Names of Other Physician/Practitioners you currently use: 1. [facility] here for primary care 2. Dr. Ronnald Ramp, eye doctor, last visit 2018 3. Dr. Lynnette Caffey, dentist, last visit 2018  Patient Care Team: [name], MD as PCP - General (Internal Medicine) Etta Quill, [place] as Referring Physician (Optometry) Luretha Rued, Carney Living, DDS (Dentistry) Sypher, Herbie [place], MD (Inactive) as Consulting Physician (Orthopedic Surgery) Marylynn Pearson, MD as Consulting  Physician (Obstetrics and Gynecology) Juanita Craver, MD as Consulting Physician (Gastroenterology)  SURGICAL HISTORY She  has a past surgical history that includes Neck surgery (2012 ); Laminectomy (2002); Hand surgery; Trigger finger release; head injury (1995); and Cataract extraction (Right, 03/17/2017). FAMILY HISTORY Her family history includes Cancer - Other in her father and mother; Diabetes in her mother; Heart attack (age of onset: 4) in her father; Heart attack (age of onset: 72) in her mother; Heart disease in her mother; Hyperlipidemia in her mother; Hypertension in her father. SOCIAL HISTORY She  reports that she has been smoking. She has never used smokeless tobacco. She reports that she does not drink alcohol.   MEDICARE WELLNESS OBJECTIVES: Physical activity:   Cardiac risk factors:   Depression/mood screen:   Depression screen [facility] 2/9 02/13/2018  Decreased Interest 0  Down, Depressed, Hopeless 0  PHQ - 2 Score 0    ADLs:  In your present state of health, do you have any difficulty performing the following activities: 02/13/2018 06/23/2017  Hearing? N N  Vision? N N  Difficulty concentrating or making decisions? N N  Walking or climbing stairs? N N  Dressing or bathing? N N  Doing errands, shopping? N N  Some recent data might be hidden     Cognitive Testing  Alert? Yes  Normal Appearance?Yes  Oriented to person? Yes  Place? Yes   Time? Yes  Recall of three objects?  Yes  Can perform simple calculations? Yes  Displays appropriate judgment?Yes  Can read the correct time from a watch face?Yes  EOL planning:    Review of Systems  Constitutional: Negative for chills, fever and malaise/fatigue.  HENT: Negative for congestion, ear pain and sore throat.   Eyes: Negative.   Respiratory: Negative for cough, shortness of breath and wheezing.   Cardiovascular: Negative for chest pain, palpitations and leg swelling.  Gastrointestinal: Negative for abdominal pain,  blood in stool, constipation, diarrhea, heartburn and melena.  Genitourinary: Negative.   Skin: Negative.   Neurological: Negative for dizziness, sensory change, loss of consciousness and headaches.  Psychiatric/Behavioral: Negative for depression. The patient is not nervous/anxious and does not have insomnia.      Objective:     There were no vitals filed for this visit. There is no height or weight on file to calculate BMI.  General appearance: alert, skinny, no distress, WD/WN, female HEENT: normocephalic, sclerae anicteric, TMs pearly, nares patent, no discharge or erythema, pharynx normal Oral  cavity: MMM, no lesions Neck: supple, + right cervical/submandicular lymphadenopathy nontender, hard, mobile, no thyromegaly, no masses Heart: RRR, normal S1, S2, no murmurs Lungs: CTA bilaterally, + wheezes bilateral lower lobes without rhonchi, or rales Abdomen: +bs, soft, non tender, non distended, no masses, no hepatomegaly, no splenomegaly Musculoskeletal: nontender, no swelling, no obvious deformity Extremities: no edema, no cyanosis, no clubbing Pulses: 2+ symmetric, upper and lower extremities, normal cap refill Neurological: alert, oriented x 3, CN2-12 intact, strength normal upper extremities and lower extremities, sensation normal throughout, DTRs 2+ throughout, no cerebellar signs, gait normal Psychiatric: normal affect, behavior normal, pleasant   Medicare Attestation I have personally reviewed: The patient's medical and social history Their use of alcohol, tobacco or illicit drugs Their current medications and supplements The patient's functional ability including ADLs,fall risks, home safety risks, cognitive, and hearing and visual impairment Diet and physical activities Evidence for depression or mood disorders  The patient's weight, height, BMI, and visual acuity have been recorded in the chart.  I have made referrals, counseling, and provided education to the patient  based on review of the above and I have provided the patient with a written personalized care plan for preventive services.     Stacy Ribas, NP   05/19/2018

## 2018-05-22 ENCOUNTER — Ambulatory Visit: Payer: Self-pay | Admitting: Adult Health

## 2018-06-07 NOTE — Progress Notes (Deleted)
MEDICARE ANNUAL WELLNESS VISIT AND FOLLOW UP  Assessment:   Encounter for Medicare annual wellness exam 1 year follow up Declines MGM Colonoscopy- patient declines a colonoscopy even though the risks and benefits were discussed at length. Colon cancer is 3rd most diagnosed cancer and 2nd leading cause of death in both men and women 74 years of age and older. Patient understands the risk of cancer and death with declining the test.  Chronic obstructive pulmonary disease, unspecified COPD type ([place]) -     albuterol (PROAIR HFA) 108 (90 Base) MCG/ACT inhaler; Inhale 2 puffs into the lungs every 6 (six) hours as needed for wheezing or shortness of breath. - not ready to quit smoking at this time - declines daily inhaler but will do albuterol - when to call the office was discussed, understands progression of disease  Interstitial lung disease ([place]) -     albuterol (PROAIR HFA) 108 (90 Base) MCG/ACT inhaler; Inhale 2 puffs into the lungs every 6 (six) hours as needed for wheezing or shortness of breath. - not ready to quit smoking at this time - declines daily inhaler but will do albuterol - when to call the office was discussed, understands progression of disease  Hyperlipidemia, mixed -continue medications, check lipids, decrease fatty foods, increase activity.  Declines statins -     ezetimibe (ZETIA) 10 MG tablet; Take 1 tablet daily for Cholesterol -     Lipid panel  Atherosclerosis of aorta (HCC) Control blood pressure, cholesterol, glucose, increase exercise.   History of diverticulitis Increase fiber  Essential hypertension - continue medications, DASH diet, exercise and monitor at home. Call if greater than 130/80.  -     CBC with Differential/Platelet -     BASIC METABOLIC PANEL WITH GFR -     Hepatic function panel -     TSH  Hyperlipidemia, unspecified hyperlipidemia type -continue medications, check lipids, decrease fatty foods, increase activity.  DECLINES STATIN  DUE TO MUSCLE ACHES, UNDERSTANDS RISK OF MI/STROKE -     Lipid panel  Medication management -     Magnesium  Vitamin D deficiency -     VITAMIN D 25 Hydroxy (Vit-D Deficiency, Fractures)  Primary osteoarthritis involving multiple joints MONITOR -     diclofenac sodium (VOLTAREN) 1 % GEL; Apply 4 g topically 4 (four) times daily. - if not better can refer ortho  Body mass index (BMI) of 20 in adult Add ensure/boost  Poor compliance with medication Continue meds  Tobacco use Smoking cessation-  instruction/counseling given, counseled patient on the dangers of tobacco use, advised patient to stop smoking, and reviewed strategies to maximize success, patient not ready to quit at this time.  -     DG Chest 2 View; Future  Smokers' cough ([place]) Get CXR ***  Insomnia - good sleep hygiene discussed, increase day time activity, try melatonin or benadryl  Use xanax only PRN, limit to <5 days per week ***   Over 30 minutes of exam, counseling, chart review and critical decision making was performed Future Appointments  Date Time Provider [place]  06/08/2018  2:00 PM Liane Comber, NP [facility] None  07/04/2018 10:00 AM [facility] MM 2 [facility]  07/04/2018 10:30 AM [facility] DX DEXA 1 [facility]  08/24/2018  2:30 PM [name], MD [facility] None  03/08/2019  2:00 PM [name], MD [facility] None     Subjective:  Stacy [name] is a 73 y.o. female who presents for [employer] Annual Wellness  Visit and 3 MONTH FOLLOW UP.    She will take xanax PRN for sleep.  She had first diverticulitis episode in Dec.  She is DNR.   BMI is There is no height or weight on file to calculate BMI., she {HAS HAS [id number]} been working on diet and exercise. Wt Readings from Last 3 Encounters:  02/14/18 113 lb 6.4 oz (51.4 kg)  01/20/18 112 lb (50.8 kg)  06/23/17 118 lb 9.6 oz (53.8 kg)   She has had elevated blood pressure for several years controlled with  diet and exercise. Her blood pressure has been controlled at home, today their BP is   [place]. She denies chest pain, shortness of breath, dizziness.   She works out in the yard and also does some walking everyday.   She is on cholesterol medication  Her cholesterol is not at goal. The cholesterol last visit was:   On zetia, can not tolerate statins.  Lab Results  Component Value Date   CHOL 246 (H) 02/14/2018   HDL 73 02/14/2018   LDLCALC 136 (H) 02/14/2018   TRIG 231 (H) 02/14/2018   CHOLHDL 3.4 02/14/2018  She is on zetia, unable to tolerate statins.   She does watch her diet as she has asymptomatic prediabetes.   Lab Results  Component Value Date   HGBA1C 5.7 (H) 02/14/2018   Last GFR: Lab Results  Component Value Date   GFRNONAA 72 02/14/2018   Patient is on Vitamin D supplement, on 10,000  a day   Lab Results  Component Value Date   VD25OH 59 02/14/2018       Medication Review: Current Outpatient Medications on File Prior to Visit  Medication Sig  . albuterol (PROAIR HFA) 108 (90 Base) MCG/ACT inhaler Inhale 2 puffs into the lungs every 6 (six) hours as needed for wheezing or shortness of breath.  . ALPRAZolam (XANAX) 0.5 MG tablet TAKE 1/2 TO 1 (ONE-HALF TO ONE) TABLET BY MOUTH TWICE DAILY AS NEEDED FOR ANXIETY OR  SLEEP  . aspirin 81 MG tablet Take 162 mg by mouth daily.   Marland Kitchen atenolol (TENORMIN) 25 MG tablet TAKE 1 TABLET BY MOUTH ONCE DAILY FOR BLOOD PRESSURE  . Calcium Carbonate-Vitamin D (CALCIUM-VITAMIN D) 500-200 MG-UNIT per tablet Take 2 tablets by mouth daily.   . cholecalciferol (VITAMIN D) 1000 units tablet Take 5,000 Units by mouth daily.  . diclofenac sodium (VOLTAREN) 1 % GEL Apply 4 g topically 4 (four) times daily.  Marland Kitchen ezetimibe (ZETIA) 10 MG tablet TAKE 1 TABLET BY MOUTH ONCE DAILY FOR CHOLESTEROL  . vitamin C (ASCORBIC ACID) 500 MG tablet Take 1,000 mg by mouth daily.    No current facility-administered medications on file prior  to visit.      Allergies  Allergen Reactions  . Dilaudid [Hydromorphone Hcl] Shortness Of Breath and Swelling  . Fentanyl Shortness Of Breath and Swelling  . Penicillins Anaphylaxis  . Crestor [Rosuvastatin] Other (See Comments)    Joint pain, cramping     Current Problems (verified) Patient Active Problem List   Diagnosis Date Noted  . Insomnia 05/19/2018  . Smokers' cough ([place]) 06/23/2017  . Atherosclerosis of aorta ([place]) 06/23/2017  . History of diverticulitis 06/23/2017  . COPD (chronic obstructive pulmonary disease) ([place]) 04/02/2017  . Interstitial lung disease ([place]) 04/02/2017  . Tobacco use 03/23/2017  . BMI 20.0-20.9, adult 02/17/2015  . Poor compliance with medication 02/17/2015  . Medication management 06/10/2014  . Hypertension   .  Hyperlipidemia   . Vitamin D deficiency   . Prediabetes   . DJD (degenerative joint disease)     Screening Tests Immunization History  Administered Date(s) Administered  . Influenza-Unspecified 01/31/2014, 01/02/2015  . Pneumococcal Conjugate-13 12/01/2016  . Pneumococcal Polysaccharide-23 08/21/2012  . Td 05/04/2003  . Tdap 11/08/2013   Preventative care: Last colonoscopy: 2011 cologuard declined has CT AB 04/2017 Last mammogram: 2015 *** Last pap smear/pelvic exam:  DEXA: 2015 osteopenia R fem T-2.4 *** CXR 2018 *** smoker CT?   Prior vaccinations: TD or Tdap: 2015  Influenza: 2016 Declines Pneumococcal: 2014 Prevnar13:  2018 Shingles/Zostavax: N/A  Names of Other Physician/Practitioners you currently use: 1. [facility] here for primary care 2. Dr. Ronnald Ramp, eye doctor, last visit 2018 3. Dr. Lynnette Caffey, dentist, last visit 2018  Patient Care Team: [name], MD as PCP - General (Internal Medicine) Etta Quill, [place] as Referring Physician (Optometry) Luretha Rued, Carney Living, DDS (Dentistry) Sypher, Herbie [place], MD (Inactive) as Consulting Physician (Orthopedic Surgery) Marylynn Pearson, MD as Consulting Physician (Obstetrics and Gynecology) Juanita Craver, MD as Consulting Physician (Gastroenterology)  SURGICAL HISTORY She  has a past surgical history that includes Neck surgery (2012 ); Laminectomy (2002); Hand surgery; Trigger finger release; head injury (1995); and Cataract extraction (Right, 03/17/2017). FAMILY HISTORY Her family history includes Cancer - Other in her father and mother; Diabetes in her mother; Heart attack (age of onset: 49) in her father; Heart attack (age of onset: 66) in her mother; Heart disease in her mother; Hyperlipidemia in her mother; Hypertension in her father. SOCIAL HISTORY She  reports that she has been smoking. She has never used smokeless tobacco. She reports that she does not drink alcohol.   MEDICARE WELLNESS OBJECTIVES: Physical activity:   Cardiac risk factors:   Depression/mood screen:   Depression screen [facility] 2/9 02/13/2018  Decreased Interest 0  Down, Depressed, Hopeless 0  PHQ - 2 Score 0    ADLs:  In your present state of health, do you have any difficulty performing the following activities: 02/13/2018 06/23/2017  Hearing? N N  Vision? N N  Difficulty concentrating or making decisions? N N  Walking or climbing stairs? N N  Dressing or bathing? N N  Doing errands, shopping? N N  Some recent data might be hidden     Cognitive Testing  Alert? Yes  Normal Appearance?Yes  Oriented to person? Yes  Place? Yes   Time? Yes  Recall of three objects?  Yes  Can perform simple calculations? Yes  Displays appropriate judgment?Yes  Can read the correct time from a watch face?Yes  EOL planning:    Review of Systems  Constitutional: Negative for chills, fever and malaise/fatigue.  HENT: Negative for congestion, ear pain and sore throat.   Eyes: Negative.   Respiratory: Negative for cough, shortness of breath and wheezing.   Cardiovascular: Negative for chest pain, palpitations and leg swelling.  Gastrointestinal:  Negative for abdominal pain, blood in stool, constipation, diarrhea, heartburn and melena.  Genitourinary: Negative.   Skin: Negative.   Neurological: Negative for dizziness, sensory change, loss of consciousness and headaches.  Psychiatric/Behavioral: Negative for depression. The patient is not nervous/anxious and does not have insomnia.      Objective:     There were no vitals filed for this visit. There is no height or weight on file to calculate BMI.  General appearance: alert, skinny, no distress, WD/WN, female HEENT: normocephalic, sclerae anicteric, TMs pearly, nares patent, no discharge or  erythema, pharynx normal Oral cavity: MMM, no lesions Neck: supple, + right cervical/submandicular lymphadenopathy nontender, hard, mobile, no thyromegaly, no masses Heart: RRR, normal S1, S2, no murmurs Lungs: CTA bilaterally, + wheezes bilateral lower lobes without rhonchi, or rales Abdomen: +bs, soft, non tender, non distended, no masses, no hepatomegaly, no splenomegaly Musculoskeletal: nontender, no swelling, no obvious deformity Extremities: no edema, no cyanosis, no clubbing Pulses: 2+ symmetric, upper and lower extremities, normal cap refill Neurological: alert, oriented x 3, CN2-12 intact, strength normal upper extremities and lower extremities, sensation normal throughout, DTRs 2+ throughout, no cerebellar signs, gait normal Psychiatric: normal affect, behavior normal, pleasant   Medicare Attestation I have personally reviewed: The patient's medical and social history Their use of alcohol, tobacco or illicit drugs Their current medications and supplements The patient's functional ability including ADLs,fall risks, home safety risks, cognitive, and hearing and visual impairment Diet and physical activities Evidence for depression or mood disorders  The patient's weight, height, BMI, and visual acuity have been recorded in the chart.  I have made referrals, counseling, and  provided education to the patient based on review of the above and I have provided the patient with a written personalized care plan for preventive services.     Izora Ribas, NP   06/07/2018

## 2018-06-08 ENCOUNTER — Ambulatory Visit: Payer: Self-pay | Admitting: Adult Health

## 2018-06-08 DIAGNOSIS — R69 Illness, unspecified: Secondary | ICD-10-CM | POA: Diagnosis not present

## 2018-07-04 ENCOUNTER — Ambulatory Visit
Admission: RE | Admit: 2018-07-04 | Discharge: 2018-07-04 | Disposition: A | Discharge status: Home / Self Care | Payer: Self-pay | Source: Ambulatory Visit | Attending: Internal Medicine | Admitting: Internal Medicine

## 2018-07-04 ENCOUNTER — Other Ambulatory Visit: Payer: Self-pay | Admitting: Internal Medicine

## 2018-07-04 ENCOUNTER — Ambulatory Visit
Admission: RE | Admit: 2018-07-04 | Discharge: 2018-07-04 | Disposition: A | Discharge status: Home / Self Care | Payer: Medicare HMO | Source: Ambulatory Visit | Attending: Internal Medicine | Admitting: Internal Medicine

## 2018-07-04 DIAGNOSIS — Z1231 Encounter for screening mammogram for malignant neoplasm of breast: Secondary | ICD-10-CM

## 2018-07-04 DIAGNOSIS — E2839 Other primary ovarian failure: Secondary | ICD-10-CM

## 2018-07-04 DIAGNOSIS — Z780 Asymptomatic menopausal state: Secondary | ICD-10-CM | POA: Diagnosis not present

## 2018-07-04 DIAGNOSIS — M810 Age-related osteoporosis without current pathological fracture: Secondary | ICD-10-CM | POA: Diagnosis not present

## 2018-07-04 MED ORDER — ALENDRONATE SODIUM 70 MG PO TABS: ORAL_TABLET | ORAL | 3 refills | Status: DC

## 2018-08-24 ENCOUNTER — Ambulatory Visit (INDEPENDENT_AMBULATORY_CARE_PROVIDER_SITE_OTHER): Payer: Medicare HMO | Admitting: Internal Medicine

## 2018-08-24 ENCOUNTER — Ambulatory Visit: Payer: Self-pay | Admitting: Internal Medicine

## 2018-08-24 ENCOUNTER — Other Ambulatory Visit: Payer: Self-pay

## 2018-08-24 VITALS — BP 134/82 | HR 72 | Temp 97.0°F | Resp 16 | Ht 62.0 in | Wt 117.0 lb

## 2018-08-24 DIAGNOSIS — Z79899 Other long term (current) drug therapy: Secondary | ICD-10-CM

## 2018-08-24 DIAGNOSIS — R7303 Prediabetes: Secondary | ICD-10-CM | POA: Diagnosis not present

## 2018-08-24 DIAGNOSIS — I10 Essential (primary) hypertension: Secondary | ICD-10-CM | POA: Diagnosis not present

## 2018-08-24 DIAGNOSIS — E559 Vitamin D deficiency, unspecified: Secondary | ICD-10-CM | POA: Diagnosis not present

## 2018-08-24 DIAGNOSIS — E782 Mixed hyperlipidemia: Secondary | ICD-10-CM | POA: Diagnosis not present

## 2018-08-24 DIAGNOSIS — R7309 Other abnormal glucose: Secondary | ICD-10-CM

## 2018-08-24 NOTE — Progress Notes (Signed)
History of Present Illness:      This very nice 73 y.o. DWF presents for 6 month follow up with HTN, HLD, Pre-Diabetes and Vitamin D Deficiency.       Patient is treated for HTN (1995) & BP has been controlled at home. Todays BP is at goal - 134/82. Patient has had no complaints of any cardiac type chest pain, palpitations, dyspnea / orthopnea / PND, dizziness, claudication, or dependent edema.      Patient refuses Statins and consequently her hyperlipidemia is not controlled with diet & Zetia. Patient denies myalgias or other med SEs. Last Lipids were not at goal: Lab Results  Component Value Date   CHOL 246 (H) 02/14/2018   HDL 73 02/14/2018   LDLCALC 136 (H) 02/14/2018   TRIG 231 (H) 02/14/2018   CHOLHDL 3.4 02/14/2018       Also, the patient has history of PreDiabetes (A1c 6.4% /2009)  and has had no symptoms of reactive hypoglycemia, diabetic polys, paresthesias or visual blurring.  Last A1c was not at goal:  Lab Results  Component Value Date   HGBA1C 5.7 (H) 02/14/2018      Further, the patient also has history of Vitamin D Deficiency ("7" / 2009)  and supplements vitamin D without any suspected side-effects. Last vitamin D was at goal: Lab Results  Component Value Date   VD25OH 59 02/14/2018    Current Outpatient Medications on File Prior to Visit  Medication Sig   albuterol (PROAIR HFA) 108 (90 Base) MCG/ACT inhaler Inhale 2 puffs into the lungs every 6 (six) hours as needed for wheezing or shortness of breath.   alendronate (FOSAMAX) 70 MG tablet Take 1 tablet every 7 days on an empty stomach with only water for 30 minutes   ALPRAZolam (XANAX) 0.5 MG tablet TAKE 1/2 TO 1 (ONE-HALF TO ONE) TABLET BY MOUTH TWICE DAILY AS NEEDED FOR ANXIETY OR  SLEEP   aspirin 81 MG tablet Take 162 mg by mouth daily.    atenolol (TENORMIN) 25 MG tablet TAKE 1 TABLET BY MOUTH ONCE DAILY FOR BLOOD PRESSURE   Calcium Carbonate-Vitamin D (CALCIUM-VITAMIN D) 500-200 MG-UNIT per tablet  Take 2 tablets by mouth daily.    cholecalciferol (VITAMIN D) 1000 units tablet Take 5,000 Units by mouth daily.   ezetimibe (ZETIA) 10 MG tablet TAKE 1 TABLET BY MOUTH ONCE DAILY FOR CHOLESTEROL   vitamin C (ASCORBIC ACID) 500 MG tablet Take 1,000 mg by mouth daily.    No current facility-administered medications on file prior to visit.    Allergies  Allergen Reactions   Dilaudid [Hydromorphone Hcl] Shortness Of Breath and Swelling   Fentanyl Shortness Of Breath and Swelling   Penicillins Anaphylaxis   Crestor [Rosuvastatin] Other (See Comments)    Joint pain, cramping    PMHx:   Past Medical History:  Diagnosis Date   Carotid bruit    Bilateral   Colon polyps    with divertirticulitis   DJD (degenerative joint disease)    Hyperlipidemia    Hypertension    Osteopenia    Prediabetes    Vitamin D deficiency    Immunization History  Administered Date(s) Administered   Influenza-Unspecified 01/31/2014, 01/02/2015   Pneumococcal Conjugate-13 12/01/2016   Pneumococcal Polysaccharide-23 08/21/2012   Td 05/04/2003   Tdap 11/08/2013   Past Surgical History:  Procedure Laterality Date   CATARACT EXTRACTION Right 03/17/2017   HAND SURGERY     head injury  1995   LAMINECTOMY  2002  L 5    NECK SURGERY  2012    c4 -5/6  fushion   TRIGGER FINGER RELEASE     FHx:    Reviewed / unchanged  SHx:    Reviewed / unchanged   Systems Review:  Constitutional: Denies fever, chills, wt changes, headaches, insomnia, fatigue, night sweats, change in appetite. Eyes: Denies redness, blurred vision, diplopia, discharge, itchy, watery eyes.  ENT: Denies discharge, congestion, post nasal drip, epistaxis, sore throat, earache, hearing loss, dental pain, tinnitus, vertigo, sinus pain, snoring.  CV: Denies chest pain, palpitations, irregular heartbeat, syncope, dyspnea, diaphoresis, orthopnea, PND, claudication or edema. Respiratory: denies cough, dyspnea, DOE,  pleurisy, hoarseness, laryngitis, wheezing.  Gastrointestinal: Denies dysphagia, odynophagia, heartburn, reflux, water brash, abdominal pain or cramps, nausea, vomiting, bloating, diarrhea, constipation, hematemesis, melena, hematochezia  or hemorrhoids. Genitourinary: Denies dysuria, frequency, urgency, nocturia, hesitancy, discharge, hematuria or flank pain. Musculoskeletal: Denies arthralgias, myalgias, stiffness, jt. swelling, pain, limping or strain/sprain.  Skin: Denies pruritus, rash, hives, warts, acne, eczema or change in skin lesion(s). Neuro: No weakness, tremor, incoordination, spasms, paresthesia or pain. Psychiatric: Denies confusion, memory loss or sensory loss. Endo: Denies change in weight, skin or hair change.  Heme/Lymph: No excessive bleeding, bruising or enlarged lymph nodes.  Physical Exam  BP 134/82    Pulse 72    Temp (!) 97 F (36.1 C)    Resp 16    Ht 5\' 2"  (1.575 m)    Wt 117 lb (53.1 kg)    BMI 21.40 kg/m   Appears  well nourished, well groomed  and in no distress.  Eyes: PERRLA, EOMs, conjunctiva no swelling or erythema. Sinuses: No frontal/maxillary tenderness ENT/Mouth: EAC's clear, TM's nl w/o erythema, bulging. Nares clear w/o erythema, swelling, exudates. Oropharynx clear without erythema or exudates. Oral hygiene is good. Tongue normal, non obstructing. Hearing intact.  Neck: Supple. Thyroid not palpable. Car 2+/2+ without bruits, nodes or JVD. Chest: Respirations nl with BS clear & equal w/o rales, rhonchi, wheezing or stridor.  Cor: Heart sounds normal w/ regular rate and rhythm without sig. murmurs, gallops, clicks or rubs. Peripheral pulses normal and equal  without edema.  Abdomen: Soft & bowel sounds normal. Non-tender w/o guarding, rebound, hernias, masses or organomegaly.  Lymphatics: Unremarkable.  Musculoskeletal: Full ROM all peripheral extremities, joint stability, 5/5 strength and normal gait.  Skin: Warm, dry without exposed rashes, lesions  or ecchymosis apparent.  Neuro: Cranial nerves intact, reflexes equal bilaterally. Sensory-motor testing grossly intact. Tendon reflexes grossly intact.  Pysch: Alert & oriented x 3.  Insight and judgement nl & appropriate. No ideations.  Assessment and Plan:  1. Essential hypertension  - Continue medication, monitor blood pressure at home.  - Continue DASH diet.  Reminder to go to the ER if any CP,  SOB, nausea, dizziness, severe HA, changes vision/speech.  - CBC with Differential/Platelet - COMPLETE METABOLIC PANEL WITH GFR - Magnesium - TSH - atenolol (TENORMIN) 25 MG tablet; Take 1 tablet Daily for BP  Dispense: 90 tablet; Refill: 3  2. Hyperlipidemia, mixed  - Continue diet/meds, exercise,& lifestyle modifications.  - Continue monitor periodic cholesterol/liver & renal functions   - Lipid panel - TSH  3. Abnormal glucose  - Continue diet, exercise  - Lifestyle modifications.  - Monitor appropriate labs.  - Hemoglobin A1c - Insulin, random  4. Vitamin D deficiency  - Continue supplementation.   - VITAMIN D 25 Hydroxyl  5. Prediabetes  - Hemoglobin A1c - Insulin, random  6. Medication management  -  CBC with Differential/Platelet - COMPLETE METABOLIC PANEL WITH GFR - Magnesium - Lipid panel - TSH - Hemoglobin A1c - Insulin, random - VITAMIN D 25 Hydroxyl      Discussed  regular exercise, BP monitoring, weight control to achieve/maintain BMI less than 25 and discussed med and SE's. Recommended labs to assess and monitor clinical status with further disposition pending results of labs. I discussed the assessment and treatment plan with the patient. The patient was provided an opportunity to ask questions and all were answered. The patient agreed with the plan and demonstrated an understanding of the instructions. Over 30 minutes of exam, counseling, chart review was performed.   Kirtland Bouchard, MD

## 2018-08-24 NOTE — Patient Instructions (Signed)

## 2018-08-25 ENCOUNTER — Encounter: Payer: Self-pay | Admitting: Internal Medicine

## 2018-08-25 LAB — CBC WITH DIFFERENTIAL/PLATELET
Absolute Monocytes: 442 cells/uL (ref 200–950)
Basophils Absolute: 53 cells/uL (ref 0–200)
Basophils Relative: 0.8 %
Eosinophils Absolute: 277 cells/uL (ref 15–500)
Eosinophils Relative: 4.2 %
HCT: 43.7 % (ref 35.0–45.0)
Hemoglobin: 14.8 g/dL (ref 11.7–15.5)
Lymphs Abs: 2594 cells/uL (ref 850–3900)
MCH: 33.5 pg — ABNORMAL HIGH (ref 27.0–33.0)
MCHC: 33.9 g/dL (ref 32.0–36.0)
MCV: 98.9 fL (ref 80.0–100.0)
MPV: 10.3 fL (ref 7.5–12.5)
Monocytes Relative: 6.7 %
Neutro Abs: 3234 cells/uL (ref 1500–7800)
Neutrophils Relative %: 49 %
Platelets: 250 10*3/uL (ref 140–400)
RBC: 4.42 10*6/uL (ref 3.80–5.10)
RDW: 12.5 % (ref 11.0–15.0)
Total Lymphocyte: 39.3 %
WBC: 6.6 10*3/uL (ref 3.8–10.8)

## 2018-08-25 LAB — COMPLETE METABOLIC PANEL WITH GFR
AG Ratio: 1.3 (calc) (ref 1.0–2.5)
ALT: 8 U/L (ref 6–29)
AST: 16 U/L (ref 10–35)
Albumin: 4.3 g/dL (ref 3.6–5.1)
Alkaline phosphatase (APISO): 84 U/L (ref 37–153)
BUN: 12 mg/dL (ref 7–25)
CO2: 27 mmol/L (ref 20–32)
Calcium: 9.7 mg/dL (ref 8.6–10.4)
Chloride: 104 mmol/L (ref 98–110)
Creat: 0.79 mg/dL (ref 0.60–0.93)
GFR, Est African American: 85 mL/min/{1.73_m2} (ref >=60)
GFR, Est Non African American: 74 mL/min/{1.73_m2} (ref >=60)
Globulin: 3.3 g/dL (calc) (ref 1.9–3.7)
Glucose, Bld: 83 mg/dL (ref 65–99)
Potassium: 4.3 mmol/L (ref 3.5–5.3)
Sodium: 137 mmol/L (ref 135–146)
Total Bilirubin: 0.3 mg/dL (ref 0.2–1.2)
Total Protein: 7.6 g/dL (ref 6.1–8.1)

## 2018-08-25 LAB — MAGNESIUM: Magnesium: 2.2 mg/dL (ref 1.5–2.5)

## 2018-08-25 LAB — HEMOGLOBIN A1C
Hgb A1c MFr Bld: 5.6 % of total Hgb (ref <5.7)
Mean Plasma Glucose: 114 (calc)
eAG (mmol/L): 6.3 (calc)

## 2018-08-25 LAB — LIPID PANEL
Cholesterol: 269 mg/dL — ABNORMAL HIGH (ref <200)
HDL: 68 mg/dL (ref >=50)
LDL Cholesterol (Calc): 163 mg/dL (calc) — ABNORMAL HIGH
Non-HDL Cholesterol (Calc): 201 mg/dL (calc) — ABNORMAL HIGH (ref <130)
Total CHOL/HDL Ratio: 4 (calc) (ref <5.0)
Triglycerides: 226 mg/dL — ABNORMAL HIGH (ref <150)

## 2018-08-25 LAB — INSULIN, RANDOM: Insulin: 4.7 u[IU]/mL

## 2018-08-25 LAB — TSH: TSH: 2.68 mIU/L (ref 0.40–4.50)

## 2018-08-25 LAB — VITAMIN D 25 HYDROXY (VIT D DEFICIENCY, FRACTURES): Vit D, 25-Hydroxy: 48 ng/mL (ref 30–100)

## 2018-08-25 MED ORDER — ATENOLOL 25 MG PO TABS: ORAL_TABLET | ORAL | 3 refills | Status: AC

## 2018-08-27 ENCOUNTER — Encounter: Payer: Self-pay | Admitting: Internal Medicine

## 2018-09-05 ENCOUNTER — Encounter (HOSPITAL_BASED_OUTPATIENT_CLINIC_OR_DEPARTMENT_OTHER): Payer: Self-pay | Admitting: Emergency Medicine

## 2018-09-05 ENCOUNTER — Emergency Department (HOSPITAL_BASED_OUTPATIENT_CLINIC_OR_DEPARTMENT_OTHER): Payer: Medicare HMO

## 2018-09-05 ENCOUNTER — Other Ambulatory Visit: Payer: Self-pay

## 2018-09-05 ENCOUNTER — Emergency Department (HOSPITAL_BASED_OUTPATIENT_CLINIC_OR_DEPARTMENT_OTHER)
Admission: EM | Admit: 2018-09-05 | Discharge: 2018-09-05 | Disposition: A | Discharge status: Home / Self Care | Payer: Medicare HMO | Attending: Emergency Medicine | Admitting: Emergency Medicine

## 2018-09-05 DIAGNOSIS — Y9389 Activity, other specified: Secondary | ICD-10-CM | POA: Insufficient documentation

## 2018-09-05 DIAGNOSIS — S82045A Nondisplaced comminuted fracture of left patella, initial encounter for closed fracture: Secondary | ICD-10-CM | POA: Diagnosis not present

## 2018-09-05 DIAGNOSIS — S0083XA Contusion of other part of head, initial encounter: Secondary | ICD-10-CM | POA: Diagnosis not present

## 2018-09-05 DIAGNOSIS — F17210 Nicotine dependence, cigarettes, uncomplicated: Secondary | ICD-10-CM | POA: Insufficient documentation

## 2018-09-05 DIAGNOSIS — J449 Chronic obstructive pulmonary disease, unspecified: Secondary | ICD-10-CM | POA: Diagnosis not present

## 2018-09-05 DIAGNOSIS — J3489 Other specified disorders of nose and nasal sinuses: Secondary | ICD-10-CM | POA: Diagnosis not present

## 2018-09-05 DIAGNOSIS — S0093XA Contusion of unspecified part of head, initial encounter: Secondary | ICD-10-CM | POA: Diagnosis not present

## 2018-09-05 DIAGNOSIS — Y998 Other external cause status: Secondary | ICD-10-CM | POA: Diagnosis not present

## 2018-09-05 DIAGNOSIS — S8992XA Unspecified injury of left lower leg, initial encounter: Secondary | ICD-10-CM | POA: Diagnosis present

## 2018-09-05 DIAGNOSIS — I2510 Atherosclerotic heart disease of native coronary artery without angina pectoris: Secondary | ICD-10-CM | POA: Diagnosis not present

## 2018-09-05 DIAGNOSIS — S0992XA Unspecified injury of nose, initial encounter: Secondary | ICD-10-CM | POA: Diagnosis not present

## 2018-09-05 DIAGNOSIS — S93401A Sprain of unspecified ligament of right ankle, initial encounter: Secondary | ICD-10-CM

## 2018-09-05 DIAGNOSIS — W0110XA Fall on same level from slipping, tripping and stumbling with subsequent striking against unspecified object, initial encounter: Secondary | ICD-10-CM | POA: Insufficient documentation

## 2018-09-05 DIAGNOSIS — I10 Essential (primary) hypertension: Secondary | ICD-10-CM | POA: Diagnosis not present

## 2018-09-05 DIAGNOSIS — Z79899 Other long term (current) drug therapy: Secondary | ICD-10-CM | POA: Diagnosis not present

## 2018-09-05 DIAGNOSIS — W19XXXA Unspecified fall, initial encounter: Secondary | ICD-10-CM

## 2018-09-05 DIAGNOSIS — S99911A Unspecified injury of right ankle, initial encounter: Secondary | ICD-10-CM | POA: Diagnosis not present

## 2018-09-05 DIAGNOSIS — S82002A Unspecified fracture of left patella, initial encounter for closed fracture: Secondary | ICD-10-CM | POA: Insufficient documentation

## 2018-09-05 DIAGNOSIS — M25462 Effusion, left knee: Secondary | ICD-10-CM | POA: Diagnosis not present

## 2018-09-05 DIAGNOSIS — Y929 Unspecified place or not applicable: Secondary | ICD-10-CM | POA: Insufficient documentation

## 2018-09-05 DIAGNOSIS — M25571 Pain in right ankle and joints of right foot: Secondary | ICD-10-CM | POA: Diagnosis not present

## 2018-09-05 DIAGNOSIS — Z7982 Long term (current) use of aspirin: Secondary | ICD-10-CM | POA: Diagnosis not present

## 2018-09-05 DIAGNOSIS — R69 Illness, unspecified: Secondary | ICD-10-CM | POA: Diagnosis not present

## 2018-09-05 NOTE — ED Triage Notes (Signed)
Pt tripped and fell over uneven pavement. C/o nasal pain and L knee pain. Denies LOC, neck or back pain.

## 2018-09-05 NOTE — ED Provider Notes (Signed)
[name] Provider Note   CSN: [id number] Arrival date & time: 09/05/18  1818    History   Chief Complaint Chief Complaint  Patient presents with   Fall    HPI Stacy [name] is a 73 y.o. female.     HPI Patient presents after mechanical fall.  Fell after stepping on some uneven pavement.  Pain in left knee right ankle and states she had face.  Was mildly dazed but no loss conscious.  Has been able to ambulate but does have pain in the knee.  No confusion no neck chest or abdominal pain.  She is not on anticoagulation. Past Medical History:  Diagnosis Date   Carotid bruit    Bilateral   Colon polyps    with divertirticulitis   DJD (degenerative joint disease)    Hyperlipidemia    Hypertension    Osteopenia    Prediabetes    Vitamin D deficiency     Patient Active Problem List   Diagnosis Date Noted   Insomnia 05/19/2018   Smokers' cough ([place]) 06/23/2017   Atherosclerosis of aorta ([place]) 06/23/2017   History of diverticulitis 06/23/2017   COPD (chronic obstructive pulmonary disease) ([place]) 04/02/2017   Interstitial lung disease ([place]) 04/02/2017   Tobacco use 03/23/2017   BMI 20.0-20.9, adult 02/17/2015   Poor compliance with medication 02/17/2015   Medication management 06/10/2014   Hypertension    Hyperlipidemia    Vitamin D deficiency    Prediabetes    DJD (degenerative joint disease)     Past Surgical History:  Procedure Laterality Date   CATARACT EXTRACTION Right 03/17/2017   HAND SURGERY     head injury  1995   LAMINECTOMY  2002   L 5    NECK SURGERY  2012    c4 -5/6  fushion   TRIGGER FINGER RELEASE       OB History   No obstetric history on file.      Home Medications    Prior to Admission medications   Medication Sig Start Date End Date Taking? Authorizing Provider  albuterol (PROAIR HFA) 108 (90 Base) MCG/ACT inhaler Inhale 2 puffs into the lungs every 6 (six) hours as  needed for wheezing or shortness of breath. 06/23/17   Vicie Mutters, PA-C  alendronate (FOSAMAX) 70 MG tablet Take 1 tablet every 7 days on an empty stomach with only water for 30 minutes 07/04/18   [name], MD  ALPRAZolam Duanne Moron) 0.5 MG tablet TAKE 1/2 TO 1 (ONE-HALF TO ONE) TABLET BY MOUTH TWICE DAILY AS NEEDED FOR ANXIETY OR  SLEEP 12/27/17   Vicie Mutters, PA-C  aspirin 81 MG tablet Take 162 mg by mouth daily.     [provider]  atenolol (TENORMIN) 25 MG tablet Take 1 tablet Daily for BP 08/25/18   [name], MD  Calcium Carbonate-Vitamin D (CALCIUM-VITAMIN D) 500-200 MG-UNIT per tablet Take 2 tablets by mouth daily.     [provider]  cholecalciferol (VITAMIN D) 1000 units tablet Take 5,000 Units by mouth daily.    [provider]  ezetimibe (ZETIA) 10 MG tablet TAKE 1 TABLET BY MOUTH ONCE DAILY FOR CHOLESTEROL 04/07/18   [name], MD  vitamin C (ASCORBIC ACID) 500 MG tablet Take 1,000 mg by mouth daily.     [provider]    Family History Family History  Problem Relation Age of Onset   Hyperlipidemia Mother    Heart disease Mother  7 way bypass/ value replacement/pacemaker   Heart attack Mother 9   Diabetes Mother    Cancer - Other Mother        lung   Hypertension Father    Heart attack Father 5   Cancer - Other Father        throat    Social History Social History   Tobacco Use   Smoking status: Current Every Day Smoker   Smokeless tobacco: Never Used   Tobacco comment: smokes 2 cigarettes per day  Substance Use Topics   Alcohol use: No   Drug use: Not on file     Allergies   Dilaudid [hydromorphone hcl]; Fentanyl; Penicillins; and Crestor [rosuvastatin]   Review of Systems Review of Systems  Constitutional: Negative for appetite change and fever.  Respiratory: Negative for shortness of breath.   Cardiovascular: Negative for chest pain.  Genitourinary: Negative for flank  pain.  Musculoskeletal: Negative for back pain and neck pain.       Left knee and right ankle pain.  Skin: Positive for wound.  Neurological: Negative for weakness and headaches.     Physical Exam Updated Vital Signs BP 137/89 (BP Location: Right Arm)    Pulse 76    Temp 98.5 F (36.9 C) (Oral)    Resp 14    Ht 5\' 2"  (1.575 m)    Wt 53.1 kg    SpO2 96%    BMI 21.40 kg/m   Physical Exam Vitals signs and nursing note reviewed.  HENT:     Head:     Comments: Tenderness over bridge of nose.  No deformity.  No septal hematoma.  Face otherwise nontender.  Eye movements intact. Eyes:     Pupils: Pupils are equal, round, and reactive to light.  Neck:     Comments: Painless cervical spine.  Normal range of motion.  No neck swelling or tenderness otherwise. Cardiovascular:     Rate and Rhythm: Regular rhythm.  Chest:     Chest wall: No tenderness.  Musculoskeletal:     Comments: Tenderness over left knee anteriorly and inferiorly.  Neurovascular intact over left foot.  No tenderness over ankle or hip.  Bruising but skin otherwise intact. Some tenderness over right ankle.  Tenderness over right posterior aspect of the lateral malleolus.  No tenderness over base of fifth metatarsal or proximal fibula.  Skin:    Capillary Refill: Capillary refill takes less than 2 seconds.  Neurological:     Mental Status: She is alert. Mental status is at baseline.      ED Treatments / Results  Labs (all labs ordered are listed, but only abnormal results are displayed) Labs Reviewed - No data to display  EKG None  Radiology Dg Nasal Bones  Result Date: 09/05/2018 CLINICAL DATA:  Status post fall, pain EXAM: NASAL BONES - 3+ VIEW COMPARISON:  None. FINDINGS: There is no evidence of fracture or other bone abnormality. IMPRESSION: Negative. Electronically Signed   By: Kathreen Devoid   On: 09/05/2018 19:22   Dg Ankle Complete Right  Result Date: 09/05/2018 CLINICAL DATA:  Fall with ankle pain EXAM:  RIGHT ANKLE - COMPLETE 3+ VIEW COMPARISON:  None. FINDINGS: No acute displaced fracture or malalignment. Ossicle or old fracture at the medial malleolus. Mortise is symmetric. Moderate plantar calcaneal spur IMPRESSION: No acute osseous abnormality. Old fracture or ossicle adjacent to the medial malleolus Electronically Signed   By: Donavan Foil M.D.   On: 09/05/2018 19:27   Dg  Knee Complete 4 Views Left  Result Date: 09/05/2018 CLINICAL DATA:  Fall with knee pain EXAM: LEFT KNEE - COMPLETE 4+ VIEW COMPARISON:  None. FINDINGS: No dislocation. Possible subtle nondisplaced medial patellar fracture on 1 of the oblique views. Small knee effusion. Mild medial joint space degenerative change. Vascular calcifications. IMPRESSION: Possible subtle nondisplaced medial patellar fracture. Small knee effusion Electronically Signed   By: Donavan Foil M.D.   On: 09/05/2018 19:26    Procedures Procedures (including critical care time)  Medications Ordered in ED Medications - No data to display   Initial Impression / Assessment and Plan / ED Course  I have reviewed the triage vital signs and the nursing notes.  Pertinent labs & imaging results that were available during my care of the patient were reviewed by me and considered in my medical decision making (see chart for details).        Patient with fall.  Imaging reassuring but has a likely left patellar fracture.  She is tenderness at the site of the abnormality so I think it potentially is real.  Given knee immobilizer and follow-up as an outpatient.  Final Clinical Impressions(s) / ED Diagnoses   Final diagnoses:  Fall, initial encounter  Closed nondisplaced fracture of left patella, unspecified fracture morphology, initial encounter  Sprain of right ankle, unspecified ligament, initial encounter  Contusion of face, initial encounter    ED Discharge Orders    None       Davonna Belling, MD 09/05/18 2338

## 2018-09-14 ENCOUNTER — Encounter: Payer: Self-pay | Admitting: Orthopedic Surgery

## 2018-09-14 ENCOUNTER — Other Ambulatory Visit: Payer: Self-pay

## 2018-09-14 ENCOUNTER — Ambulatory Visit: Payer: Medicare HMO | Admitting: Orthopedic Surgery

## 2018-09-14 ENCOUNTER — Ambulatory Visit (INDEPENDENT_AMBULATORY_CARE_PROVIDER_SITE_OTHER): Payer: Medicare HMO

## 2018-09-14 DIAGNOSIS — M25562 Pain in left knee: Secondary | ICD-10-CM

## 2018-09-14 NOTE — Progress Notes (Signed)
Office Visit Note   Patient: Stacy [name]           Date of Birth: 1945-05-08           MRN: [id number] Visit Date: 09/14/2018 Requested by: [name], [address] [place], [place] [address] PCP: [name], MD  Subjective: Chief Complaint  Patient presents with  . Left Knee - Pain    HPI: [name] is a patient who sustained a fall a week ago.  She fell and had a direct impact on the left knee.  She has been walking around in the knee immobilizer since that time.  She was seen at the emergency department and diagnosis of possible nondisplaced medial patella fracture was made.  She has been able to go up and down stairs.  Pain is mild.              ROS: All systems reviewed are negative as they relate to the chief complaint within the history of present illness.  Patient denies  fevers or chills.   Assessment & Plan: Visit Diagnoses:  1. Acute pain of left knee     Plan: Impression is left knee pain which is likely a bone bruise.  I do not see a definite medial patella fracture.  She has intact extensor mechanism with no lag.  I would favor Ace wrap plus discontinuation of the knee immobilizer.  Weightbearing as tolerated with return office visit in about 4 weeks just for clinical recheck.  There is no effusion in the left knee.  Follow-Up Instructions: No follow-ups on file.   Orders:  Orders Placed This Encounter  Procedures  . XR KNEE 3 VIEW LEFT   No orders of the defined types were placed in this encounter.     Procedures: No procedures performed   Clinical Data: No additional findings.  Objective: Vital Signs: There were no vitals taken for this visit.  Physical Exam:   Constitutional: Patient appears well-developed HEENT:  Head: Normocephalic Eyes:EOM are normal Neck: Normal range of motion Cardiovascular: Normal rate Pulmonary/chest: Effort normal Neurologic: Patient is alert Skin: Skin is warm Psychiatric: Patient  has normal mood and affect    Ortho Exam: Ortho exam demonstrates full active and passive range of motion of both hips.  Left leg demonstrates full extension against gravity with palpable pedal pulses.  There is no effusion in the left knee.  Collateral and cruciate ligaments are stable.  Patient does have's tenderness over the patella itself but there is no bruising or ecchymosis in this region.  No significant swelling in the patella region also.  Specialty Comments:  No specialty comments available.  Imaging: Xr Knee 3 View Left  Result Date: 09/14/2018 AP lateral merchant left knee reviewed.  Joint spaces maintained in the medial lateral and patellofemoral compartments.  No acute fracture visualized particularly in the patella.  Patella height normal in relation to the distal femur.  Some calcification of the popliteal artery is present.    PMFS History: Patient Active Problem List   Diagnosis Date Noted  . Insomnia 05/19/2018  . Smokers' cough ([place]) 06/23/2017  . Atherosclerosis of aorta ([place]) 06/23/2017  . History of diverticulitis 06/23/2017  . COPD (chronic obstructive pulmonary disease) ([place]) 04/02/2017  . Interstitial lung disease ([place]) 04/02/2017  . Tobacco use 03/23/2017  . BMI 20.0-20.9, adult 02/17/2015  . Poor compliance with medication 02/17/2015  . Medication management 06/10/2014  . Hypertension   . Hyperlipidemia   . Vitamin  D deficiency   . Prediabetes   . DJD (degenerative joint disease)    Past Medical History:  Diagnosis Date  . Carotid bruit    Bilateral  . Colon polyps    with divertirticulitis  . DJD (degenerative joint disease)   . Hyperlipidemia   . Hypertension   . Osteopenia   . Prediabetes   . Vitamin D deficiency     Family History  Problem Relation Age of Onset  . Hyperlipidemia Mother   . Heart disease Mother        7 way bypass/ value replacement/pacemaker  . Heart attack Mother 10  . Diabetes Mother   . Cancer - Other Mother         lung  . Hypertension Father   . Heart attack Father 37  . Cancer - Other Father        throat    Past Surgical History:  Procedure Laterality Date  . CATARACT EXTRACTION Right 03/17/2017  . HAND SURGERY    . head injury  1995  . LAMINECTOMY  2002   L 5   . NECK SURGERY  2012    c4 -5/6  fushion  . TRIGGER FINGER RELEASE     Social History   Occupational History  . Not on file  Tobacco Use  . Smoking status: Current Every Day Smoker  . Smokeless tobacco: Never Used  . Tobacco comment: smokes 2 cigarettes per day  Substance and Sexual Activity  . Alcohol use: No  . Drug use: Not on file  . Sexual activity: Not on file

## 2018-10-12 ENCOUNTER — Other Ambulatory Visit: Payer: Self-pay

## 2018-10-12 ENCOUNTER — Encounter: Payer: Self-pay | Admitting: Orthopedic Surgery

## 2018-10-12 ENCOUNTER — Ambulatory Visit (INDEPENDENT_AMBULATORY_CARE_PROVIDER_SITE_OTHER): Payer: Medicare HMO | Admitting: Orthopedic Surgery

## 2018-10-12 ENCOUNTER — Ambulatory Visit (INDEPENDENT_AMBULATORY_CARE_PROVIDER_SITE_OTHER): Payer: Medicare HMO

## 2018-10-12 DIAGNOSIS — M25562 Pain in left knee: Secondary | ICD-10-CM

## 2018-10-12 NOTE — Progress Notes (Signed)
   Post-Op Visit Note   Patient: Stacy [name]           Date of Birth: [birth year]           MRN: [id number] Visit Date: 10/12/2018 PCP: [name], MD   Assessment & Plan:  Chief Complaint: No chief complaint on file.  Visit Diagnoses:  1. Acute pain of left knee     Plan: [place] is a patient is now about 6 weeks out left knee patella fracture.  She is doing well.  Has minimal discomfort.  Is able go up and down stairs.  On exam she has full extension and no effusion on the left-hand side with good strength.  Radiographs look good.  I will release her at this time.  I do not want her doing any jumping but other activity is fine and I will see her back as needed  Follow-Up Instructions: Return if symptoms worsen or fail to improve.   Orders:  Orders Placed This Encounter  Procedures  . XR Knee 1-2 Views Left   No orders of the defined types were placed in this encounter.   Imaging: Xr Knee 1-2 Views Left  Result Date: 10/12/2018 AP lateral left knee reviewed.  Patella fracture difficult to visualize due to healing.  No other acute injuries present.  Impression is healed patella fracture   PMFS History: Patient Active Problem List   Diagnosis Date Noted  . Insomnia 05/19/2018  . Smokers' cough ([place]) 06/23/2017  . Atherosclerosis of aorta ([place]) 06/23/2017  . History of diverticulitis 06/23/2017  . COPD (chronic obstructive pulmonary disease) ([place]) 04/02/2017  . Interstitial lung disease ([place]) 04/02/2017  . Tobacco use 03/23/2017  . BMI 20.0-20.9, adult 02/17/2015  . Poor compliance with medication 02/17/2015  . Medication management 06/10/2014  . Hypertension   . Hyperlipidemia   . Vitamin D deficiency   . Prediabetes   . DJD (degenerative joint disease)    Past Medical History:  Diagnosis Date  . Carotid bruit    Bilateral  . Colon polyps    with divertirticulitis  . DJD (degenerative joint disease)   . Hyperlipidemia   . Hypertension   . Osteopenia    . Prediabetes   . Vitamin D deficiency     Family History  Problem Relation Age of Onset  . Hyperlipidemia Mother   . Heart disease Mother        7 way bypass/ value replacement/pacemaker  . Heart attack Mother 11  . Diabetes Mother   . Cancer - Other Mother        lung  . Hypertension Father   . Heart attack Father 9  . Cancer - Other Father        throat    Past Surgical History:  Procedure Laterality Date  . CATARACT EXTRACTION Right 03/17/2017  . HAND SURGERY    . head injury  1995  . LAMINECTOMY  2002   L 5   . NECK SURGERY  2012    c4 -5/6  fushion  . TRIGGER FINGER RELEASE     Social History   Occupational History  . Not on file  Tobacco Use  . Smoking status: Current Every Day Smoker  . Smokeless tobacco: Never Used  . Tobacco comment: smokes 2 cigarettes per day  Substance and Sexual Activity  . Alcohol use: No  . Drug use: Not on file  . Sexual activity: Not on file

## 2018-11-24 NOTE — Progress Notes (Signed)
MEDICARE ANNUAL WELLNESS VISIT AND FOLLOW UP  Assessment:   Chronic obstructive pulmonary disease, unspecified COPD type ([place])  - not ready to quit smoking at this time - declines daily inhaler but will do albuterol - when to call the office was discussed, understands progression of disease  Interstitial lung disease ([place]) - not ready to quit smoking at this time - declines daily inhaler but will do albuterol - when to call the office was discussed, understands progression of disease  Hyperlipidemia, mixed -continue medications, check lipids, decrease fatty foods, increase activity.  Declines statins -     ezetimibe (ZETIA) 10 MG tablet; Take 1 tablet daily for Cholesterol -     Lipid panel  Atherosclerosis of aorta (HCC) Control blood pressure, cholesterol, glucose, increase exercise.   History of diverticulitis Increase fiber  Essential hypertension - continue medications, DASH diet, exercise and monitor at home. Call if greater than 130/80.  -     CBC with Differential/Platelet -     BASIC METABOLIC PANEL WITH GFR -     Hepatic function panel -     TSH  Hyperlipidemia, unspecified hyperlipidemia type -continue medications, check lipids, decrease fatty foods, increase activity.  DECLINES STATIN DUE TO MUSCLE ACHES, UNDERSTANDS RISK OF MI/STROKE -     Lipid panel  Medication management -     Magnesium  Vitamin D deficiency -     VITAMIN D 25 Hydroxy (Vit-D Deficiency, Fractures)  Primary osteoarthritis involving multiple joints MONITOR  Body mass index (BMI) of 22.90 in adult Add ensure/boost  Poor compliance with medication Continue meds  Encounter for Medicare annual wellness exam 1 year follow up Colonoscopy- patient declines a colonoscopy even though the risks and benefits were discussed at length. Colon cancer is 3rd most diagnosed cancer and 2nd leading cause of death in both men and women 52 years of age and older. Patient understands the risk of  cancer and death with declining the test.  Tobacco use Smoking cessation-  instruction/counseling given, counseled patient on the dangers of tobacco use, advised patient to stop smoking, and reviewed strategies to maximize success, patient not ready to quit at this time.  -    Low dose CT scan  Smokers' cough ([place]) Low dose CT scan  Advanced care planning/counseling discussion [name], will bring in copy  Over 30 minutes of exam, counseling, chart review and critical decision making was performed Future Appointments  Date Time Provider [place]  03/08/2019  2:00 PM [name], MD [facility] None     Subjective:  Stacy [name] is a 73 y.o. female who presents for Medicare Annual Wellness Visit and 3 MONTH FOLLOW UP.    She had first diverticulitis episode in Dec.  She is DNR.  She continues to smoke and is not interested in quitting. CXR 2018  She has had elevated blood pressure for several years controlled with diet and exercise. Her blood pressure has been controlled at home, today their BP is BP: 122/82 [place]. She denies chest pain, shortness of breath, dizziness.   She works out in the yard and also does some walking everyday.   She is on cholesterol medication  Her cholesterol is not at goal. The cholesterol last visit was:  Started zetia last visit, can not tolerate statins.  Lab Results  Component Value Date   CHOL 269 (H) 08/24/2018   HDL 68 08/24/2018   LDLCALC 163 (H) 08/24/2018   TRIG 226 (H)  08/24/2018   CHOLHDL 4.0 08/24/2018  She is on zetia, unable to tolerate statins.   She does watch her diet as she has asymptomatic prediabetes.   Lab Results  Component Value Date   HGBA1C 5.6 08/24/2018   She will take xanax PRN for sleep.   Last GFR: Lab Results  Component Value Date   GFRNONAA 74 08/24/2018   Patient is on Vitamin D supplement, on 10,000 a day since last visit.   Lab Results  Component Value Date    VD25OH 48 08/24/2018     BMI is Body mass index is 21.95 kg/m., she is working on diet and exercise. Wt Readings from Last 3 Encounters:  11/27/18 120 lb (54.4 kg)  09/05/18 117 lb (53.1 kg)  08/24/18 117 lb (53.1 kg)     Medication Review: Current Outpatient Medications on File Prior to Visit  Medication Sig  . albuterol (PROAIR HFA) 108 (90 Base) MCG/ACT inhaler Inhale 2 puffs into the lungs every 6 (six) hours as needed for wheezing or shortness of breath.  Marland Kitchen alendronate (FOSAMAX) 70 MG tablet Take 1 tablet every 7 days on an empty stomach with only water for 30 minutes  . ALPRAZolam (XANAX) 0.5 MG tablet TAKE 1/2 TO 1 (ONE-HALF TO ONE) TABLET BY MOUTH TWICE DAILY AS NEEDED FOR ANXIETY OR  SLEEP  . aspirin 81 MG tablet Take 162 mg by mouth daily.   Marland Kitchen atenolol (TENORMIN) 25 MG tablet Take 1 tablet Daily for BP  . Calcium Carbonate-Vitamin D (CALCIUM-VITAMIN D) 500-200 MG-UNIT per tablet Take 2 tablets by mouth daily.   . cholecalciferol (VITAMIN D) 1000 units tablet Take 5,000 Units by mouth daily.  Marland Kitchen ezetimibe (ZETIA) 10 MG tablet TAKE 1 TABLET BY MOUTH ONCE DAILY FOR CHOLESTEROL  . vitamin C (ASCORBIC ACID) 500 MG tablet Take 1,000 mg by mouth daily.    No current facility-administered medications on file prior to visit.      Allergies  Allergen Reactions  . Dilaudid [Hydromorphone Hcl] Shortness Of Breath and Swelling  . Fentanyl Shortness Of Breath and Swelling  . Penicillins Anaphylaxis  . Crestor [Rosuvastatin] Other (See Comments)    Joint pain, cramping     Current Problems (verified) Patient Active Problem List   Diagnosis Date Noted  . Insomnia 05/19/2018  . Smokers' cough ([place]) 06/23/2017  . Atherosclerosis of aorta ([place]) 06/23/2017  . History of diverticulitis 06/23/2017  . COPD (chronic obstructive pulmonary disease) ([place]) 04/02/2017  . Interstitial lung disease ([place]) 04/02/2017  . Tobacco use 03/23/2017  . BMI 20.0-20.9, adult 02/17/2015  . Poor  compliance with medication 02/17/2015  . Medication management 06/10/2014  . Hypertension   . Hyperlipidemia   . Vitamin D deficiency   . Prediabetes   . DJD (degenerative joint disease)     Screening Tests Immunization History  Administered Date(s) Administered  . Influenza-Unspecified 01/31/2014, 01/02/2015  . Pneumococcal Conjugate-13 12/01/2016  . Pneumococcal Polysaccharide-23 08/21/2012  . Td 05/04/2003  . Tdap 11/08/2013   Preventative care: Last colonoscopy: 2011 cologuard declined has CT AB 04/2017 Last mammogram: 2020 Last pap smear/pelvic exam:  DEXA: 2020 CXR 2018  Prior vaccinations: TD or Tdap: 2015  Influenza: 2016 Declines Pneumococcal: 2014 Prevnar13:  2018 Shingles/Zostavax: N/A  Names of Other Physician/Practitioners you currently use: 1. [facility] here for primary care 2. Dr. Ronnald Ramp, eye doctor, last visit 2018 3. Dr. Lynnette Caffey, dentist, last visit 2018 Patient Care Team: [name], MD as PCP - General (  Internal Medicine) Etta Quill, [place] as Referring Physician (Optometry) Luretha Rued, Carney Living, DDS (Dentistry) Sypher, Herbie [place], MD (Inactive) as Consulting Physician (Orthopedic Surgery) Marylynn Pearson, MD as Consulting Physician (Obstetrics and Gynecology) Juanita Craver, MD as Consulting Physician (Gastroenterology)  SURGICAL HISTORY She  has a past surgical history that includes Neck surgery (2012 ); Laminectomy (2002); Hand surgery; Trigger finger release; head injury (1995); and Cataract extraction (Right, 03/17/2017). FAMILY HISTORY Her family history includes Cancer - Other in her father and mother; Diabetes in her mother; Heart attack (age of onset: 27) in her father; Heart attack (age of onset: 73) in her mother; Heart disease in her mother; Hyperlipidemia in her mother; Hypertension in her father. SOCIAL HISTORY She  reports that she has been smoking. She has never used smokeless tobacco. She reports  that she does not drink alcohol.   MEDICARE WELLNESS OBJECTIVES: Physical activity: Current Exercise Habits: The patient does not participate in regular exercise at present Cardiac risk factors: Cardiac Risk Factors include: advanced age (>12men, >29 women);hypertension;dyslipidemia;smoking/ tobacco exposure;family history of premature cardiovascular disease Depression/mood screen:   Depression screen [facility] 2/9 11/27/2018  Decreased Interest 0  Down, Depressed, Hopeless 0  PHQ - 2 Score 0    ADLs:  In your present state of health, do you have any difficulty performing the following activities: 11/27/2018 08/27/2018  Hearing? N N  Vision? N N  Difficulty concentrating or making decisions? N N  Walking or climbing stairs? N N  Dressing or bathing? N N  Doing errands, shopping? N N  Some recent data might be hidden     Cognitive Testing  Alert? Yes  Normal Appearance?Yes  Oriented to person? Yes  Place? Yes   Time? Yes  Recall of three objects?  Yes  Can perform simple calculations? Yes  Displays appropriate judgment?Yes  Can read the correct time from a watch face?Yes  EOL planning: Does Patient Have a Medical Advance Directive?: Yes Type of Advance Directive: Out of facility DNR (pink MOST or yellow form), Living will, Healthcare Power of Attorney Does patient want to make changes to medical advance directive?: No - Patient declined  Review of Systems  Constitutional: Negative for chills, fever and malaise/fatigue.  HENT: Negative for congestion, ear pain and sore throat.   Eyes: Negative.   Respiratory: Negative for cough, shortness of breath and wheezing.   Cardiovascular: Negative for chest pain, palpitations and leg swelling.  Gastrointestinal: Negative for abdominal pain, blood in stool, constipation, diarrhea, heartburn and melena.  Genitourinary: Negative.   Skin: Negative.   Neurological: Negative for dizziness, sensory change, loss of consciousness and headaches.   Psychiatric/Behavioral: Negative for depression. The patient is not nervous/anxious and does not have insomnia.      Objective:     Today's Vitals   11/27/18 1426  BP: 122/82  Pulse: 64  Temp: 97.7 F (36.5 C)  SpO2: 96%  Weight: 120 lb (54.4 kg)   Body mass index is 21.95 kg/m.  General appearance: alert, skinny, no distress, WD/WN, female HEENT: normocephalic, sclerae anicteric, TMs pearly, nares patent, no discharge or erythema, pharynx normal Oral cavity: MMM, no lesions Neck: supple, + right cervical/submandicular lymphadenopathy nontender, hard, mobile, no thyromegaly, no masses Heart: RRR, normal S1, S2, no murmurs Lungs: CTA bilaterally, + wheezes bilateral lower lobes without rhonchi, or rales Abdomen: +bs, soft, non tender, non distended, no masses, no hepatomegaly, no splenomegaly Musculoskeletal: nontender, no swelling, no obvious deformity Extremities: no edema, no cyanosis, no clubbing Pulses: 2+ symmetric, upper  and lower extremities, normal cap refill Neurological: alert, oriented x 3, CN2-12 intact, strength normal upper extremities and lower extremities, sensation normal throughout, DTRs 2+ throughout, no cerebellar signs, gait normal Psychiatric: normal affect, behavior normal, pleasant   Medicare Attestation I have personally reviewed: The patient's medical and social history Their use of alcohol, tobacco or illicit drugs Their current medications and supplements The patient's functional ability including ADLs,fall risks, home safety risks, cognitive, and hearing and visual impairment Diet and physical activities Evidence for depression or mood disorders  The patient's weight, height, BMI, and visual acuity have been recorded in the chart.  I have made referrals, counseling, and provided education to the patient based on review of the above and I have provided the patient with a written personalized care plan for preventive services.     Vicie Mutters, PA-C   11/27/2018

## 2018-11-27 ENCOUNTER — Ambulatory Visit (INDEPENDENT_AMBULATORY_CARE_PROVIDER_SITE_OTHER): Payer: Medicare HMO | Admitting: Physician Assistant

## 2018-11-27 ENCOUNTER — Other Ambulatory Visit: Payer: Self-pay

## 2018-11-27 ENCOUNTER — Encounter: Payer: Self-pay | Admitting: Physician Assistant

## 2018-11-27 VITALS — BP 122/82 | HR 64 | Temp 97.7°F | Wt 120.0 lb

## 2018-11-27 DIAGNOSIS — J449 Chronic obstructive pulmonary disease, unspecified: Secondary | ICD-10-CM

## 2018-11-27 DIAGNOSIS — Z79899 Other long term (current) drug therapy: Secondary | ICD-10-CM | POA: Diagnosis not present

## 2018-11-27 DIAGNOSIS — Z122 Encounter for screening for malignant neoplasm of respiratory organs: Secondary | ICD-10-CM

## 2018-11-27 DIAGNOSIS — J849 Interstitial pulmonary disease, unspecified: Secondary | ICD-10-CM

## 2018-11-27 DIAGNOSIS — E559 Vitamin D deficiency, unspecified: Secondary | ICD-10-CM | POA: Diagnosis not present

## 2018-11-27 DIAGNOSIS — I700 Atherosclerosis of aorta: Secondary | ICD-10-CM

## 2018-11-27 DIAGNOSIS — E785 Hyperlipidemia, unspecified: Secondary | ICD-10-CM | POA: Diagnosis not present

## 2018-11-27 DIAGNOSIS — G4700 Insomnia, unspecified: Secondary | ICD-10-CM

## 2018-11-27 DIAGNOSIS — I10 Essential (primary) hypertension: Secondary | ICD-10-CM

## 2018-11-27 DIAGNOSIS — Z0001 Encounter for general adult medical examination with abnormal findings: Secondary | ICD-10-CM | POA: Diagnosis not present

## 2018-11-27 DIAGNOSIS — R6889 Other general symptoms and signs: Secondary | ICD-10-CM

## 2018-11-27 DIAGNOSIS — J410 Simple chronic bronchitis: Secondary | ICD-10-CM | POA: Diagnosis not present

## 2018-11-27 DIAGNOSIS — M150 Primary generalized (osteo)arthritis: Secondary | ICD-10-CM

## 2018-11-27 DIAGNOSIS — R7309 Other abnormal glucose: Secondary | ICD-10-CM | POA: Diagnosis not present

## 2018-11-27 DIAGNOSIS — Z720 Tobacco use: Secondary | ICD-10-CM | POA: Diagnosis not present

## 2018-11-27 DIAGNOSIS — M159 Polyosteoarthritis, unspecified: Secondary | ICD-10-CM

## 2018-11-28 LAB — HEMOGLOBIN A1C
Hgb A1c MFr Bld: 5.7 % of total Hgb — ABNORMAL HIGH (ref <5.7)
Mean Plasma Glucose: 117 (calc)
eAG (mmol/L): 6.5 (calc)

## 2018-11-28 LAB — CBC WITH DIFFERENTIAL/PLATELET
Absolute Monocytes: 369 cells/uL (ref 200–950)
Basophils Absolute: 47 cells/uL (ref 0–200)
Basophils Relative: 0.7 %
Eosinophils Absolute: 208 cells/uL (ref 15–500)
Eosinophils Relative: 3.1 %
HCT: 43.2 % (ref 35.0–45.0)
Hemoglobin: 14.4 g/dL (ref 11.7–15.5)
Lymphs Abs: 2546 cells/uL (ref 850–3900)
MCH: 32.9 pg (ref 27.0–33.0)
MCHC: 33.3 g/dL (ref 32.0–36.0)
MCV: 98.6 fL (ref 80.0–100.0)
MPV: 10.1 fL (ref 7.5–12.5)
Monocytes Relative: 5.5 %
Neutro Abs: 3531 cells/uL (ref 1500–7800)
Neutrophils Relative %: 52.7 %
Platelets: 243 10*3/uL (ref 140–400)
RBC: 4.38 10*6/uL (ref 3.80–5.10)
RDW: 12.8 % (ref 11.0–15.0)
Total Lymphocyte: 38 %
WBC: 6.7 10*3/uL (ref 3.8–10.8)

## 2018-11-28 LAB — LIPID PANEL
Cholesterol: 281 mg/dL — ABNORMAL HIGH (ref <200)
HDL: 60 mg/dL (ref >=50)
LDL Cholesterol (Calc): 165 mg/dL (calc) — ABNORMAL HIGH
Non-HDL Cholesterol (Calc): 221 mg/dL (calc) — ABNORMAL HIGH (ref <130)
Total CHOL/HDL Ratio: 4.7 (calc) (ref <5.0)
Triglycerides: 333 mg/dL — ABNORMAL HIGH (ref <150)

## 2018-11-28 LAB — COMPLETE METABOLIC PANEL WITH GFR
AG Ratio: 1.3 (calc) (ref 1.0–2.5)
ALT: 8 U/L (ref 6–29)
AST: 17 U/L (ref 10–35)
Albumin: 4.3 g/dL (ref 3.6–5.1)
Alkaline phosphatase (APISO): 71 U/L (ref 37–153)
BUN: 9 mg/dL (ref 7–25)
CO2: 25 mmol/L (ref 20–32)
Calcium: 9.8 mg/dL (ref 8.6–10.4)
Chloride: 100 mmol/L (ref 98–110)
Creat: 0.8 mg/dL (ref 0.60–0.93)
GFR, Est African American: 84 mL/min/{1.73_m2} (ref >=60)
GFR, Est Non African American: 73 mL/min/{1.73_m2} (ref >=60)
Globulin: 3.2 g/dL (calc) (ref 1.9–3.7)
Glucose, Bld: 85 mg/dL (ref 65–99)
Potassium: 4.3 mmol/L (ref 3.5–5.3)
Sodium: 136 mmol/L (ref 135–146)
Total Bilirubin: 0.4 mg/dL (ref 0.2–1.2)
Total Protein: 7.5 g/dL (ref 6.1–8.1)

## 2018-11-28 LAB — TSH: TSH: 1.57 mIU/L (ref 0.40–4.50)

## 2018-11-28 LAB — MAGNESIUM: Magnesium: 1.9 mg/dL (ref 1.5–2.5)

## 2018-11-28 LAB — VITAMIN D 25 HYDROXY (VIT D DEFICIENCY, FRACTURES): Vit D, 25-Hydroxy: 58 ng/mL (ref 30–100)

## 2018-12-11 ENCOUNTER — Telehealth: Payer: Self-pay

## 2018-12-11 ENCOUNTER — Telehealth: Payer: Self-pay | Admitting: Physician Assistant

## 2018-12-11 ENCOUNTER — Ambulatory Visit
Admission: RE | Admit: 2018-12-11 | Discharge: 2018-12-11 | Disposition: A | Discharge status: Home / Self Care | Payer: Medicare HMO | Source: Ambulatory Visit | Attending: Physician Assistant | Admitting: Physician Assistant

## 2018-12-11 DIAGNOSIS — R69 Illness, unspecified: Secondary | ICD-10-CM | POA: Diagnosis not present

## 2018-12-11 DIAGNOSIS — Z122 Encounter for screening for malignant neoplasm of respiratory organs: Secondary | ICD-10-CM

## 2018-12-11 NOTE — Telephone Encounter (Signed)
Received CT chest results.    IMPRESSION: 1. Right upper lobe mass with adjacent right upper lobe nodules and mediastinal/right hilar adenopathy, findings most consistent with primary bronchogenic carcinoma. Lung-RADS 4B, suspicious. Additional imaging evaluation or consultation with Pulmonology or Thoracic Surgery recommended. These results will be called to the ordering clinician or representative by the Radiologist Assistant, and communication documented in the PACS or zVision Dashboard. 2. Associated obstruction of right upper lobe bronchi with volume loss in the medial aspect of the right upper lobe. 3. Aortic atherosclerosis (ICD10-170.0). Coronary artery calcification. 4.  Emphysema (ICD10-J43.9).  Called to discuss with the patient.  At our last office visit she discussed that she was DNR, she did not want any life saving treatments that could disrupt her quality of life. She states she has two grandkids that are in college that she is helping them settle into college over the next 2 weeks and then she will discuss options.   She is not interested in surgery or chemotherapy.  I discussed that I am not a specialist but I would prefer if she discussed potential options that can help her quality of life as the disease progresses and there may be options such as localized radiation, etc that can be offered.    Declines PFTs, PET scan or referral at this time. Will call back in 2 weeks.  Discussed can call us at anytime.  No SI at this time, patient denies.

## 2018-12-11 NOTE — Telephone Encounter (Signed)
CT of lungs results printed out & given to provider

## 2018-12-21 ENCOUNTER — Telehealth: Payer: Self-pay | Admitting: Physician Assistant

## 2018-12-21 DIAGNOSIS — C3411 Malignant neoplasm of upper lobe, right bronchus or lung: Secondary | ICD-10-CM

## 2018-12-21 NOTE — Telephone Encounter (Signed)
Called patient back to discuss CT scan results further after she has gotten her grand kids settled into college. She is very active with her family. She states she is not interested in treatment at all because she is afraid that treatment will result in her not being able to function, have quality of life, or enjoy her family. I did discuss that there are palliative options or molecular therapy that she can tolerate well but she would need to talk with an oncologist or specialist to go over these options, I'm limited in my knowledge on cancer treatment, and it is on a case by case basis with patients what can be offered. She declines at this time but has agreed to do the PET scan so we can have a better staging. We will start with this and follow up after that.  She has contacted her local hospice in [place] to start the program early if she chooses to not pursue any treatment.   IMPRESSION: 1. Right upper lobe mass with adjacent right upper lobe nodules and mediastinal/right hilar adenopathy, findings most consistent with primary bronchogenic carcinoma. Lung-RADS 4B, suspicious. Additional imaging evaluation or consultation with Pulmonology or Thoracic Surgery recommended. These results will be called to the ordering clinician or representative by the Radiologist Assistant, and communication documented in the PACS or zVision Dashboard. 2. Associated obstruction of right upper lobe bronchi with volume loss in the medial aspect of the right upper lobe. 3. Aortic atherosclerosis (ICD10-170.0). Coronary artery calcification. 4.  Emphysema (ICD10-J43.9).

## 2019-01-03 ENCOUNTER — Other Ambulatory Visit: Payer: Self-pay

## 2019-01-03 ENCOUNTER — Encounter (HOSPITAL_COMMUNITY)
Admission: RE | Admit: 2019-01-03 | Discharge: 2019-01-03 | Disposition: A | Discharge status: Home / Self Care | Payer: Medicare HMO | Source: Ambulatory Visit | Attending: Physician Assistant | Admitting: Physician Assistant

## 2019-01-03 DIAGNOSIS — R590 Localized enlarged lymph nodes: Secondary | ICD-10-CM | POA: Insufficient documentation

## 2019-01-03 DIAGNOSIS — J439 Emphysema, unspecified: Secondary | ICD-10-CM | POA: Insufficient documentation

## 2019-01-03 DIAGNOSIS — C3411 Malignant neoplasm of upper lobe, right bronchus or lung: Secondary | ICD-10-CM | POA: Insufficient documentation

## 2019-01-03 DIAGNOSIS — I700 Atherosclerosis of aorta: Secondary | ICD-10-CM | POA: Insufficient documentation

## 2019-01-03 DIAGNOSIS — C3490 Malignant neoplasm of unspecified part of unspecified bronchus or lung: Secondary | ICD-10-CM | POA: Diagnosis not present

## 2019-01-03 LAB — GLUCOSE, CAPILLARY: Glucose-Capillary: 122 mg/dL — ABNORMAL HIGH (ref 70–99)

## 2019-01-03 MED ORDER — FLUDEOXYGLUCOSE F - 18 (FDG) INJECTION
6.0000 | Freq: Once | INTRAVENOUS | Status: AC | PRN
  Administered 2019-01-03: 10:00:00 6 via INTRAVENOUS

## 2019-01-04 ENCOUNTER — Telehealth: Payer: Self-pay | Admitting: Physician Assistant

## 2019-01-04 DIAGNOSIS — C3411 Malignant neoplasm of upper lobe, right bronchus or lung: Secondary | ICD-10-CM

## 2019-01-04 NOTE — Telephone Encounter (Signed)
Possible 2Ta, M1b- likely stage IV non small cell lung cancer with mets outside the thoracic chest wall.  Discussed findings with the patients.  She is willing to see oncology to discuss possible palliative treatments but is very clear that she does not want to affect her quality of life.  She is very hesitant to go and is afraid that her wishes/goals of care will not be respected, I assured her that there are newer treatments or possibilities that I do not know of that she needs to see a specialist to discuss. Also I discussed with her, if there is not a possibility to maintain quality of life with treatment, that the specialist need to be the one to determine that. She agrees to go.   Currently when I called she was painting her living room. She is very active with her friends, family (she has 3 kids) and her grandkids, both in college, one in [place] and one in [place].   She has not had any weight loss or night sweats.  No SOB, CP.  She continues to smoke.   Wt Readings from Last 3 Encounters:  11/27/18 120 lb (54.4 kg)  09/05/18 117 lb (53.1 kg)  08/24/18 117 lb (53.1 kg)

## 2019-01-09 ENCOUNTER — Encounter: Payer: Self-pay | Admitting: *Deleted

## 2019-01-09 DIAGNOSIS — R918 Other nonspecific abnormal finding of lung field: Secondary | ICD-10-CM

## 2019-01-09 NOTE — Progress Notes (Signed)
Oncology Nurse Navigator Documentation  Oncology Nurse Navigator Flowsheets 01/09/2019  Navigator Location [facility]  Referral Date to RadOnc/MedOnc 01/09/2019  Navigator Encounter Type Telephone/I received referral on Stacy [name] today.  I called her to scheduled her to be [facility] at [facility] this [facility].  Patient verbalized understanding of appt time and place.   Telephone Outgoing Call  Treatment Phase Abnormal Scans  Barriers/Navigation Needs Coordination of Care;Education  Education Other  Interventions Coordination of Care;Education  Coordination of Care Appts  Education Method Verbal  Acuity Level 1  Time Spent with Patient 30

## 2019-01-10 ENCOUNTER — Telehealth: Payer: Self-pay | Admitting: *Deleted

## 2019-01-10 NOTE — Telephone Encounter (Signed)
Oncology Nurse Navigator Documentation  Oncology Nurse Navigator Flowsheets 01/10/2019  Navigator Location [facility]  Referral Date to RadOnc/MedOnc -  Navigator Encounter Type Telephone/I updated Dr. Julien Nordmann on patient's latest scan.  He asked that patient see all disciplines at [facility] this week.  I called patient to update. She verbalized understanding.   Telephone Outgoing Call  Treatment Phase Abnormal Scans  Barriers/Navigation Needs Education  Education Other  Interventions Education  Coordination of Care -  Education Method Verbal  Acuity Level 2  Time Spent with Patient 65

## 2019-01-11 ENCOUNTER — Other Ambulatory Visit: Payer: Self-pay

## 2019-01-11 ENCOUNTER — Inpatient Hospital Stay: Payer: Medicare HMO | Attending: Internal Medicine | Admitting: Internal Medicine

## 2019-01-11 ENCOUNTER — Inpatient Hospital Stay: Payer: Medicare HMO

## 2019-01-11 ENCOUNTER — Other Ambulatory Visit: Payer: Self-pay | Admitting: *Deleted

## 2019-01-11 ENCOUNTER — Ambulatory Visit
Admission: RE | Admit: 2019-01-11 | Discharge: 2019-01-11 | Disposition: A | Discharge status: Home / Self Care | Payer: Medicare HMO | Source: Ambulatory Visit | Attending: Radiation Oncology | Admitting: Radiation Oncology

## 2019-01-11 ENCOUNTER — Telehealth: Payer: Self-pay | Admitting: *Deleted

## 2019-01-11 ENCOUNTER — Institutional Professional Consult (permissible substitution) (INDEPENDENT_AMBULATORY_CARE_PROVIDER_SITE_OTHER): Payer: Medicare HMO | Admitting: Thoracic Surgery (Cardiothoracic Vascular Surgery)

## 2019-01-11 ENCOUNTER — Encounter: Payer: Self-pay | Admitting: Internal Medicine

## 2019-01-11 VITALS — BP 134/91 | HR 75 | Temp 97.8°F | Resp 17 | Ht 62.0 in | Wt 116.2 lb

## 2019-01-11 DIAGNOSIS — J984 Other disorders of lung: Secondary | ICD-10-CM | POA: Diagnosis not present

## 2019-01-11 DIAGNOSIS — Z79899 Other long term (current) drug therapy: Secondary | ICD-10-CM

## 2019-01-11 DIAGNOSIS — R69 Illness, unspecified: Secondary | ICD-10-CM | POA: Diagnosis not present

## 2019-01-11 DIAGNOSIS — R918 Other nonspecific abnormal finding of lung field: Secondary | ICD-10-CM | POA: Insufficient documentation

## 2019-01-11 DIAGNOSIS — Z720 Tobacco use: Secondary | ICD-10-CM

## 2019-01-11 DIAGNOSIS — C3411 Malignant neoplasm of upper lobe, right bronchus or lung: Secondary | ICD-10-CM | POA: Insufficient documentation

## 2019-01-11 DIAGNOSIS — R911 Solitary pulmonary nodule: Secondary | ICD-10-CM

## 2019-01-11 DIAGNOSIS — R590 Localized enlarged lymph nodes: Secondary | ICD-10-CM

## 2019-01-11 DIAGNOSIS — M8580 Other specified disorders of bone density and structure, unspecified site: Secondary | ICD-10-CM | POA: Insufficient documentation

## 2019-01-11 DIAGNOSIS — E785 Hyperlipidemia, unspecified: Secondary | ICD-10-CM | POA: Insufficient documentation

## 2019-01-11 DIAGNOSIS — J439 Emphysema, unspecified: Secondary | ICD-10-CM

## 2019-01-11 DIAGNOSIS — F17210 Nicotine dependence, cigarettes, uncomplicated: Secondary | ICD-10-CM | POA: Diagnosis not present

## 2019-01-11 DIAGNOSIS — I10 Essential (primary) hypertension: Secondary | ICD-10-CM | POA: Diagnosis not present

## 2019-01-11 DIAGNOSIS — Z801 Family history of malignant neoplasm of trachea, bronchus and lung: Secondary | ICD-10-CM

## 2019-01-11 DIAGNOSIS — Z7982 Long term (current) use of aspirin: Secondary | ICD-10-CM | POA: Insufficient documentation

## 2019-01-11 DIAGNOSIS — C3490 Malignant neoplasm of unspecified part of unspecified bronchus or lung: Secondary | ICD-10-CM

## 2019-01-11 LAB — CMP (CANCER CENTER ONLY)
ALT: 8 U/L (ref 0–44)
AST: 16 U/L (ref 15–41)
Albumin: 4 g/dL (ref 3.5–5.0)
Alkaline Phosphatase: 90 U/L (ref 38–126)
Anion gap: 7 (ref 5–15)
BUN: 8 mg/dL (ref 8–23)
CO2: 27 mmol/L (ref 22–32)
Calcium: 10 mg/dL (ref 8.9–10.3)
Chloride: 103 mmol/L (ref 98–111)
Creatinine: 0.85 mg/dL (ref 0.44–1.00)
GFR, Est AFR Am: >60 mL/min (ref >60)
GFR, Estimated: >60 mL/min (ref >60)
Glucose, Bld: 98 mg/dL (ref 70–99)
Potassium: 4.4 mmol/L (ref 3.5–5.1)
Sodium: 137 mmol/L (ref 135–145)
Total Bilirubin: <0.2 mg/dL — ABNORMAL LOW (ref 0.3–1.2)
Total Protein: 8 g/dL (ref 6.5–8.1)

## 2019-01-11 LAB — CBC WITH DIFFERENTIAL (CANCER CENTER ONLY)
Abs Immature Granulocytes: 0.02 10*3/uL (ref 0.00–0.07)
Basophils Absolute: 0 10*3/uL (ref 0.0–0.1)
Basophils Relative: 1 %
Eosinophils Absolute: 0.2 10*3/uL (ref 0.0–0.5)
Eosinophils Relative: 3 %
HCT: 42.3 % (ref 36.0–46.0)
Hemoglobin: 14.3 g/dL (ref 12.0–15.0)
Immature Granulocytes: 0 %
Lymphocytes Relative: 26 %
Lymphs Abs: 2 10*3/uL (ref 0.7–4.0)
MCH: 33.1 pg (ref 26.0–34.0)
MCHC: 33.8 g/dL (ref 30.0–36.0)
MCV: 97.9 fL (ref 80.0–100.0)
Monocytes Absolute: 0.5 10*3/uL (ref 0.1–1.0)
Monocytes Relative: 6 %
Neutro Abs: 5 10*3/uL (ref 1.7–7.7)
Neutrophils Relative %: 64 %
Platelet Count: 247 10*3/uL (ref 150–400)
RBC: 4.32 MIL/uL (ref 3.87–5.11)
RDW: 12.4 % (ref 11.5–15.5)
WBC Count: 7.8 10*3/uL (ref 4.0–10.5)
nRBC: 0 % (ref 0.0–0.2)

## 2019-01-11 NOTE — H&P (View-Only) (Signed)
PCP is [name], MD Referring Provider is Vicie Mutters, PA-C  No chief complaint on file.   HPI: 73 yo woman with new right upper lobe lung mass with mediastinal adenopathy.  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 She recently had an annual wellness visit. She noted cough and shortness of breath with activity. A LDCT of the chest was done which showed a right upper lobe mass and right hilar and mediastinal adenopathy. There also was coronary and aortic atherosclerosis, COPD and probable pulmonary fibrosis.  She has a chronic cough and baseline shortness of breath with activity. No recent change. She also has occasional wheezing. No hemoptysis, headaches, visual changes, bone pain, change in appetite or weight loss. Has history of "dysrhythmias", no chest pain, tightness or pressure recently.  Zubrod Score: At the time of surgery this patient's most appropriate activity status/level should be described as: []     0    Normal activity, no symptoms [x]     1    Restricted in physical strenuous activity but ambulatory, able to do out light work []     2    Ambulatory and capable of self care, unable to do work activities, up and about >50 % of waking hours                              []     3    Only limited self care, in bed greater than 50% of waking hours []     4    Completely disabled, no self care, confined to bed or chair []     5    Moribund  Past Medical History:  Diagnosis Date  . Carotid bruit    Bilateral  . Colon polyps    with divertirticulitis  . DJD (degenerative joint disease)   . Hyperlipidemia   . Hypertension   . Osteopenia   . Prediabetes   . Vitamin D deficiency     Past Surgical History:  Procedure Laterality Date  . CATARACT EXTRACTION Right 03/17/2017  . HAND SURGERY    . head injury  1995  . LAMINECTOMY  2002   L 5   . NECK  SURGERY  2012    c4 -5/6  fushion  . TRIGGER FINGER RELEASE      Family History  Problem Relation Age of Onset  . Hyperlipidemia Mother   . Heart disease Mother        7 way bypass/ value replacement/pacemaker  . Heart attack Mother 43  . Diabetes Mother   . Cancer - Other Mother        lung  . Hypertension Father   . Heart attack Father 33  . Cancer - Other Father        throat    Social History Social History   Tobacco Use  . Smoking status: Current Every Day Smoker  . Smokeless tobacco: Never Used  . Tobacco comment: smokes 2 cigarettes per day  Substance Use Topics  . Alcohol use: No  . Drug use: Not on file    Current Outpatient Medications  Medication Sig Dispense Refill  . albuterol (PROAIR HFA) 108 (90 Base) MCG/ACT inhaler Inhale 2 puffs into the lungs every 6 (six) hours as needed for wheezing or shortness of breath. (Patient not taking: Reported on 01/11/2019) 18 g 1  .  ALPRAZolam (XANAX) 0.5 MG tablet TAKE 1/2 TO 1 (ONE-HALF TO ONE) TABLET BY MOUTH TWICE DAILY AS NEEDED FOR ANXIETY OR  SLEEP 60 tablet 2  . aspirin 81 MG tablet Take 162 mg by mouth daily.     Marland Kitchen atenolol (TENORMIN) 25 MG tablet Take 1 tablet Daily for BP 90 tablet 3  . cholecalciferol (VITAMIN D) 1000 units tablet Take 5,000 Units by mouth daily.    . diphenhydramine-acetaminophen (TYLENOL PM) 25-500 MG TABS tablet Take 1 tablet by mouth at bedtime as needed.    . ezetimibe (ZETIA) 10 MG tablet TAKE 1 TABLET BY MOUTH ONCE DAILY FOR CHOLESTEROL 90 tablet 0  . vitamin C (ASCORBIC ACID) 500 MG tablet Take 1,000 mg by mouth daily.      No current facility-administered medications for this visit.     Allergies  Allergen Reactions  . Dilaudid [Hydromorphone Hcl] Shortness Of Breath and Swelling  . Fentanyl Shortness Of Breath and Swelling  . Penicillins Anaphylaxis  . Crestor [Rosuvastatin] Other (See Comments)    Joint pain, cramping     Review of Systems  There were no vitals taken for  this visit. Physical Exam   Diagnostic Tests: CT CHEST WITHOUT CONTRAST LOW-DOSE FOR LUNG CANCER SCREENING  TECHNIQUE: Multidetector CT imaging of the chest was performed following the standard protocol without IV contrast.  COMPARISON:  None.  FINDINGS: Cardiovascular: Atherosclerotic calcification of the aorta, aortic valve and coronary arteries. Heart size normal. No pericardial effusion.  Mediastinum/Nodes: Low right paratracheal lymph node measures 2.2 cm. AP window lymph nodes measure up to 8 mm. Subcarinal lymph node measures 1.6 cm. There is right hilar adenopathy but it is difficult to measure without IV contrast. No axillary adenopathy. Esophagus is grossly unremarkable.  Lungs/Pleura: Centrilobular and paraseptal emphysema. Superimposed interstitial coarsening and subpleural reticulation. Peripheral area of nodular consolidation in the apical right upper lobe has a volume derived mean of at least 2.7 cm. 2 dimensional measurements are 2.2 x 3.8 cm (11/57). Nodules in the central and medial right upper lobe measure 1.9 cm (11/73) and 2.3 cm (11/85). There is obstruction of right upper lobe bronchi with postobstructive collapse in the medial right upper lobe. 6.2 mm subpleural nodule in the left lower lobe (11/138). No pleural fluid.  Upper Abdomen: Visualized portions of the liver, adrenal glands, spleen, pancreas, stomach and bowel are grossly unremarkable.  Musculoskeletal: Degenerative changes in the spine. No worrisome lytic or sclerotic lesions.  IMPRESSION: 1. Right upper lobe mass with adjacent right upper lobe nodules and mediastinal/right hilar adenopathy, findings most consistent with primary bronchogenic carcinoma. Lung-RADS 4B, suspicious. Additional imaging evaluation or consultation with Pulmonology or Thoracic Surgery recommended. These results will be called to the ordering clinician or representative by the Radiologist Assistant,  and communication documented in the PACS or zVision Dashboard. 2. Associated obstruction of right upper lobe bronchi with volume loss in the medial aspect of the right upper lobe. 3. Aortic atherosclerosis (ICD10-170.0). Coronary artery calcification. 4.  Emphysema (ICD10-J43.9).   Electronically Signed   By: Lorin Picket M.D.   On: 12/11/2018 14:22 NUCLEAR MEDICINE PET SKULL BASE TO THIGH  TECHNIQUE: 6.0 mCi F-18 FDG was injected intravenously. Full-ring PET imaging was performed from the skull base to thigh after the radiotracer. CT data was obtained and used for attenuation correction and anatomic localization.  Fasting blood glucose: 122 mg/dl  COMPARISON:  Lung cancer screening chest CT 12/11/2018  FINDINGS: Mediastinal blood pool activity: SUV max 2.5  Liver activity:  SUV max NA  NECK: Small hypermetabolic right level II lymph node measures 7 mm short axis (image 26/series 4) with SUV max = 3.6. No other hypermetabolic lymphadenopathy in the neck.  Incidental CT findings: None.  CHEST: 8 mm short axis high right paratracheal node (82/9) is hypermetabolic. Bulky 2.8 cm short axis precarinal lymph node is hypermetabolic with SUV max = 93.7. Conglomerate adenopathy in the right hilum is markedly hypermetabolic with SUV max = 16.9.  The peripheral right upper lobe lung mass measured at 2.2 x 3.8 cm on previous lung cancer screening CT is hypermetabolic with SUV max = 67.8. 13 mm right upper lobe nodule between the dominant peripheral mass and the hilum is hypermetabolic with SUV max = 93.8  No left hilar hypermetabolic metastases. No evidence for hypermetabolic lymphadenopathy in the hilar regions.  Incidental CT findings: noneAtherosclerotic calcification is noted in the wall of the thoracic aorta. Coronary artery calcification is evident. Centrilobular and paraseptal emphysema evident. No evidence of pleural effusion.  ABDOMEN/PELVIS: No  abnormal hypermetabolic activity within the liver, pancreas, adrenal glands, or spleen. No hypermetabolic lymph nodes in the abdomen or pelvis.  Incidental CT findings: Abdominal aortic atherosclerosis noted with fusiform distention of the infrarenal abdominal aorta up to 2.5 cm maximum diameter.  SKELETON: No focal hypermetabolic activity to suggest skeletal metastasis.  Incidental CT findings: none  IMPRESSION: 1. Peripheral right upper lobe pulmonary mass is markedly hypermetabolic with adjacent hypermetabolic satellite nodules and hypermetabolic right hilar and mediastinal lymphadenopathy. Small right cervical lymph node demonstrates hypermetabolism, also suspicious for metastatic involvement. 2. No evidence for hypermetabolic metastatic disease in the abdomen or pelvis. 3.  Aortic Atherosclerois (ICD10-170.0) 4.  Emphysema. ([id number]-Z02.9)   Electronically Signed   By: Misty Stanley M.D.   On: 01/03/2019 14:19 I personally reviewed the CT and PET images and concur with the findings noted above   Impression: Stacy [name] is a 73 yo woman with a history of tobacco abuse, COPD, ILD/ Pulmonary fibrosis, hypertension, hyperlipidemia, coronary and thoracic aortic atherosclerosis. She recently had a low dose screening CT for lung cancer which revealed a right upper lobe mass with hilar and mediastinal adenopathy. PET showed these areas were hypermetabolic. She has at least stage IIIA disease (unresectable). Questionable cervical node which would be stage IV. This is most likely non-small cell carcinoma, but small cell is also a possibility.  She needs a tissue diagnosis to confirm diagnosis and guide therapy.  We discussed options of CT guided v bronchoscopic biopsy. I informed her of the advantages and disadvantages of each approach. She prefers bronchoscopy.  I informed her of the general nature of bronchoscopy and EBUS. She understands the need for general anesthesia. We  will plan to do the procedure on an outpatient basis. I informed her of the indications, risks, benefits and alternatives. She understands the risks include those associated with general anesthesia. She understands the risks include but are not limited to death, MI, DVT, PE, bleeding, pneumothorax as well as the possibility of other unforeseeable complications. She understands there is no guarantee of a definitive diagnosis.  She accepts the risks and agrees to proceed.  Thoracic aortic and coronary atherosclerosis- asymptomatic  Plan: Bronch, EBUS next week My office will contact patient to schedule  Melrose Nakayama, MD [facility] [phone]

## 2019-01-11 NOTE — Telephone Encounter (Signed)
Oncology Nurse Navigator Documentation  Oncology Nurse Navigator Flowsheets 01/11/2019  Navigator Location [facility]  Referral Date to RadOnc/MedOnc -  Navigator Encounter Type Clinic/MDC;Telephone/I spoke with patient today at thoracic clinic.  I helped to explain next steps.   I reached out to autho coordinator to get her MRI brain authorized with insurance.  I was just updated this being completed. I called central scheduled and was given a time and date for MRI brain.  I called patient to update on appt for MRI brain and she verbalized understanding of appt.   Telephone Outgoing Call  [facility] Date 01/11/2019  Patient Visit Type MedOnc  Treatment Phase Abnormal Scans  Barriers/Navigation Needs Coordination of Care;Education  Education Other  Interventions Coordination of Care;Education  Coordination of Care Appts;Other  Education Method Verbal  Acuity Level 3  Time Spent with Patient 67

## 2019-01-11 NOTE — Progress Notes (Signed)
The proposed treatment discussed in cancer conference 9/10 is for discussion purpose only and is not a binding recommendation.  The patient was not physically examined nor present for their treatment options.  Therefore, final treatment plans cannot be decided.

## 2019-01-11 NOTE — Progress Notes (Signed)
[place] Telephone:([phone]) [phone]   Fax:([phone] Multidisciplinary thoracic oncology clinic  CONSULT NOTE  REFERRING PHYSICIAN: Dr. [name]  REASON FOR CONSULTATION:  73 years old white female with suspicious lung cancer.  HPI Stacy [name] is a 73 y.o. female with past medical history significant for hypertension, dyslipidemia, degenerative joint disease, bilateral carotid stenosis, osteoporosis, vitamin D deficiency, long history of smoking.  The patient was seen by her primary care physician for routine follow-up visit and because of her long history for smoking and having cough and history of COPD, she underwent CT screening of the chest on 12/11/2018 and it showed right upper lobe mass measuring 2.2 x 3.8 cm with nodules in the central and medial right upper lobe measuring 1.9 and 2.3 cm.  There was obstruction of the right upper lobe bronchi with postobstructive collapse in the medial right upper lobe.  There was also a low right paratracheal lymph node measuring 2.2 cm, AP 1 to 0.8 cm, subcarinal lymph node measuring 1.6 cm.  The patient had a PET scan on 01/03/2019 and that showed peripheral right upper lobe pulmonary mass markedly hypermetabolic with adjacent hypermetabolic satellite nodules and hypermetabolic right hilar and mediastinal lymphadenopathy as well as a small hypermetabolic right cervical lymph nodes.  There was no evidence of hypermetabolic metastatic disease in the abdomen or pelvis. The patient was referred to the multidisciplinary thoracic oncology clinic today for evaluation and recommendation regarding treatment of her condition. When seen today the patient is feeling fine with no concerning complaints except for fatigue and shortness of breath with exertion as well as chest tightness and mild cough but no hemoptysis.  She denied having any recent weight loss or night sweats.  She has no nausea, vomiting, diarrhea or constipation.  She  denied having any headache or visual changes. Family history significant for mother and father with hypertension and coronary artery disease.  Mother had lung cancer and father had throat cancer. The patient is divorced and has 3 children 2 daughters and 1 son.  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 The patient has a history for smoking 1 pack/day for around 44 years and unfortunately she continues to smoke.  She has no history of alcohol or drug abuse.  HPI  Past Medical History:  Diagnosis Date  . Carotid bruit    Bilateral  . Colon polyps    with divertirticulitis  . DJD (degenerative joint disease)   . Hyperlipidemia   . Hypertension   . Osteopenia   . Prediabetes   . Vitamin D deficiency     Past Surgical History:  Procedure Laterality Date  . CATARACT EXTRACTION Right 03/17/2017  . HAND SURGERY    . head injury  1995  . LAMINECTOMY  2002   L 5   . NECK SURGERY  2012    c4 -5/6  fushion  . TRIGGER FINGER RELEASE      Family History  Problem Relation Age of Onset  . Hyperlipidemia Mother   . Heart disease Mother        7 way bypass/ value replacement/pacemaker  . Heart attack Mother 62  . Diabetes Mother   . Cancer - Other Mother        lung  . Hypertension Father   . Heart attack Father 24  . Cancer - Other Father        throat    Social  History Social History   Tobacco Use  . Smoking status: Current Every Day Smoker  . Smokeless tobacco: Never Used  . Tobacco comment: smokes 2 cigarettes per day  Substance Use Topics  . Alcohol use: No  . Drug use: Not on file    Allergies  Allergen Reactions  . Dilaudid [Hydromorphone Hcl] Shortness Of Breath and Swelling  . Fentanyl Shortness Of Breath and Swelling  . Penicillins Anaphylaxis  . Crestor [Rosuvastatin] Other (See Comments)    Joint pain, cramping     Current Outpatient Medications  Medication Sig Dispense Refill  . albuterol (PROAIR HFA) 108 (90  Base) MCG/ACT inhaler Inhale 2 puffs into the lungs every 6 (six) hours as needed for wheezing or shortness of breath. 18 g 1  . alendronate (FOSAMAX) 70 MG tablet Take 1 tablet every 7 days on an empty stomach with only water for 30 minutes 13 tablet 3  . ALPRAZolam (XANAX) 0.5 MG tablet TAKE 1/2 TO 1 (ONE-HALF TO ONE) TABLET BY MOUTH TWICE DAILY AS NEEDED FOR ANXIETY OR  SLEEP 60 tablet 2  . aspirin 81 MG tablet Take 162 mg by mouth daily.     Marland Kitchen atenolol (TENORMIN) 25 MG tablet Take 1 tablet Daily for BP 90 tablet 3  . Calcium Carbonate-Vitamin D (CALCIUM-VITAMIN D) 500-200 MG-UNIT per tablet Take 2 tablets by mouth daily.     . cholecalciferol (VITAMIN D) 1000 units tablet Take 5,000 Units by mouth daily.    Marland Kitchen ezetimibe (ZETIA) 10 MG tablet TAKE 1 TABLET BY MOUTH ONCE DAILY FOR CHOLESTEROL 90 tablet 0  . vitamin C (ASCORBIC ACID) 500 MG tablet Take 1,000 mg by mouth daily.      No current facility-administered medications for this visit.     Review of Systems  Constitutional: negative Eyes: negative Ears, nose, mouth, throat, and face: negative Respiratory: positive for cough, dyspnea on exertion and pleurisy/chest pain Cardiovascular: negative Gastrointestinal: negative Genitourinary:negative Integument/breast: negative Hematologic/lymphatic: negative Musculoskeletal:negative Neurological: negative Behavioral/Psych: negative Endocrine: negative Allergic/Immunologic: negative  Physical Exam  OIZ:TIWPY, healthy, no distress, well nourished and well developed SKIN: skin color, texture, turgor are normal, no rashes or significant lesions HEAD: Normocephalic, No masses, lesions, tenderness or abnormalities EYES: normal, PERRLA, Conjunctiva are pink and non-injected EARS: External ears normal, Canals clear OROPHARYNX:no exudate, no erythema and lips, buccal mucosa, and tongue normal  NECK: supple, no adenopathy, no JVD LYMPH:  no palpable lymphadenopathy, no hepatosplenomegaly  BREAST:not examined LUNGS: clear to auscultation , and palpation HEART: regular rate & rhythm, no murmurs and no gallops ABDOMEN:abdomen soft, non-tender, normal bowel sounds and no masses or organomegaly BACK: Back symmetric, no curvature., No CVA tenderness EXTREMITIES:no joint deformities, effusion, or inflammation, no edema  NEURO: alert & oriented x 3 with fluent speech, no focal motor/sensory deficits  PERFORMANCE STATUS: ECOG 1  LABORATORY DATA: Lab Results  Component Value Date   WBC 7.8 01/11/2019   HGB 14.3 01/11/2019   HCT 42.3 01/11/2019   MCV 97.9 01/11/2019   PLT 247 01/11/2019      Chemistry      Component Value Date/Time   NA 136 11/27/2018 1522   K 4.3 11/27/2018 1522   CL 100 11/27/2018 1522   CO2 25 11/27/2018 1522   BUN 9 11/27/2018 1522   CREATININE 0.80 11/27/2018 1522      Component Value Date/Time   CALCIUM 9.8 11/27/2018 1522   ALKPHOS 91 01/20/2018 1848   AST 17 11/27/2018 1522   ALT 8 11/27/2018  1522   BILITOT 0.4 11/27/2018 1522       RADIOGRAPHIC STUDIES: Nm Pet Image Initial (pi) Skull Base To Thigh  Result Date: 01/03/2019 CLINICAL DATA:  Initial treatment strategy for non-small-cell lung cancer. EXAM: NUCLEAR MEDICINE PET SKULL BASE TO THIGH TECHNIQUE: 6.0 mCi F-18 FDG was injected intravenously. Full-ring PET imaging was performed from the skull base to thigh after the radiotracer. CT data was obtained and used for attenuation correction and anatomic localization. Fasting blood glucose: 122 mg/dl COMPARISON:  Lung cancer screening chest CT 12/11/2018 FINDINGS: Mediastinal blood pool activity: SUV max 2.5 Liver activity: SUV max NA NECK: Small hypermetabolic right level II lymph node measures 7 mm short axis (image 26/series 4) with SUV max = 3.6. No other hypermetabolic lymphadenopathy in the neck. Incidental CT findings: None. CHEST: 8 mm short axis high right paratracheal node (33/2) is hypermetabolic. Bulky 2.8 cm short axis precarinal  lymph node is hypermetabolic with SUV max = 95.1. Conglomerate adenopathy in the right hilum is markedly hypermetabolic with SUV max = 88.4. The peripheral right upper lobe lung mass measured at 2.2 x 3.8 cm on previous lung cancer screening CT is hypermetabolic with SUV max = 16.6. 13 mm right upper lobe nodule between the dominant peripheral mass and the hilum is hypermetabolic with SUV max = 06.3 No left hilar hypermetabolic metastases. No evidence for hypermetabolic lymphadenopathy in the hilar regions. Incidental CT findings: noneAtherosclerotic calcification is noted in the wall of the thoracic aorta. Coronary artery calcification is evident. Centrilobular and paraseptal emphysema evident. No evidence of pleural effusion. ABDOMEN/PELVIS: No abnormal hypermetabolic activity within the liver, pancreas, adrenal glands, or spleen. No hypermetabolic lymph nodes in the abdomen or pelvis. Incidental CT findings: Abdominal aortic atherosclerosis noted with fusiform distention of the infrarenal abdominal aorta up to 2.5 cm maximum diameter. SKELETON: No focal hypermetabolic activity to suggest skeletal metastasis. Incidental CT findings: none IMPRESSION: 1. Peripheral right upper lobe pulmonary mass is markedly hypermetabolic with adjacent hypermetabolic satellite nodules and hypermetabolic right hilar and mediastinal lymphadenopathy. Small right cervical lymph node demonstrates hypermetabolism, also suspicious for metastatic involvement. 2. No evidence for hypermetabolic metastatic disease in the abdomen or pelvis. 3.  Aortic Atherosclerois (ICD10-170.0) 4.  Emphysema. ([id number]) Electronically Signed   By: Misty Stanley M.D.   On: 01/03/2019 14:19    ASSESSMENT: This is a very pleasant 73 years old white female with highly suspicious stage IIIb (T3, N3, M0) lung cancer pending tissue diagnosis and further staging work-up.  She presented with right upper lobe lung mass in addition to satellite nodules as well as  right hilar, mediastinal and right supraclavicular lymphadenopathy.   PLAN: I had a lengthy discussion with the patient today about her current condition and further investigation to confirm her diagnosis and treatment options. I personally and independently reviewed the scan images and discussed the result and showed the images to the patient today. I recommended for the patient to complete the staging work-up with MRI of the brain to rule out brain metastasis. We will also arrange for the patient to see Dr. Roxan Hockey later today for evaluation and consideration of bronchoscopy with endobronchial ultrasound and biopsy for confirmation of tissue diagnosis. I briefly discussed with the patient the treatment options and if the final pathology is consistent with non-small cell lung cancer, we will consider her for a course of concurrent chemoradiation with weekly carboplatin and paclitaxel.  This will be followed by consolidation immunotherapy. The patient will see radiation oncology, Dr. Lisbeth Renshaw later  today for discussion of the radiotherapy option. I will arrange for the patient to come back for follow-up visit in less than 2 weeks for more detailed discussion of her treatment options after completion of the staging work-up and biopsy results. For smoking cessation, I strongly encouraged the patient to quit smoking. She was advised to call immediately if she has any concerning symptoms in the interval. The patient voices understanding of current disease status and treatment options and is in agreement with the current care plan.  All questions were answered. The patient knows to call the clinic with any problems, questions or concerns. We can certainly see the patient much sooner if necessary.  Thank you so much for allowing me to participate in the care of [name]. I will continue to follow up the patient with you and assist in her care.  I spent 40 minutes counseling the patient face to  face. The total time spent in the appointment was 60 minutes.  Disclaimer: This note was dictated with voice recognition software. Similar sounding words can inadvertently be transcribed and may not be corrected upon review.   Eilleen Kempf January 11, 2019, 1:38 PM

## 2019-01-11 NOTE — Progress Notes (Signed)
Radiation Oncology         [phone]) [phone] ________________________________  Name: Stacy [name]        MRN: [id number]  Date of Service: 01/11/2019 DOB: February 01, 1946  CC:[name], MD  Stacy Mutters, PA-C     REFERRING PHYSICIAN: Vicie Mutters, PA-C   DIAGNOSIS: Diagnoses of Malignant neoplasm of upper lobe of right lung ([place]) and Malignant neoplasm of unspecified part of unspecified bronchus or lung ([place]) were pertinent to this visit.   HISTORY OF PRESENT ILLNESS: Stacy [name] is a 73 y.o. female seen at the request of Stacy [name] for a probable stage III lung cancer. The patient is a current smoker and was counseled on the rationale for a lung cancer screening CT scan. This was performed on 12/11/2018, and the patient was found to have a 2.2 x 3.8 cm RUL mass with adjacent nodules measuring 1.8 cm and 2.3 cm. There is obstructive change in the RUL bronchi with postobstructive collapse in the medial right upper lobe, and a 6.2 [name] subpleural nodule in the LLL. She had a 2.2 cm low right paratracheal node, an 8 [name] AP window lymph node, subcarinal node measuring 1.6 cm, and right hilar adenopathy that could not be measured due to lack of IV contrast. She underwent a PET scan on 01/03/2019 revealing hypermetabolic changes in the right upper lobe mass with an SUV of 16.7, and bulky hypermetabolic precarinal and right hilar adenopathy. Her right paratracheal node was also hypermetabolic. No metastatic disease was noted. She is seen today in multidisciplinary thoracic oncology conference to discuss recommendations of treatment for her presumed lung cancer.     PREVIOUS RADIATION THERAPY: No   PAST MEDICAL HISTORY:  Past Medical History:  Diagnosis Date  . Carotid bruit    Bilateral  . Colon polyps    with divertirticulitis  . DJD (degenerative joint disease)   . Hyperlipidemia   . Hypertension   . Osteopenia   . Prediabetes   . Vitamin D deficiency        PAST SURGICAL  HISTORY: Past Surgical History:  Procedure Laterality Date  . CATARACT EXTRACTION Right 03/17/2017  . HAND SURGERY    . head injury  1995  . LAMINECTOMY  2002   L 5   . NECK SURGERY  2012    c4 -5/6  fushion  . TRIGGER FINGER RELEASE       FAMILY HISTORY:  Family History  Problem Relation Age of Onset  . Hyperlipidemia Mother   . Heart disease Mother        7 way bypass/ value replacement/pacemaker  . Heart attack Mother 76  . Diabetes Mother   . Cancer - Other Mother        lung  . Hypertension Father   . Heart attack Father 22  . Cancer - Other Father        throat     SOCIAL HISTORY:  reports that she has been smoking. She has never used smokeless tobacco. She reports that she does not drink alcohol. The patient is divorced and lives in [place]. She's a retired Quarry manager. Her daughter is also a Marine scientist.   ALLERGIES: Dilaudid [hydromorphone hcl], Fentanyl, Penicillins, and Crestor [rosuvastatin]   MEDICATIONS:  Current Outpatient Medications  Medication Sig Dispense Refill  . albuterol (PROAIR HFA) 108 (90 Base) MCG/ACT inhaler Inhale 2 puffs into the lungs every 6 (six) hours as needed for wheezing or shortness of breath. (Patient not taking: Reported on 01/11/2019)  18 g 1  . ALPRAZolam (XANAX) 0.5 MG tablet TAKE 1/2 TO 1 (ONE-HALF TO ONE) TABLET BY MOUTH TWICE DAILY AS NEEDED FOR ANXIETY OR  SLEEP 60 tablet 2  . aspirin 81 MG tablet Take 162 mg by mouth daily.     Marland Kitchen atenolol (TENORMIN) 25 MG tablet Take 1 tablet Daily for BP 90 tablet 3  . cholecalciferol (VITAMIN D) 1000 units tablet Take 5,000 Units by mouth daily.    . diphenhydramine-acetaminophen (TYLENOL PM) 25-500 MG TABS tablet Take 1 tablet by mouth at bedtime as needed.    . ezetimibe (ZETIA) 10 MG tablet TAKE 1 TABLET BY MOUTH ONCE DAILY FOR CHOLESTEROL 90 tablet 0  . vitamin C (ASCORBIC ACID) 500 MG tablet Take 1,000 mg by mouth daily.      No current facility-administered medications for this  encounter.      REVIEW OF SYSTEMS: On review of systems, the patient reports that she is doing well overall. She has had occasional cough that is productive with sputum. And she denies any hemoptysis. She denies any chest pain, shortness of breath, cough, fevers, chills, night sweats, unintended weight changes. She denies any bowel or bladder disturbances, and denies abdominal pain, nausea or vomiting. She denies any new musculoskeletal or joint aches or pains. A complete review of systems is obtained and is otherwise negative.     PHYSICAL EXAM:  Wt Readings from Last 3 Encounters:  01/11/19 116 lb 3.2 oz (52.7 kg)  11/27/18 120 lb (54.4 kg)  09/05/18 117 lb (53.1 kg)   Temp Readings from Last 3 Encounters:  01/11/19 97.8 F (36.6 C)  11/27/18 97.7 F (36.5 C)  09/05/18 98.5 F (36.9 C) (Oral)   BP Readings from Last 3 Encounters:  01/11/19 (!) 134/91  11/27/18 122/82  09/05/18 137/89   Pulse Readings from Last 3 Encounters:  01/11/19 75  11/27/18 64  09/05/18 76   In general this is a well appearing caucasian female in no acute distress. She's alert and oriented x4 and appropriate throughout the examination. Cardiopulmonary assessment is negative for acute distress and she exhibits normal effort.    ECOG = 1  0 - Asymptomatic (Fully active, able to carry on all predisease activities without restriction)  1 - Symptomatic but completely ambulatory (Restricted in physically strenuous activity but ambulatory and able to carry out work of a light or sedentary nature. For example, light housework, office work)  2 - Symptomatic, <50% in bed during the day (Ambulatory and capable of all self care but unable to carry out any work activities. Up and about more than 50% of waking hours)  3 - Symptomatic, >50% in bed, but not bedbound (Capable of only limited self-care, confined to bed or chair 50% or more of waking hours)  4 - Bedbound (Completely disabled. Cannot carry on any  self-care. Totally confined to bed or chair)  5 - Death   Stacy [name], Creech RH, Tormey DC, et al. [phone]). "Toxicity and response criteria of the [facility]". [place] Oncol. 5 (6): 649-55    LABORATORY DATA:  Lab Results  Component Value Date   WBC 7.8 01/11/2019   HGB 14.3 01/11/2019   HCT 42.3 01/11/2019   MCV 97.9 01/11/2019   PLT 247 01/11/2019   Lab Results  Component Value Date   NA 137 01/11/2019   K 4.4 01/11/2019   CL 103 01/11/2019   CO2 27 01/11/2019   Lab Results  Component Value  Date   ALT 8 01/11/2019   AST 16 01/11/2019   ALKPHOS 90 01/11/2019   BILITOT <0.2 (L) 01/11/2019      RADIOGRAPHY: Nm Pet Image Initial (pi) Skull Base To Thigh  Result Date: 01/03/2019 CLINICAL DATA:  Initial treatment strategy for non-small-cell lung cancer. EXAM: NUCLEAR MEDICINE PET SKULL BASE TO THIGH TECHNIQUE: 6.0 mCi F-18 FDG was injected intravenously. Full-ring PET imaging was performed from the skull base to thigh after the radiotracer. CT data was obtained and used for attenuation correction and anatomic localization. Fasting blood glucose: 122 mg/dl COMPARISON:  Lung cancer screening chest CT 12/11/2018 FINDINGS: Mediastinal blood pool activity: SUV max 2.5 Liver activity: SUV max NA NECK: Small hypermetabolic right level II lymph node measures 7 [name] short axis (image 26/series 4) with SUV max = 3.6. No other hypermetabolic lymphadenopathy in the neck. Incidental CT findings: None. CHEST: 8 [name] short axis high right paratracheal node (92/3) is hypermetabolic. Bulky 2.8 cm short axis precarinal lymph node is hypermetabolic with SUV max = 30.0. Conglomerate adenopathy in the right hilum is markedly hypermetabolic with SUV max = 76.2. The peripheral right upper lobe lung mass measured at 2.2 x 3.8 cm on previous lung cancer screening CT is hypermetabolic with SUV max = 26.3. 13 [name] right upper lobe nodule between the dominant peripheral mass and the hilum is  hypermetabolic with SUV max = 33.5 No left hilar hypermetabolic metastases. No evidence for hypermetabolic lymphadenopathy in the hilar regions. Incidental CT findings: noneAtherosclerotic calcification is noted in the wall of the thoracic aorta. Coronary artery calcification is evident. Centrilobular and paraseptal emphysema evident. No evidence of pleural effusion. ABDOMEN/PELVIS: No abnormal hypermetabolic activity within the liver, pancreas, adrenal glands, or spleen. No hypermetabolic lymph nodes in the abdomen or pelvis. Incidental CT findings: Abdominal aortic atherosclerosis noted with fusiform distention of the infrarenal abdominal aorta up to 2.5 cm maximum diameter. SKELETON: No focal hypermetabolic activity to suggest skeletal metastasis. Incidental CT findings: none IMPRESSION: 1. Peripheral right upper lobe pulmonary mass is markedly hypermetabolic with adjacent hypermetabolic satellite nodules and hypermetabolic right hilar and mediastinal lymphadenopathy. Small right cervical lymph node demonstrates hypermetabolism, also suspicious for metastatic involvement. 2. No evidence for hypermetabolic metastatic disease in the abdomen or pelvis. 3.  Aortic Atherosclerois (ICD10-170.0) 4.  Emphysema. ([id number]-W38.9) Electronically Signed   By: Misty Stanley M.D.   On: 01/03/2019 14:19       IMPRESSION/PLAN: 1. At least Stage IIIA, cT2aN2Mx NSCLC of the RUL. Dr. Lisbeth Renshaw discusses the imaging findings and reviewed the rationale to proceed with biopsy of the RUL as she most likely has a lung cancer based on her current imaging findings. She should also undergo an MRI of the brain to rule out metastatic disease. If she does not have metastatic disease, she would benefit from chemoRT.  We discussed the risks, benefits, short, and long term effects of radiotherapy. Dr. Lisbeth Renshaw discusses the delivery and logistics of radiotherapy and anticipates a course of 6 1/2 weeks of radiotherapy. We will follow up with the  results of her continued work up and anticipate simulation following MRI and tissue diagnosis. She would like to meet back after her workup prior to making any definitve treatment plans.  In a visit lasting 45 minutes, greater than 50% of the time was spent face to face discussing her case, and coordinating the patient's care.   The above documentation reflects my direct findings during this shared patient visit. Please see the separate note by Dr. Lisbeth Renshaw  on this date for the remainder of the patient's plan of care.    Carola Rhine, PAC

## 2019-01-11 NOTE — Progress Notes (Signed)
PCP is [name], MD Referring Provider is Vicie Mutters, PA-C  No chief complaint on file.   HPI: 73 yo woman with new right upper lobe lung mass with mediastinal adenopathy.  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 She recently had an annual wellness visit. She noted cough and shortness of breath with activity. A LDCT of the chest was done which showed a right upper lobe mass and right hilar and mediastinal adenopathy. There also was coronary and aortic atherosclerosis, COPD and probable pulmonary fibrosis.  She has a chronic cough and baseline shortness of breath with activity. No recent change. She also has occasional wheezing. No hemoptysis, headaches, visual changes, bone pain, change in appetite or weight loss. Has history of "dysrhythmias", no chest pain, tightness or pressure recently.  Zubrod Score: At the time of surgery this patient's most appropriate activity status/level should be described as: []     0    Normal activity, no symptoms [x]     1    Restricted in physical strenuous activity but ambulatory, able to do out light work []     2    Ambulatory and capable of self care, unable to do work activities, up and about >50 % of waking hours                              []     3    Only limited self care, in bed greater than 50% of waking hours []     4    Completely disabled, no self care, confined to bed or chair []     5    Moribund  Past Medical History:  Diagnosis Date  . Carotid bruit    Bilateral  . Colon polyps    with divertirticulitis  . DJD (degenerative joint disease)   . Hyperlipidemia   . Hypertension   . Osteopenia   . Prediabetes   . Vitamin D deficiency     Past Surgical History:  Procedure Laterality Date  . CATARACT EXTRACTION Right 03/17/2017  . HAND SURGERY    . head injury  1995  . LAMINECTOMY  2002   L 5   . NECK  SURGERY  2012    c4 -5/6  fushion  . TRIGGER FINGER RELEASE      Family History  Problem Relation Age of Onset  . Hyperlipidemia Mother   . Heart disease Mother        7 way bypass/ value replacement/pacemaker  . Heart attack Mother 4  . Diabetes Mother   . Cancer - Other Mother        lung  . Hypertension Father   . Heart attack Father 74  . Cancer - Other Father        throat    Social History Social History   Tobacco Use  . Smoking status: Current Every Day Smoker  . Smokeless tobacco: Never Used  . Tobacco comment: smokes 2 cigarettes per day  Substance Use Topics  . Alcohol use: No  . Drug use: Not on file    Current Outpatient Medications  Medication Sig Dispense Refill  . albuterol (PROAIR HFA) 108 (90 Base) MCG/ACT inhaler Inhale 2 puffs into the lungs every 6 (six) hours as needed for wheezing or shortness of breath. (Patient not taking: Reported on 01/11/2019) 18 g 1  .  ALPRAZolam (XANAX) 0.5 MG tablet TAKE 1/2 TO 1 (ONE-HALF TO ONE) TABLET BY MOUTH TWICE DAILY AS NEEDED FOR ANXIETY OR  SLEEP 60 tablet 2  . aspirin 81 MG tablet Take 162 mg by mouth daily.     Marland Kitchen atenolol (TENORMIN) 25 MG tablet Take 1 tablet Daily for BP 90 tablet 3  . cholecalciferol (VITAMIN D) 1000 units tablet Take 5,000 Units by mouth daily.    . diphenhydramine-acetaminophen (TYLENOL PM) 25-500 MG TABS tablet Take 1 tablet by mouth at bedtime as needed.    . ezetimibe (ZETIA) 10 MG tablet TAKE 1 TABLET BY MOUTH ONCE DAILY FOR CHOLESTEROL 90 tablet 0  . vitamin C (ASCORBIC ACID) 500 MG tablet Take 1,000 mg by mouth daily.      No current facility-administered medications for this visit.     Allergies  Allergen Reactions  . Dilaudid [Hydromorphone Hcl] Shortness Of Breath and Swelling  . Fentanyl Shortness Of Breath and Swelling  . Penicillins Anaphylaxis  . Crestor [Rosuvastatin] Other (See Comments)    Joint pain, cramping     Review of Systems  There were no vitals taken for  this visit. Physical Exam   Diagnostic Tests: CT CHEST WITHOUT CONTRAST LOW-DOSE FOR LUNG CANCER SCREENING  TECHNIQUE: Multidetector CT imaging of the chest was performed following the standard protocol without IV contrast.  COMPARISON:  None.  FINDINGS: Cardiovascular: Atherosclerotic calcification of the aorta, aortic valve and coronary arteries. Heart size normal. No pericardial effusion.  Mediastinum/Nodes: Low right paratracheal lymph node measures 2.2 cm. AP window lymph nodes measure up to 8 mm. Subcarinal lymph node measures 1.6 cm. There is right hilar adenopathy but it is difficult to measure without IV contrast. No axillary adenopathy. Esophagus is grossly unremarkable.  Lungs/Pleura: Centrilobular and paraseptal emphysema. Superimposed interstitial coarsening and subpleural reticulation. Peripheral area of nodular consolidation in the apical right upper lobe has a volume derived mean of at least 2.7 cm. 2 dimensional measurements are 2.2 x 3.8 cm (11/57). Nodules in the central and medial right upper lobe measure 1.9 cm (11/73) and 2.3 cm (11/85). There is obstruction of right upper lobe bronchi with postobstructive collapse in the medial right upper lobe. 6.2 mm subpleural nodule in the left lower lobe (11/138). No pleural fluid.  Upper Abdomen: Visualized portions of the liver, adrenal glands, spleen, pancreas, stomach and bowel are grossly unremarkable.  Musculoskeletal: Degenerative changes in the spine. No worrisome lytic or sclerotic lesions.  IMPRESSION: 1. Right upper lobe mass with adjacent right upper lobe nodules and mediastinal/right hilar adenopathy, findings most consistent with primary bronchogenic carcinoma. Lung-RADS 4B, suspicious. Additional imaging evaluation or consultation with Pulmonology or Thoracic Surgery recommended. These results will be called to the ordering clinician or representative by the Radiologist Assistant,  and communication documented in the PACS or zVision Dashboard. 2. Associated obstruction of right upper lobe bronchi with volume loss in the medial aspect of the right upper lobe. 3. Aortic atherosclerosis (ICD10-170.0). Coronary artery calcification. 4.  Emphysema (ICD10-J43.9).   Electronically Signed   By: Lorin Picket M.D.   On: 12/11/2018 14:22 NUCLEAR MEDICINE PET SKULL BASE TO THIGH  TECHNIQUE: 6.0 mCi F-18 FDG was injected intravenously. Full-ring PET imaging was performed from the skull base to thigh after the radiotracer. CT data was obtained and used for attenuation correction and anatomic localization.  Fasting blood glucose: 122 mg/dl  COMPARISON:  Lung cancer screening chest CT 12/11/2018  FINDINGS: Mediastinal blood pool activity: SUV max 2.5  Liver activity:  SUV max NA  NECK: Small hypermetabolic right level II lymph node measures 7 mm short axis (image 26/series 4) with SUV max = 3.6. No other hypermetabolic lymphadenopathy in the neck.  Incidental CT findings: None.  CHEST: 8 mm short axis high right paratracheal node (82/5) is hypermetabolic. Bulky 2.8 cm short axis precarinal lymph node is hypermetabolic with SUV max = 05.3. Conglomerate adenopathy in the right hilum is markedly hypermetabolic with SUV max = 97.6.  The peripheral right upper lobe lung mass measured at 2.2 x 3.8 cm on previous lung cancer screening CT is hypermetabolic with SUV max = 73.4. 13 mm right upper lobe nodule between the dominant peripheral mass and the hilum is hypermetabolic with SUV max = 19.3  No left hilar hypermetabolic metastases. No evidence for hypermetabolic lymphadenopathy in the hilar regions.  Incidental CT findings: noneAtherosclerotic calcification is noted in the wall of the thoracic aorta. Coronary artery calcification is evident. Centrilobular and paraseptal emphysema evident. No evidence of pleural effusion.  ABDOMEN/PELVIS: No  abnormal hypermetabolic activity within the liver, pancreas, adrenal glands, or spleen. No hypermetabolic lymph nodes in the abdomen or pelvis.  Incidental CT findings: Abdominal aortic atherosclerosis noted with fusiform distention of the infrarenal abdominal aorta up to 2.5 cm maximum diameter.  SKELETON: No focal hypermetabolic activity to suggest skeletal metastasis.  Incidental CT findings: none  IMPRESSION: 1. Peripheral right upper lobe pulmonary mass is markedly hypermetabolic with adjacent hypermetabolic satellite nodules and hypermetabolic right hilar and mediastinal lymphadenopathy. Small right cervical lymph node demonstrates hypermetabolism, also suspicious for metastatic involvement. 2. No evidence for hypermetabolic metastatic disease in the abdomen or pelvis. 3.  Aortic Atherosclerois (ICD10-170.0) 4.  Emphysema. ([id number]-O97.9)   Electronically Signed   By: Misty Stanley M.D.   On: 01/03/2019 14:19 I personally reviewed the CT and PET images and concur with the findings noted above   Impression: Ms. Dizon is a 73 yo woman with a history of tobacco abuse, COPD, ILD/ Pulmonary fibrosis, hypertension, hyperlipidemia, coronary and thoracic aortic atherosclerosis. She recently had a low dose screening CT for lung cancer which revealed a right upper lobe mass with hilar and mediastinal adenopathy. PET showed these areas were hypermetabolic. She has at least stage IIIA disease (unresectable). Questionable cervical node which would be stage IV. This is most likely non-small cell carcinoma, but small cell is also a possibility.  She needs a tissue diagnosis to confirm diagnosis and guide therapy.  We discussed options of CT guided v bronchoscopic biopsy. I informed her of the advantages and disadvantages of each approach. She prefers bronchoscopy.  I informed her of the general nature of bronchoscopy and EBUS. She understands the need for general anesthesia. We  will plan to do the procedure on an outpatient basis. I informed her of the indications, risks, benefits and alternatives. She understands the risks include those associated with general anesthesia. She understands the risks include but are not limited to death, MI, DVT, PE, bleeding, pneumothorax as well as the possibility of other unforeseeable complications. She understands there is no guarantee of a definitive diagnosis.  She accepts the risks and agrees to proceed.  Thoracic aortic and coronary atherosclerosis- asymptomatic  Plan: Bronch, EBUS next week My office will contact patient to schedule  Melrose Nakayama, MD [facility] [phone]

## 2019-01-15 ENCOUNTER — Ambulatory Visit (HOSPITAL_COMMUNITY)
Admission: RE | Admit: 2019-01-15 | Discharge: 2019-01-15 | Disposition: A | Discharge status: Home / Self Care | Payer: Medicare HMO | Source: Ambulatory Visit | Attending: Radiation Oncology | Admitting: Radiation Oncology

## 2019-01-15 ENCOUNTER — Telehealth: Payer: Self-pay | Admitting: Internal Medicine

## 2019-01-15 ENCOUNTER — Other Ambulatory Visit: Payer: Self-pay

## 2019-01-15 DIAGNOSIS — C3490 Malignant neoplasm of unspecified part of unspecified bronchus or lung: Secondary | ICD-10-CM

## 2019-01-15 MED ORDER — GADOBUTROL 1 MMOL/ML IV SOLN
6.0000 mL | Freq: Once | INTRAVENOUS | Status: AC | PRN
  Administered 2019-01-15: 14:00:00 6 mL via INTRAVENOUS

## 2019-01-15 NOTE — Telephone Encounter (Signed)
Spoke with patient re lab/fu 9/22.

## 2019-01-16 ENCOUNTER — Telehealth: Payer: Self-pay | Admitting: Radiation Oncology

## 2019-01-16 ENCOUNTER — Other Ambulatory Visit (HOSPITAL_COMMUNITY): Payer: Medicare HMO

## 2019-01-16 ENCOUNTER — Encounter: Payer: Self-pay | Admitting: *Deleted

## 2019-01-16 ENCOUNTER — Other Ambulatory Visit: Payer: Self-pay | Admitting: *Deleted

## 2019-01-16 DIAGNOSIS — R590 Localized enlarged lymph nodes: Secondary | ICD-10-CM

## 2019-01-16 DIAGNOSIS — R918 Other nonspecific abnormal finding of lung field: Secondary | ICD-10-CM

## 2019-01-16 NOTE — Progress Notes (Signed)
Oncology Nurse Navigator Documentation  Oncology Nurse Navigator Flowsheets 01/16/2019  Navigator Location [facility]  Referral Date to RadOnc/MedOnc -  Navigator Encounter Type Other/I received a message from [facility] that they tried to schedule but she is declining at this time.  Patient has also not been scheduled for bronchoscopy so I reached out to Dr. Leonarda Salon office to check on this.    Telephone -  [facility] Date -  Patient Visit Type -  Treatment Phase Abnormal Scans  Barriers/Navigation Needs Coordination of Care  Education -  Interventions Coordination of Care  Coordination of Care Other  Education Method -  Acuity Level 2  Time Spent with Patient 15

## 2019-01-16 NOTE — Telephone Encounter (Signed)
I called the patient to let her know her MRI results. She has not heard from Dr. Leonarda Salon scheduler yet about bronch plans for this week. She would like to hold off on making any follow up appts with Dr. Lisbeth Renshaw until after her biopsy and meeting with Dr. Julien Nordmann.

## 2019-01-17 ENCOUNTER — Other Ambulatory Visit (HOSPITAL_COMMUNITY): Payer: Medicare HMO

## 2019-01-17 ENCOUNTER — Encounter: Payer: Self-pay | Admitting: *Deleted

## 2019-01-18 ENCOUNTER — Telehealth: Payer: Self-pay | Admitting: Internal Medicine

## 2019-01-18 ENCOUNTER — Telehealth: Payer: Self-pay | Admitting: *Deleted

## 2019-01-18 NOTE — Telephone Encounter (Signed)
Received call from pt requesting to change her upcoming appt with Dr. Julien Nordmann to 9/28 or 9/29 as her bronchosopy is scheduled on 9/24. He previous appt with Dr. Julien Nordmann was on 01/23/19.  Scheduling message sent

## 2019-01-18 NOTE — Telephone Encounter (Signed)
R/s appt per 9/17 sch message - pt aware of appt date and time

## 2019-01-19 NOTE — Pre-Procedure Instructions (Signed)
[facility] [address], [place] [address] Phone: [phone] Fax: [phone]      Your procedure is scheduled on  01-25-19 from 1118 AM-1244 PM  Report to [facility] Main Entrance "A" at [place] and check in at the Admitting office.  Call this number if you have problems the morning of surgery:  [phone]  Call [phone] if you have any questions prior to your surgery date Monday-Friday 8am-4pm   Remember:  Do not eat or drink after midnight the night before your surgery   Take these medicines the morning of surgery with A SIP OF WATER : ezetimibe (ZETIA) atenolol (TENORMIN)  albuterol (PROAIR HFA) as needed  Follow your surgeon's instructions on when to stop Aspirin.  If no instructions were given by your surgeon then you will need to call the office to get those instructions.    7 days prior to surgery STOP taking any Aspirin (unless otherwise instructed by your surgeon), Aleve, Naproxen, Ibuprofen, Motrin, Advil, Goody's, BC's, all herbal medications, fish oil, and all vitamins.    The Morning of Surgery  Do not wear jewelry, make-up or nail polish.  Do not wear lotions, powders, or perfumes/colognes, or deodorant  Do not shave 48 hours prior to surgery.    Do not bring valuables to the hospital.  [facility] is not responsible for any belongings or valuables.  If you are a smoker, DO NOT Smoke 24 hours prior to surgery IF you wear a CPAP at night please bring your mask, tubing, and machine the morning of surgery   Remember that you must have someone to transport you home after your surgery, and remain with you for 24 hours if you are discharged the same day.   Contacts, glasses, hearing aids, dentures or bridgework may not be worn into surgery.    Leave your suitcase in the car.  After surgery it may be brought to your room.  For patients admitted to the hospital, discharge time  will be determined by your treatment team.  Patients discharged the day of surgery will not be allowed to drive home.    Special instructions:   [facility]- Preparing For Surgery  Before surgery, you can play an important role. Because skin is not sterile, your skin needs to be as free of germs as possible. You can reduce the number of germs on your skin by washing with CHG (chlorahexidine gluconate) Soap before surgery.  CHG is an antiseptic cleaner which kills germs and bonds with the skin to continue killing germs even after washing.    Oral Hygiene is also important to reduce your risk of infection.  Remember - BRUSH YOUR TEETH THE MORNING OF SURGERY WITH YOUR REGULAR TOOTHPASTE  Please do not use if you have an allergy to CHG or antibacterial soaps. If your skin becomes reddened/irritated stop using the CHG.  Do not shave (including legs and underarms) for at least 48 hours prior to first CHG shower. It is OK to shave your face.  Please follow these instructions carefully.   1. Shower the NIGHT BEFORE SURGERY and the MORNING OF SURGERY with CHG Soap.   2. If you chose to wash your hair, wash your hair first as usual with your normal shampoo.  3. After you shampoo, rinse your hair and body thoroughly to remove the shampoo.  4. Use CHG as you would any other liquid soap. You can apply CHG directly  to the skin and wash gently with a scrungie or a clean washcloth.   5. Apply the CHG Soap to your body ONLY FROM THE NECK DOWN.  Do not use on open wounds or open sores. Avoid contact with your eyes, ears, mouth and genitals (private parts). Wash Face and genitals (private parts)  with your normal soap.   6. Wash thoroughly, paying special attention to the area where your surgery will be performed.  7. Thoroughly rinse your body with warm water from the neck down.  8. DO NOT shower/wash with your normal soap after using and rinsing off the CHG Soap.  9. Pat yourself dry with a CLEAN  TOWEL.  10. Wear CLEAN PAJAMAS to bed the night before surgery, wear comfortable clothes the morning of surgery  11. Place CLEAN SHEETS on your bed the night of your first shower and DO NOT SLEEP WITH PETS.    Day of Surgery:  Do not apply any deodorants/lotions. Please shower the morning of surgery with the CHG soap  Please wear clean clothes to the hospital/surgery center.   Remember to brush your teeth WITH YOUR REGULAR TOOTHPASTE.   Please read over the  fact sheets that you were given.

## 2019-01-22 ENCOUNTER — Other Ambulatory Visit: Payer: Self-pay

## 2019-01-22 ENCOUNTER — Other Ambulatory Visit: Payer: Self-pay | Admitting: Physician Assistant

## 2019-01-22 ENCOUNTER — Encounter (HOSPITAL_COMMUNITY)
Admission: RE | Admit: 2019-01-22 | Discharge: 2019-01-22 | Disposition: A | Discharge status: Home / Self Care | Payer: Medicare HMO | Source: Ambulatory Visit | Attending: Thoracic Surgery (Cardiothoracic Vascular Surgery) | Admitting: Thoracic Surgery (Cardiothoracic Vascular Surgery)

## 2019-01-22 ENCOUNTER — Encounter (HOSPITAL_COMMUNITY): Payer: Self-pay

## 2019-01-22 ENCOUNTER — Ambulatory Visit (HOSPITAL_COMMUNITY)
Admission: RE | Admit: 2019-01-22 | Discharge: 2019-01-22 | Disposition: A | Discharge status: Home / Self Care | Payer: Medicare HMO | Source: Ambulatory Visit | Attending: Thoracic Surgery (Cardiothoracic Vascular Surgery) | Admitting: Thoracic Surgery (Cardiothoracic Vascular Surgery)

## 2019-01-22 ENCOUNTER — Other Ambulatory Visit (HOSPITAL_COMMUNITY)
Admission: RE | Admit: 2019-01-22 | Discharge: 2019-01-22 | Disposition: A | Discharge status: Home / Self Care | Payer: Medicare HMO | Source: Ambulatory Visit | Attending: Thoracic Surgery (Cardiothoracic Vascular Surgery) | Admitting: Thoracic Surgery (Cardiothoracic Vascular Surgery)

## 2019-01-22 DIAGNOSIS — Z7982 Long term (current) use of aspirin: Secondary | ICD-10-CM | POA: Diagnosis not present

## 2019-01-22 DIAGNOSIS — R7303 Prediabetes: Secondary | ICD-10-CM | POA: Diagnosis not present

## 2019-01-22 DIAGNOSIS — J439 Emphysema, unspecified: Secondary | ICD-10-CM | POA: Diagnosis not present

## 2019-01-22 DIAGNOSIS — I10 Essential (primary) hypertension: Secondary | ICD-10-CM | POA: Insufficient documentation

## 2019-01-22 DIAGNOSIS — M8580 Other specified disorders of bone density and structure, unspecified site: Secondary | ICD-10-CM | POA: Diagnosis not present

## 2019-01-22 DIAGNOSIS — R918 Other nonspecific abnormal finding of lung field: Secondary | ICD-10-CM | POA: Diagnosis not present

## 2019-01-22 DIAGNOSIS — R590 Localized enlarged lymph nodes: Secondary | ICD-10-CM

## 2019-01-22 DIAGNOSIS — F17210 Nicotine dependence, cigarettes, uncomplicated: Secondary | ICD-10-CM | POA: Diagnosis not present

## 2019-01-22 DIAGNOSIS — I700 Atherosclerosis of aorta: Secondary | ICD-10-CM | POA: Insufficient documentation

## 2019-01-22 DIAGNOSIS — Z79899 Other long term (current) drug therapy: Secondary | ICD-10-CM | POA: Diagnosis not present

## 2019-01-22 DIAGNOSIS — Z01811 Encounter for preprocedural respiratory examination: Secondary | ICD-10-CM | POA: Diagnosis not present

## 2019-01-22 DIAGNOSIS — J849 Interstitial pulmonary disease, unspecified: Secondary | ICD-10-CM | POA: Insufficient documentation

## 2019-01-22 DIAGNOSIS — Z01818 Encounter for other preprocedural examination: Secondary | ICD-10-CM | POA: Insufficient documentation

## 2019-01-22 DIAGNOSIS — Z20828 Contact with and (suspected) exposure to other viral communicable diseases: Secondary | ICD-10-CM | POA: Insufficient documentation

## 2019-01-22 DIAGNOSIS — E785 Hyperlipidemia, unspecified: Secondary | ICD-10-CM | POA: Diagnosis not present

## 2019-01-22 DIAGNOSIS — R079 Chest pain, unspecified: Secondary | ICD-10-CM | POA: Insufficient documentation

## 2019-01-22 HISTORY — DX: Family history of other specified conditions: Z84.89

## 2019-01-22 HISTORY — DX: Chronic obstructive pulmonary disease, unspecified: J44.9

## 2019-01-22 HISTORY — DX: Emphysema, unspecified: J43.9

## 2019-01-22 HISTORY — DX: Chest pain, unspecified: R07.9

## 2019-01-22 LAB — COMPREHENSIVE METABOLIC PANEL
ALT: 10 U/L (ref 0–44)
AST: 20 U/L (ref 15–41)
Albumin: 4.1 g/dL (ref 3.5–5.0)
Alkaline Phosphatase: 81 U/L (ref 38–126)
Anion gap: 12 (ref 5–15)
BUN: 7 mg/dL — ABNORMAL LOW (ref 8–23)
CO2: 24 mmol/L (ref 22–32)
Calcium: 9.8 mg/dL (ref 8.9–10.3)
Chloride: 95 mmol/L — ABNORMAL LOW (ref 98–111)
Creatinine, Ser: 0.8 mg/dL (ref 0.44–1.00)
GFR calc Af Amer: >60 mL/min (ref >60)
GFR calc non Af Amer: >60 mL/min (ref >60)
Glucose, Bld: 99 mg/dL (ref 70–99)
Potassium: 4.1 mmol/L (ref 3.5–5.1)
Sodium: 131 mmol/L — ABNORMAL LOW (ref 135–145)
Total Bilirubin: 0.3 mg/dL (ref 0.3–1.2)
Total Protein: 8.4 g/dL — ABNORMAL HIGH (ref 6.5–8.1)

## 2019-01-22 LAB — CBC
HCT: 45.5 % (ref 36.0–46.0)
Hemoglobin: 15.5 g/dL — ABNORMAL HIGH (ref 12.0–15.0)
MCH: 33.8 pg (ref 26.0–34.0)
MCHC: 34.1 g/dL (ref 30.0–36.0)
MCV: 99.1 fL (ref 80.0–100.0)
Platelets: 270 10*3/uL (ref 150–400)
RBC: 4.59 MIL/uL (ref 3.87–5.11)
RDW: 12.3 % (ref 11.5–15.5)
WBC: 7.9 10*3/uL (ref 4.0–10.5)
nRBC: 0 % (ref 0.0–0.2)

## 2019-01-22 LAB — PROTIME-INR
INR: 1 (ref 0.8–1.2)
Prothrombin Time: 13.5 seconds (ref 11.4–15.2)

## 2019-01-22 LAB — APTT: aPTT: 35 seconds (ref 24–36)

## 2019-01-22 MED ORDER — ALPRAZOLAM 0.5 MG PO TABS: ORAL_TABLET | ORAL | 2 refills | Status: AC

## 2019-01-22 NOTE — Progress Notes (Signed)
Future Appointments  Date Time Provider [place]  01/22/2019  1:20 PM [facility] None  01/22/2019  2:00 PM [facility] PAT 2 [facility] None  01/29/2019 11:00 AM [facility] LAB 4 [facility] None  01/29/2019 11:30 AM Curt Bears, MD [facility] None  03/08/2019  2:00 PM [name], MD [facility] None

## 2019-01-22 NOTE — Progress Notes (Addendum)
Anesthesia PAT Evaluation:  Case: [id number] Date/Time: 01/25/19 1103   Procedure: VIDEO BRONCHOSCOPY WITH ENDOBRONCHIAL ULTRASOUND (N/A )   Anesthesia type: General   Pre-op diagnosis:      RUL      MEDIASTINAL ADENOPATHY   Location: [facility] OR ROOM 10 / [facility]   Surgeon: Melrose Nakayama, MD      DISCUSSION: Patient is a 73 year old female scheduled for the above procedure. Patient is a smoker and underwent a low dose lung cancer screening CT on 12/11/18 that showed a right upper lobe mass with adjacent right upper lobe nodules and mediastinal/right hilar adenopathy worrisome for primary bronchogenic carcinoma. She has since seen ONC and CT surgery with the above surgery recommended for definitive tissue diagnosis.   History includes smoking, HTN, HLD, prediabetes, bilateral carotid disease, COPD/emphysema. Right L5-S1 microdiscectomy 12/15/00. C5-6 ACDF 10/07/10.   I evaluated patient due to reports of chest pain with occasional Nitro use. She said that she first noted occasional mid sternal "squeezing" chest pains approximately three years ago when she was transitioned from a calcium channel blocker to resume atenolol for migraine prophylaxis. She may develop chest pain as often as every month or go without chest pain for over six months. She has unprescribed Nitro that she uses on occasion if pain is lasting more than a few minutes. On the occasions when she does not take the Nitro for her symptoms the chest pain will last longer but has begun to ease up within minutes. She took Nitro in July 2020 and in August 2020. She had not had chest pain for at least the previous six months--but says pain was similar to her intermittent symptoms over the past three years. 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 The episode about 4 weeks ago was when she turned from  her usual prone sleeping position to her left side. In both cases the chest pain resolved after the Nitro. She has not had recurrent chest pain since then despite working on her land nearly every day last week including using a shovel and pick to dig holes to plant bushes and flowers, an ax to get up roots, and a manual "lopper" to prune trees. She denied associated symptoms with her chest pain such as nausea, diaphoresis, syncope/presyncope, jaw/arm pain, or new SOB. She does have some DOE. No edema. No history of CVA. She does not think that she has had her carotid disease re-evaluated since 2014. She does not feel like she needs her rescue inhaler.   Discussed with her concern for typical and atypical features of her chest pain. Most concerning is that it seems to be Nitro responsive, but on the other side it is reassuring that since her use of Nitro last month she has been very active without recurrent symptoms. She does have multiple CAD risk factors. I discussed that she may need cardiology evaluation or further testing prior to surgery. Fortunately, no symptoms today or within the past several weeks. She is a retired [employer] manager, but we still discussed recommendation to contact 911 for recurrent chest pain, particularly if no improvement with Nitro. Discussed that on-going follow-up would also be advised for her carotid artery disease.  Reviewed above with anesthesiologist Annye Asa, MD. Would recommend cardiology evaluation given history of chest pain as outlined above. Also would  not require urgent carotid US given she is asymptomatic from her carotid disease currently, but agree she should have ongoing follow-up. I have notified [facility] Ryan of need for cardiology preoperative evaluation.    01/22/19 CXR and COVID-19 test are in process.   ADDENDUM 01/24/19 12:23 PM: COVID-19 test negative. Patient seen by cardiologist Vernell Leep, MD on 01/23/19. A preoperative stress test was  recommended. In regards to carotid artery stenosis, he did not feel urgent re-evaluation was required as she was asymptomatic and findings would likely not change perioperative risk.  She had the nuclear stress test on 01/24/19 which was intermediate risk (medium reversible perfusion defect in basal to apical inferior/inferolateral myocardium, EF 69%). In regards to recommendations, Dr. Virgina Jock wrote,  "Cardiac for stratification/chest pain: Patient likely has 1-2 vessel coronary artery disease. The timing is tricky given suspected stage IIIa/IV bronchogenic carcinoma which requires biopsy for further staging and treatment plan.  I discussed the findings with operating surgeon Dr. Roxan Hockey.  The procedure is time sensitive and necessary to guide management of both possible lung cancer, as well as coronary artery disease.  Prognosis of lung cancer will have implications on invasive management of coronary artery disease.    We both agreed on the following recommendation.  We should proceed with video-assisted bronchoscopy and biopsy as scheduled on 01/25/2019, accepting intermediate perioperative cardiac risk.  Based on the findings of the biopsy, we will decide further course of management for coronary artery disease.  At this time, recommend aggressive medical management for coronary artery disease.  Patient include aspirin, which is currently held due to perioperative risk of bleeding.  Patient is statin intolerant and would like to continue Zetia at this time.  After diagnosis and prognosis of cancer is more clear, I will discuss with the patient regarding starting PCSK9 therapy.  Continue atenolol and Imdur.  I have discussed above recommendations with the patient at length.  She understands risks and benefits of performing the biopsy tomorrow I had of further invasive cardiac work-up."  Anesthesia team to re-evaluate on the day of surgery.   VS: BP (!) 144/89   Pulse 73   Temp (!) 35.8 C  (Oral)   Resp 18   Ht 5\' 2"  (1.575 m)   Wt 52.8 kg   SpO2 99%   BMI 21.29 kg/m  Heart RRR, no murmur. Right carotid without definite bruit, but less audible than on the left. No left carotid bruit noted. She had coarse crackles right lung base that cleared with cough. Otherwise lung sounds clear. No ankle edema.     PROVIDERS: [name], MD is PCP  Curt Bears, MD is Baker Pierini, MD is RAD-ONC. - Saw cardiologist Dorris Carnes, MD on 09/26/12 for carotid bruit, HTN, HLD. Carotid US ordered (see below).    LABS: Labs reviewed: Acceptable for surgery. (all labs ordered are listed, but only abnormal results are displayed)  Labs Reviewed  CBC - Abnormal; Notable for the following components:      Result Value   Hemoglobin 15.5 (*)    All other components within normal limits  COMPREHENSIVE METABOLIC PANEL - Abnormal; Notable for the following components:   Sodium 131 (*)    Chloride 95 (*)    BUN 7 (*)    Total Protein 8.4 (*)    All other components within normal limits  APTT  PROTIME-INR     IMAGES: CXR 01/22/19: IMPRESSION: 1. Mediastinum and right hilar mass. Right upper lobe mass. Similar findings noted  on recent CT and again are again consistent with malignancy. 2.  Chronic interstitial lung disease.   MRI Brain 01/15/19: IMPRESSION: No evidence of metastatic disease. Moderate chronic small-vessel ischemic changes.   PET Scan 01/03/19: IMPRESSION: 1. Peripheral right upper lobe pulmonary mass is markedly hypermetabolic with adjacent hypermetabolic satellite nodules and hypermetabolic right hilar and mediastinal lymphadenopathy. Small right cervical lymph node demonstrates hypermetabolism, also suspicious for metastatic involvement. 2. No evidence for hypermetabolic metastatic disease in the abdomen or pelvis. 3.  Aortic Atherosclerois (ICD10-170.0) 4.  Emphysema. (ZOX09-U04.9)   CT Chest low dose lung cancer screening  12/11/18: IMPRESSION: 1. Right upper lobe mass with adjacent right upper lobe nodules and mediastinal/right hilar adenopathy, findings most consistent with primary bronchogenic carcinoma. Lung-RADS 4B, suspicious. Additional imaging evaluation or consultation with Pulmonology or Thoracic Surgery recommended. These results will be called to the ordering clinician or representative by the Radiologist Assistant, and communication documented in the PACS or zVision Dashboard. 2. Associated obstruction of right upper lobe bronchi with volume loss in the medial aspect of the right upper lobe. 3. Aortic atherosclerosis (ICD10-170.0). Coronary artery Calcification. Atherosclerotic calcification of the aorta, aortic valve and coronary arteries. 4.  Emphysema (ICD10-J43.9).   EKG: 01/22/19: Normal sinus rhythm Septal infarct , age undetermined vs. lead placement abnormality Abnormal ECG No significant change since last tracing Confirmed by Mertie Moores [phone]) on 01/22/2019 3:31:05 PM   CV: Lexiscan Myoview stress test 01/24/19: Carlton Adam stress test was performed. Stress EKG is non-diagnostic, as this is pharmacological stress test. SPECT images demonstrate medium sized, medium intensity, reversible perfusion defect in basal to apical inferior/ inferolateral myocardium. Normal stress LVEF 69%. Intermediate risk study.    Carotid US 10/10/12: IMPRESSIONS:  Moderate heterogeneous plaque on the right. Mild heterogeneous plaque on the left. 60 to 79% right ICA stenosis. 40 to 59% left ICA stenosis. Patent vertebral arteries with antegrade flow. Follow-up in 6 months recommended.   Denied prior echo or cath.   Past Medical History:  Diagnosis Date  . Carotid bruit    Bilateral  . Chest pain    occasionally, takes nitro if needed, at rest (1-2 times a month)  . Colon polyps    with divertirticulitis  . COPD (chronic obstructive pulmonary disease) ([place])   . DJD (degenerative joint  disease)   . Emphysema lung ([place])   . Family history of adverse reaction to anesthesia    mom PONV  . Hyperlipidemia   . Hypertension   . Osteopenia   . Prediabetes   . Vitamin D deficiency     Past Surgical History:  Procedure Laterality Date  . CATARACT EXTRACTION Bilateral 03/17/2017  . COLONOSCOPY    . ESOPHAGOGASTRODUODENOSCOPY    . HAND SURGERY    . head injury  1995  . LAMINECTOMY  2002   L 5   . NECK SURGERY  2012    c4 -5/6  fushion  . TONSILLECTOMY    . TRIGGER FINGER RELEASE      MEDICATIONS: . albuterol (PROAIR HFA) 108 (90 Base) MCG/ACT inhaler  . ALPRAZolam (XANAX) 0.5 MG tablet  . aspirin 81 MG tablet  . atenolol (TENORMIN) 25 MG tablet  . cholecalciferol (VITAMIN D) 1000 units tablet  . diphenhydramine-acetaminophen (TYLENOL PM) 25-500 MG TABS tablet  . ezetimibe (ZETIA) 10 MG tablet  . ibuprofen (ADVIL) 200 MG tablet   No current facility-administered medications for this encounter.      Myra Gianotti, PA-C Surgical Short Stay/Anesthesiology [facility] Phone [phone] [facility]  Phone [phone] 01/22/2019 5:34 PM

## 2019-01-22 NOTE — Progress Notes (Signed)
PCP - [name] Cardiologist - denies   Chest x-ray - 01/22/19 EKG - 02/14/18 Stress Test -  denies ECHO - denies Cardiac Cath - denies  Aspirin Instructions: last dose 9/21   COVID TEST- pending result   Anesthesia review: yes, anesthesia to come see patient due to hx of chest pain for the last 3 years at least happens one or two times month.  Patient stated that it has happened at rest.  Patient stated that she has taken nitro for the chest pain.  Patient denies shortness of breath, fever, cough and chest pain at PAT appointment   Patient verbalized understanding of instructions that were given to them at the PAT appointment. Patient was also instructed that they will need to review over the PAT instructions again at home before surgery.

## 2019-01-23 ENCOUNTER — Ambulatory Visit: Payer: Medicare HMO | Admitting: Cardiology

## 2019-01-23 ENCOUNTER — Encounter: Payer: Self-pay | Admitting: Cardiology

## 2019-01-23 ENCOUNTER — Inpatient Hospital Stay: Payer: Medicare HMO

## 2019-01-23 ENCOUNTER — Inpatient Hospital Stay: Payer: Medicare HMO | Admitting: Internal Medicine

## 2019-01-23 VITALS — BP 109/69 | HR 73 | Ht 62.0 in | Wt 115.3 lb

## 2019-01-23 DIAGNOSIS — Z720 Tobacco use: Secondary | ICD-10-CM

## 2019-01-23 DIAGNOSIS — I739 Peripheral vascular disease, unspecified: Secondary | ICD-10-CM | POA: Insufficient documentation

## 2019-01-23 DIAGNOSIS — R079 Chest pain, unspecified: Secondary | ICD-10-CM

## 2019-01-23 DIAGNOSIS — E782 Mixed hyperlipidemia: Secondary | ICD-10-CM

## 2019-01-23 MED ORDER — NITROGLYCERIN 0.4 MG SL SUBL: 0.4000 mg | SUBLINGUAL_TABLET | SUBLINGUAL | 3 refills | Status: AC | PRN

## 2019-01-23 MED ORDER — ISOSORBIDE MONONITRATE ER 30 MG PO TB24: 30.0000 mg | ORAL_TABLET | Freq: Every day | ORAL | 3 refills | Status: DC

## 2019-01-23 NOTE — Progress Notes (Signed)
Patient referred by Melrose Nakayama, * for chest pain.  Subjective:   Stacy [name], female    DOB: [birth year], 73 y.o.   MRN: [id number]   Chief Complaint  Patient presents with  . Chest Pain  . New Patient (Initial Visit)    pre-op for bronchoscopy     HPI  73 y.o. Caucasian female with tobacco abuse, hyperlipidemia, family history of coronary artery disease, new diagnosis of likely bronchogenic carcinoma, referred for evaluation of chest pain.  Patient recently underwent low-dose CT scan for cancer screening.  This showed right upper lobe mass with adjacent right upper lobe nodules and mediastinal/right hilar adenopathy, findings most consistent with primary bronchogenic carcinoma.  Patient is scheduled to undergo bronchoscopy and biopsy on 01/25/2019.  During preop anesthesia work-up, patient mentioned complaints of chest pain.  She was thus referred for cardiac re-stratification and evaluation.  Patient is a retired Marine scientist, stays active with multiform related work.  She reports squeezing retrosternal pain across her chest that does occur with physical activity, and release with rest and nitroglycerin.  She also has occasional episodes of chest pain that are positional, and occurring at rest.  She has a stable exertional dyspnea with more than usual activity.  She has longstanding history of emphysema.  Patient has tried Lipitor and Crestor for hyperlipidemia in the past, and could not tolerate due to severe myalgias.  She is currently on Zetia and not willing to try statins again at this time.  Patient is very clear about her wishes.  She does not want resuscitation.  She is willing to undergo biopsy as planned for staging and possible treatment.  Past Medical History:  Diagnosis Date  . Carotid bruit    Bilateral  . Chest pain    occasionally, takes nitro if needed, at rest (1-2 times a month)  . Colon polyps    with divertirticulitis  . COPD (chronic obstructive  pulmonary disease) ([place])   . DJD (degenerative joint disease)   . Emphysema lung ([place])   . Family history of adverse reaction to anesthesia    mom PONV  . Hyperlipidemia   . Hypertension   . Lymph node cancer ([place])   . Osteopenia   . Prediabetes   . Vitamin D deficiency      Past Surgical History:  Procedure Laterality Date  . CATARACT EXTRACTION Bilateral 03/17/2017  . COLONOSCOPY    . ESOPHAGOGASTRODUODENOSCOPY    . HAND SURGERY    . head injury  1995  . LAMINECTOMY  2002   L 5   . NECK SURGERY  2012    c4 -5/6  fushion  . TONSILLECTOMY    . TRIGGER FINGER RELEASE       Social History   Socioeconomic History  . Marital status: Divorced    Spouse name: Not on file  . Number of children: 3  . Years of education: Not on file  . Highest education level: Not on file  Occupational History  . Not on file  Social Needs  . Financial resource strain: Not on file  . Food insecurity    Worry: Not on file    Inability: Not on file  . Transportation needs    Medical: Not on file    Non-medical: Not on file  Tobacco Use  . Smoking status: Current Every Day Smoker    Packs/day: 1.00  . Smokeless tobacco: Never Used  . Tobacco comment: smokes 2 cigarettes per day  Substance  and Sexual Activity  . Alcohol use: No  . Drug use: Never  . Sexual activity: Not on file  Lifestyle  . Physical activity    Days per week: Not on file    Minutes per session: Not on file  . Stress: Not on file  Relationships  . Social Herbalist on phone: Not on file    Gets together: Not on file    Attends religious service: Not on file    Active member of club or organization: Not on file    Attends meetings of clubs or organizations: Not on file    Relationship status: Not on file  . Intimate partner violence    Fear of current or ex partner: Not on file    Emotionally abused: Not on file    Physically abused: Not on file    Forced sexual activity: Not on file  Other Topics  Concern  . Not on file  Social History Narrative   Divorced- 3children /2 girls and 1/boy--RN ( works as an Production assistant, radio at [employer])     Family History  Problem Relation Age of Onset  . Hyperlipidemia Mother   . Heart disease Mother        7 way bypass/ value replacement/pacemaker  . Heart attack Mother 59  . Diabetes Mother   . Cancer - Other Mother        lung  . Hypertension Father   . Heart attack Father 69  . Cancer - Other Father        throat     Current Outpatient Medications on File Prior to Visit  Medication Sig Dispense Refill  . albuterol (PROAIR HFA) 108 (90 Base) MCG/ACT inhaler Inhale 2 puffs into the lungs every 6 (six) hours as needed for wheezing or shortness of breath. 18 g 1  . ALPRAZolam (XANAX) 0.5 MG tablet TAKE 1/2 TO 1 (ONE-HALF TO ONE) TABLET BY MOUTH TWICE DAILY AS NEEDED FOR ANXIETY OR&nbsp;&nbsp;SLEEP 60 tablet 2  . aspirin 81 MG tablet Take 162 mg by mouth daily.     Marland Kitchen atenolol (TENORMIN) 25 MG tablet Take 1 tablet Daily for BP (Patient taking differently: Take 25 mg by mouth daily. ) 90 tablet 3  . cholecalciferol (VITAMIN D) 1000 units tablet Take 1,000 Units by mouth daily.     . diphenhydramine-acetaminophen (TYLENOL PM) 25-500 MG TABS tablet Take 1 tablet by mouth at bedtime as needed (sleep).     . ezetimibe (ZETIA) 10 MG tablet TAKE 1 TABLET BY MOUTH ONCE DAILY FOR CHOLESTEROL (Patient taking differently: Take 10 mg by mouth daily. ) 90 tablet 0  . ibuprofen (ADVIL) 200 MG tablet Take 600 mg by mouth every 6 (six) hours as needed for headache or mild pain.     No current facility-administered medications on file prior to visit.     Cardiovascular studies:  EKG 01/23/2019: Sinus rhythm 69 bpm.  Possible old anteroseptal infarct. Nonspecific T wave inversion inferior leads.  CT Chest 12/11/2018 1. Right upper lobe mass with adjacent right upper lobe nodules and mediastinal/right hilar adenopathy, findings most consistent with  primary bronchogenic carcinoma. Lung-RADS 4B, suspicious. Additional imaging evaluation or consultation with Pulmonology or Thoracic Surgery recommended. These results will be called to the ordering clinician or representative by the Radiologist Assistant, and communication documented in the PACS or zVision Dashboard. 2. Associated obstruction of right upper lobe bronchi with volume loss in the medial aspect of the right upper lobe.  3. Aortic atherosclerosis (ICD10-170.0). Coronary artery calcification. 4.  Emphysema (ICD10-J43.9).  Vascular US 2014: 60-79% RICA stenosis 95-18% LICA stenosis   Recent labs: Results for BRITENY, FULGHUM (MRN [id number]) as of 01/23/2019 10:44  Ref. Range 01/22/2019 14:20  Sodium Latest Ref Range: 135 - 145 mmol/L 131 (L)  Potassium Latest Ref Range: 3.5 - 5.1 mmol/L 4.1  Chloride Latest Ref Range: 98 - 111 mmol/L 95 (L)  CO2 Latest Ref Range: 22 - 32 mmol/L 24  Glucose Latest Ref Range: 70 - 99 mg/dL 99  BUN Latest Ref Range: 8 - 23 mg/dL 7 (L)  Creatinine Latest Ref Range: 0.44 - 1.00 mg/dL 0.80  Calcium Latest Ref Range: 8.9 - 10.3 mg/dL 9.8  Anion gap Latest Ref Range: 5 - 15  12  Alkaline Phosphatase Latest Ref Range: 38 - 126 U/L 81  Albumin Latest Ref Range: 3.5 - 5.0 g/dL 4.1  AST Latest Ref Range: 15 - 41 U/L 20  ALT Latest Ref Range: 0 - 44 U/L 10  Total Protein Latest Ref Range: 6.5 - 8.1 g/dL 8.4 (H)  Total Bilirubin Latest Ref Range: 0.3 - 1.2 mg/dL 0.3  GFR, Est Non African American Latest Ref Range: >60 mL/min >60  GFR, Est African American Latest Ref Range: >60 mL/min >60   Results for JOHNESHA, ACHEAMPONG (MRN [id number]) as of 01/23/2019 10:44  Ref. Range 01/22/2019 14:20  WBC Latest Ref Range: 4.0 - 10.5 K/uL 7.9  RBC Latest Ref Range: 3.87 - 5.11 MIL/uL 4.59  Hemoglobin Latest Ref Range: 12.0 - 15.0 g/dL 15.5 (H)  HCT Latest Ref Range: 36.0 - 46.0 % 45.5  MCV Latest Ref Range: 80.0 - 100.0 fL 99.1  MCH Latest Ref Range: 26.0 -  34.0 pg 33.8  MCHC Latest Ref Range: 30.0 - 36.0 g/dL 34.1  RDW Latest Ref Range: 11.5 - 15.5 % 12.3  Platelets Latest Ref Range: 150 - 400 K/uL 270  nRBC Latest Ref Range: 0.0 - 0.2 % 0.0   Results for OLLA, DELANCEY (MRN [id number]) as of 01/23/2019 10:44  Ref. Range 11/27/2018 15:22  eAG (mmol/L) Latest Units: (calc) 6.5  Glucose Latest Ref Range: 65 - 99 mg/dL 85  Hemoglobin A1C Latest Ref Range: <5.7 % of total Hgb 5.7 (H)   Results for RAKHI, ROMAGNOLI (MRN [id number]) as of 01/23/2019 10:44  Ref. Range 11/27/2018 15:22  Total CHOL/HDL Ratio Latest Ref Range: <5.0 (calc) 4.7  Cholesterol Latest Ref Range: <200 mg/dL 281 (H)  HDL Cholesterol Latest Ref Range: > OR = 50 mg/dL 60  LDL Cholesterol (Calc) Latest Units: mg/dL (calc) 165 (H)  Non-HDL Cholesterol (Calc) Latest Ref Range: <130 mg/dL (calc) 221 (H)  Triglycerides Latest Ref Range: <150 mg/dL 333 (H)    Review of Systems  Constitution: Negative for decreased appetite, malaise/fatigue, weight gain and weight loss.  HENT: Negative for congestion.   Eyes: Negative for visual disturbance.  Cardiovascular: Positive for chest pain and dyspnea on exertion. Negative for leg swelling, palpitations and syncope.  Respiratory: Negative for cough.   Endocrine: Negative for cold intolerance.  Hematologic/Lymphatic: Does not bruise/bleed easily.  Skin: Negative for itching and rash.  Musculoskeletal: Negative for myalgias.  Gastrointestinal: Negative for abdominal pain, nausea and vomiting.  Genitourinary: Negative for dysuria.  Neurological: Negative for dizziness and weakness.  Psychiatric/Behavioral: The patient is not nervous/anxious.   All other systems reviewed and are negative.        Vitals:   01/23/19 0952  BP: 109/69  Pulse: 73  SpO2: 98%     Body mass index is 21.09 kg/m. Filed Weights   01/23/19 0952  Weight: 115 lb 4.8 oz (52.3 kg)     Objective:   Physical Exam  Constitutional: She is oriented to  person, place, and time. She appears well-developed and well-nourished. No distress.  HENT:  Head: Normocephalic and atraumatic.  Eyes: Pupils are equal, round, and reactive to light. Conjunctivae are normal.  Neck: No JVD present.  Cardiovascular: Normal rate and regular rhythm. Exam reveals decreased pulses.  No murmur heard. Pulses:      Popliteal pulses are 1+ on the right side and 1+ on the left side.       Dorsalis pedis pulses are 0 on the right side and 0 on the left side.       Posterior tibial pulses are 1+ on the right side and 1+ on the left side.  Pulmonary/Chest: Effort normal and breath sounds normal. She has no wheezes. She has no rales.  Abdominal: Soft. Bowel sounds are normal. There is no rebound.  Musculoskeletal:        General: No edema.  Lymphadenopathy:    She has no cervical adenopathy.  Neurological: She is alert and oriented to person, place, and time. No cranial nerve deficit.  Skin: Skin is warm and dry.  Psychiatric: She has a normal mood and affect.  Nursing note and vitals reviewed.         Assessment & Recommendations:   73 y.o. Caucasian female with tobacco abuse, hyperlipidemia, family history of coronary artery disease, new diagnosis of likely bronchogenic carcinoma, referred for evaluation of chest pain.  Cardiac for stratification/chest pain: Certainly has some features of typical angina.  Given her risk factors for CAD including hyperlipidemia, tobacco abuse, family history of CAD, and noted aortic and coronary calcification on CT chest, she warrants further investigation prior to bronchoscopy.  I will obtain Lexiscan stress test on 9/23 morning.  I detailed discussion with the patient regarding anticipated results and further recommendations.  If stress test is low or intermediate risk, it may be prudent to proceed with bronchoscopy with biopsy, accepting intermediate cardiac risk, given the time sensitive nature of this procedure.  Also,  invasive management immediately before a bronchoscopy will not necessarily mitigate perioperative risk.  If she has high risk findings on stress test, I will discuss with Dr. Roxan Hockey regarding timing of the surgery and management of possible CAD.  At this time, recommend medical management with aspirin, atenolol, addition of Imdur 30 mg.  Patient is on Zetia due to statin intolerance and unwilling to try statins at this time.  Peripheral artery disease: Patient has moderate carotid artery disease, noted on vascular ultrasound in 2014.  However, she does not have any stroke/TIA symptoms at this time.  I did not hear a loud murmur on physical exam.  In addition, she has diminished pulses in her feet, but does not have any claudication symptoms at this time.  I do not think work-up for this is urgent, as it would not change perioperative cardiac risk.   Thank you for referring the patient to [place]. Please feel free to contact with any questions.  Nigel Mormon, MD [facility] Pager: [phone] Office: [phone] If no answer Cell [phone]

## 2019-01-24 ENCOUNTER — Ambulatory Visit (INDEPENDENT_AMBULATORY_CARE_PROVIDER_SITE_OTHER): Payer: Medicare HMO | Admitting: Cardiology

## 2019-01-24 ENCOUNTER — Encounter: Payer: Self-pay | Admitting: Cardiology

## 2019-01-24 ENCOUNTER — Other Ambulatory Visit: Payer: Self-pay

## 2019-01-24 ENCOUNTER — Ambulatory Visit (INDEPENDENT_AMBULATORY_CARE_PROVIDER_SITE_OTHER): Payer: Medicare HMO

## 2019-01-24 VITALS — BP 133/94 | HR 70 | Temp 96.7°F | Ht 62.0 in | Wt 115.0 lb

## 2019-01-24 DIAGNOSIS — E782 Mixed hyperlipidemia: Secondary | ICD-10-CM

## 2019-01-24 DIAGNOSIS — R079 Chest pain, unspecified: Secondary | ICD-10-CM | POA: Diagnosis not present

## 2019-01-24 DIAGNOSIS — I739 Peripheral vascular disease, unspecified: Secondary | ICD-10-CM | POA: Diagnosis not present

## 2019-01-24 DIAGNOSIS — Z720 Tobacco use: Secondary | ICD-10-CM | POA: Diagnosis not present

## 2019-01-24 DIAGNOSIS — Z01810 Encounter for preprocedural cardiovascular examination: Secondary | ICD-10-CM | POA: Diagnosis not present

## 2019-01-24 DIAGNOSIS — R0789 Other chest pain: Secondary | ICD-10-CM | POA: Diagnosis not present

## 2019-01-24 LAB — NOVEL CORONAVIRUS, NAA (HOSP ORDER, SEND-OUT TO REF LAB; TAT 18-24 HRS): SARS-CoV-2, NAA: NOT DETECTED

## 2019-01-24 NOTE — Anesthesia Preprocedure Evaluation (Addendum)
Anesthesia Evaluation  Patient identified by MRN, date of birth, ID band Patient awake    Reviewed: Allergy & Precautions, NPO status , Patient's Chart, lab work & pertinent test results, reviewed documented beta blocker date and time   History of Anesthesia Complications Negative for: history of anesthetic complications  Airway Mallampati: I  TM Distance: >3 FB Neck ROM: Full    Dental  (+) Dental Advisory Given, Missing, Chipped   Pulmonary COPD, Current Smoker and Patient abstained from smoking.,  01/22/2019 SARS coronavirus NEG   breath sounds clear to auscultation       Cardiovascular hypertension, Pt. on medications and Pt. on home beta blockers + angina with exertion + CAD and + Peripheral Vascular Disease   Rhythm:Regular Rate:Normal  01/24/2019 Stress: SPECT images demonstrate medium sized, medium intensity, reversible perfusion defect in basal to apical inferior/ inferolateral myocardium. Normal stress LVEF 69%. Intermediate risk study.   See notes from [facility]: will proceed given increased cardiac risk    Neuro/Psych negative neurological ROS     GI/Hepatic negative GI ROS, Neg liver ROS,   Endo/Other  negative endocrine ROS  Renal/GU negative Renal ROS     Musculoskeletal  (+) Arthritis ,   Abdominal   Peds  Hematology negative hematology ROS (+)   Anesthesia Other Findings   Reproductive/Obstetrics                           Anesthesia Physical Anesthesia Plan  ASA: IV  Anesthesia Plan: General   Post-op Pain Management:    Induction: Intravenous  PONV Risk Score and Plan: 2 and Ondansetron and Dexamethasone  Airway Management Planned: Oral ETT  Additional Equipment: Arterial line  Intra-op Plan:   Post-operative Plan: Extubation in OR  Informed Consent: I have reviewed the patients History and Physical, chart, labs and discussed the procedure  including the risks, benefits and alternatives for the proposed anesthesia with the patient or authorized representative who has indicated his/her understanding and acceptance.     Dental advisory given  Plan Discussed with: CRNA and Surgeon  Anesthesia Plan Comments: (See PAT note written 01/24/2019 by Myra Gianotti, PA-C.  Preoperative cardiology input by Dr. Virgina Jock following 01/24/19 intermediate risk stress test: "Cardiac for stratification/chest pain: Patient likely has 1-2 vessel coronary artery disease. The timing is tricky given suspected stage IIIa/IV bronchogenic carcinoma which requires biopsy for further staging and treatment plan.  I discussed the findings with operating surgeon Dr. Roxan Hockey.  The procedure is time sensitive and necessary to guide management of both possible lung cancer, as well as coronary artery disease.  Prognosis of lung cancer will have implications on invasive management of coronary artery disease.    We both agreed on the following recommendation.  We should proceed with video-assisted bronchoscopy and biopsy as scheduled on 01/25/2019, accepting intermediate perioperative cardiac risk.  Based on the findings of the biopsy, we will decide further course of management for coronary artery disease.  At this time, recommend aggressive medical management for coronary artery disease.  Patient include aspirin, which is currently held due to perioperative risk of bleeding.  Patient is statin intolerant and would like to continue Zetia at this time.  After diagnosis and prognosis of cancer is more clear, I will discuss with the patient regarding starting PCSK9 therapy.  Continue atenolol and Imdur.  I have discussed above recommendations with the patient at length.  She understands risks and benefits of performing the biopsy  tomorrow I had of further invasive cardiac work-up." )       Anesthesia Quick Evaluation

## 2019-01-24 NOTE — Progress Notes (Signed)
Patient referred by [name], MD for chest pain.  Subjective:   Stacy [name], female    DOB: 28-Nov-1945, 73 y.o.   MRN: [id number]  Chief Complaint  Patient presents with  . Follow-up    stress test  . Chest Pain     HPI  73 y.o. Caucasian female with tobacco abuse, hyperlipidemia, family history of coronary artery disease, new diagnosis of likely bronchogenic carcinoma, referred for evaluation of chest pain.  Patient was seen by me yesterday for preop evaluation prior to video-assisted bronchoscopy and biopsy for staging of likely bronchogenic carcinoma.  Patient underwent Lexiscan nuclear stress test, which shows intermediate risk findings, details below.  Patient does not have any chest pain at rest.  She has stable exertional dyspnea.  Past Medical History:  Diagnosis Date  . Carotid bruit    Bilateral  . Chest pain    occasionally, takes nitro if needed, at rest (1-2 times a month)  . Colon polyps    with divertirticulitis  . COPD (chronic obstructive pulmonary disease) ([place])   . DJD (degenerative joint disease)   . Emphysema lung ([place])   . Family history of adverse reaction to anesthesia    mom PONV  . Hyperlipidemia   . Hypertension   . Lymph node cancer ([place])   . Osteopenia   . Prediabetes   . Vitamin D deficiency      Past Surgical History:  Procedure Laterality Date  . CATARACT EXTRACTION Bilateral 03/17/2017  . COLONOSCOPY    . ESOPHAGOGASTRODUODENOSCOPY    . HAND SURGERY    . head injury  1995  . LAMINECTOMY  2002   L 5   . NECK SURGERY  2012    c4 -5/6  fushion  . TONSILLECTOMY    . TRIGGER FINGER RELEASE       Social History   Socioeconomic History  . Marital status: Divorced    Spouse name: Not on file  . Number of children: 3  . Years of education: Not on file  . Highest education level: Not on file  Occupational History  . Not on file  Social Needs  . Financial resource strain: Not on file  . Food insecurity   Worry: Not on file    Inability: Not on file  . Transportation needs    Medical: Not on file    Non-medical: Not on file  Tobacco Use  . Smoking status: Current Every Day Smoker    Packs/day: 1.00  . Smokeless tobacco: Never Used  . Tobacco comment: smokes 2 cigarettes per day  Substance and Sexual Activity  . Alcohol use: No  . Drug use: Never  . Sexual activity: Not on file  Lifestyle  . Physical activity    Days per week: Not on file    Minutes per session: Not on file  . Stress: Not on file  Relationships  . Social Herbalist on phone: Not on file    Gets together: Not on file    Attends religious service: Not on file    Active member of club or organization: Not on file    Attends meetings of clubs or organizations: Not on file    Relationship status: Not on file  . Intimate partner violence    Fear of current or ex partner: Not on file    Emotionally abused: Not on file    Physically abused: Not on file    Forced sexual activity: Not  on file  Other Topics Concern  . Not on file  Social History Narrative   Divorced- 3children /2 girls and 1/boy--RN ( works as an Production assistant, radio at [employer])     Family History  Problem Relation Age of Onset  . Hyperlipidemia Mother   . Heart disease Mother        7 way bypass/ value replacement/pacemaker  . Heart attack Mother 3  . Diabetes Mother   . Cancer - Other Mother        lung  . Hypertension Father   . Heart attack Father 67  . Cancer - Other Father        throat     Current Outpatient Medications on File Prior to Visit  Medication Sig Dispense Refill  . albuterol (PROAIR HFA) 108 (90 Base) MCG/ACT inhaler Inhale 2 puffs into the lungs every 6 (six) hours as needed for wheezing or shortness of breath. 18 g 1  . ALPRAZolam (XANAX) 0.5 MG tablet TAKE 1/2 TO 1 (ONE-HALF TO ONE) TABLET BY MOUTH TWICE DAILY AS NEEDED FOR ANXIETY OR  SLEEP 60 tablet 2  . aspirin 81 MG tablet Take 162 mg by mouth daily.      Marland Kitchen atenolol (TENORMIN) 25 MG tablet Take 1 tablet Daily for BP (Patient taking differently: Take 25 mg by mouth daily. ) 90 tablet 3  . cholecalciferol (VITAMIN D) 1000 units tablet Take 1,000 Units by mouth daily.     . diphenhydramine-acetaminophen (TYLENOL PM) 25-500 MG TABS tablet Take 1 tablet by mouth at bedtime as needed (sleep).     . ezetimibe (ZETIA) 10 MG tablet TAKE 1 TABLET BY MOUTH ONCE DAILY FOR CHOLESTEROL (Patient taking differently: Take 10 mg by mouth daily. ) 90 tablet 0  . ibuprofen (ADVIL) 200 MG tablet Take 600 mg by mouth every 6 (six) hours as needed for headache or mild pain.    . isosorbide mononitrate (IMDUR) 30 MG 24 hr tablet Take 1 tablet (30 mg total) by mouth daily. 90 tablet 3  . nitroGLYCERIN (NITROSTAT) 0.4 MG SL tablet Place 1 tablet (0.4 mg total) under the tongue every 5 (five) minutes as needed for chest pain. 30 tablet 3   No current facility-administered medications on file prior to visit.     Cardiovascular studies:  Lexiscan Myoview stress test 01/24/2019: Lexiscan stress test was performed. Stress EKG is non-diagnostic, as this is pharmacological stress test. SPECT images demonstrate medium sized, medium intensity, reversible perfusion defect in basal to apical inferior/ inferolateral myocardium. Normal stress LVEF 69%. Intermediate risk study.   EKG 01/23/2019: Sinus rhythm 69 bpm.  Possible old anteroseptal infarct. Nonspecific T wave inversion inferior leads.  CT Chest 12/11/2018 1. Right upper lobe mass with adjacent right upper lobe nodules and mediastinal/right hilar adenopathy, findings most consistent with primary bronchogenic carcinoma. Lung-RADS 4B, suspicious. Additional imaging evaluation or consultation with Pulmonology or Thoracic Surgery recommended. These results will be called to the ordering clinician or representative by the Radiologist Assistant, and communication documented in the PACS or zVision Dashboard. 2. Associated  obstruction of right upper lobe bronchi with volume loss in the medial aspect of the right upper lobe. 3. Aortic atherosclerosis (ICD10-170.0). Coronary artery calcification. 4.  Emphysema (ICD10-J43.9).  Vascular US 2014: 60-79% RICA stenosis 40-10% LICA stenosis   Recent labs: Results for ARMILDA, VANDERLINDEN (MRN [id number]) as of 01/23/2019 10:44  Ref. Range 01/22/2019 14:20  Sodium Latest Ref Range: 135 - 145 mmol/L 131 (L)  Potassium Latest  Ref Range: 3.5 - 5.1 mmol/L 4.1  Chloride Latest Ref Range: 98 - 111 mmol/L 95 (L)  CO2 Latest Ref Range: 22 - 32 mmol/L 24  Glucose Latest Ref Range: 70 - 99 mg/dL 99  BUN Latest Ref Range: 8 - 23 mg/dL 7 (L)  Creatinine Latest Ref Range: 0.44 - 1.00 mg/dL 0.80  Calcium Latest Ref Range: 8.9 - 10.3 mg/dL 9.8  Anion gap Latest Ref Range: 5 - 15  12  Alkaline Phosphatase Latest Ref Range: 38 - 126 U/L 81  Albumin Latest Ref Range: 3.5 - 5.0 g/dL 4.1  AST Latest Ref Range: 15 - 41 U/L 20  ALT Latest Ref Range: 0 - 44 U/L 10  Total Protein Latest Ref Range: 6.5 - 8.1 g/dL 8.4 (H)  Total Bilirubin Latest Ref Range: 0.3 - 1.2 mg/dL 0.3  GFR, Est Non African American Latest Ref Range: >60 mL/min >60  GFR, Est African American Latest Ref Range: >60 mL/min >60   Results for ELEA, HOLTZCLAW (MRN [id number]) as of 01/23/2019 10:44  Ref. Range 01/22/2019 14:20  WBC Latest Ref Range: 4.0 - 10.5 K/uL 7.9  RBC Latest Ref Range: 3.87 - 5.11 MIL/uL 4.59  Hemoglobin Latest Ref Range: 12.0 - 15.0 g/dL 15.5 (H)  HCT Latest Ref Range: 36.0 - 46.0 % 45.5  MCV Latest Ref Range: 80.0 - 100.0 fL 99.1  MCH Latest Ref Range: 26.0 - 34.0 pg 33.8  MCHC Latest Ref Range: 30.0 - 36.0 g/dL 34.1  RDW Latest Ref Range: 11.5 - 15.5 % 12.3  Platelets Latest Ref Range: 150 - 400 K/uL 270  nRBC Latest Ref Range: 0.0 - 0.2 % 0.0   Results for CONNER, NEISS (MRN [id number]) as of 01/23/2019 10:44  Ref. Range 11/27/2018 15:22  eAG (mmol/L) Latest Units: (calc) 6.5   Glucose Latest Ref Range: 65 - 99 mg/dL 85  Hemoglobin A1C Latest Ref Range: <5.7 % of total Hgb 5.7 (H)   Results for TREENA, COSMAN (MRN [id number]) as of 01/23/2019 10:44  Ref. Range 11/27/2018 15:22  Total CHOL/HDL Ratio Latest Ref Range: <5.0 (calc) 4.7  Cholesterol Latest Ref Range: <200 mg/dL 281 (H)  HDL Cholesterol Latest Ref Range: > OR = 50 mg/dL 60  LDL Cholesterol (Calc) Latest Units: mg/dL (calc) 165 (H)  Non-HDL Cholesterol (Calc) Latest Ref Range: <130 mg/dL (calc) 221 (H)  Triglycerides Latest Ref Range: <150 mg/dL 333 (H)    Review of Systems  Constitution: Negative for decreased appetite, malaise/fatigue, weight gain and weight loss.  HENT: Negative for congestion.   Eyes: Negative for visual disturbance.  Cardiovascular: Positive for chest pain and dyspnea on exertion. Negative for leg swelling, palpitations and syncope.  Respiratory: Negative for cough.   Endocrine: Negative for cold intolerance.  Hematologic/Lymphatic: Does not bruise/bleed easily.  Skin: Negative for itching and rash.  Musculoskeletal: Negative for myalgias.  Gastrointestinal: Negative for abdominal pain, nausea and vomiting.  Genitourinary: Negative for dysuria.  Neurological: Negative for dizziness and weakness.  Psychiatric/Behavioral: The patient is not nervous/anxious.   All other systems reviewed and are negative.        Vitals:   01/24/19 1152  BP: (!) 133/94  Pulse: 70  Temp: (!) 96.7 F (35.9 C)  SpO2: 99%     Body mass index is 21.03 kg/m. Filed Weights   01/24/19 1152  Weight: 115 lb (52.2 kg)     Objective:   Physical Exam  Constitutional: She is oriented to person, place, and time. She  appears well-developed and well-nourished. No distress.  HENT:  Head: Normocephalic and atraumatic.  Eyes: Pupils are equal, round, and reactive to light. Conjunctivae are normal.  Neck: No JVD present.  Cardiovascular: Normal rate and regular rhythm. Exam reveals decreased  pulses.  No murmur heard. Pulses:      Popliteal pulses are 1+ on the right side and 1+ on the left side.       Dorsalis pedis pulses are 0 on the right side and 0 on the left side.       Posterior tibial pulses are 1+ on the right side and 1+ on the left side.  Pulmonary/Chest: Effort normal and breath sounds normal. She has no wheezes. She has no rales.  Abdominal: Soft. Bowel sounds are normal. There is no rebound.  Musculoskeletal:        General: No edema.  Lymphadenopathy:    She has no cervical adenopathy.  Neurological: She is alert and oriented to person, place, and time. No cranial nerve deficit.  Skin: Skin is warm and dry.  Psychiatric: She has a normal mood and affect.  Nursing note and vitals reviewed.         Assessment & Recommendations:   73 y.o. Caucasian female with tobacco abuse, hyperlipidemia, family history of coronary artery disease, new diagnosis of likely bronchogenic carcinoma, referred for evaluation of chest pain.  Cardiac for stratification/chest pain: Patient likely has 1-2 vessel coronary artery disease. The timing is tricky given suspected stage IIIa/IV bronchogenic carcinoma which requires biopsy for further staging and treatment plan.  I discussed the findings with operating surgeon Dr. Roxan Hockey.  The procedure is time sensitive and necessary to guide management of both possible lung cancer, as well as coronary artery disease.  Prognosis of lung cancer will have implications on invasive management of coronary artery disease.    We both agreed on the following recommendation.  We should proceed with video-assisted bronchoscopy and biopsy as scheduled on 01/25/2019, accepting intermediate perioperative cardiac risk.  Based on the findings of the biopsy, we will decide further course of management for coronary artery disease.  At this time, recommend aggressive medical management for coronary artery disease.  Patient include aspirin, which is currently  held due to perioperative risk of bleeding.  Patient is statin intolerant and would like to continue Zetia at this time.  After diagnosis and prognosis of cancer is more clear, I will discuss with the patient regarding starting PCSK9 therapy.  Continue atenolol and Imdur.  I have discussed above recommendations with the patient at length.  She understands risks and benefits of performing the biopsy tomorrow I had of further invasive cardiac work-up.  Exertional dyspnea: I suspect this is largely related to her emphysema and possibly lung cancer.   Peripheral artery disease: Patient has moderate carotid artery disease, noted on vascular ultrasound in 2014.  However, she does not have any stroke/TIA symptoms at this time.  I did not hear a loud murmur on physical exam.  In addition, she has diminished pulses in her feet, but does not have any claudication symptoms at this time.  I do not think work-up for this is urgent, as it would not change perioperative cardiac risk.   Thank you for referring the patient to [place]. Please feel free to contact with any questions.  Nigel Mormon, MD [facility] Pager: [phone] Office: [phone] If no answer Cell [phone]

## 2019-01-25 ENCOUNTER — Encounter (HOSPITAL_COMMUNITY): Payer: Self-pay | Admitting: Certified Registered Nurse Anesthetist

## 2019-01-25 ENCOUNTER — Other Ambulatory Visit: Payer: Self-pay

## 2019-01-25 ENCOUNTER — Ambulatory Visit (HOSPITAL_COMMUNITY): Payer: Medicare HMO

## 2019-01-25 ENCOUNTER — Encounter (HOSPITAL_COMMUNITY)
Admission: RE | Disposition: A | Discharge status: Home / Self Care | Payer: Self-pay | Source: Home / Self Care | Attending: Thoracic Surgery (Cardiothoracic Vascular Surgery)

## 2019-01-25 ENCOUNTER — Ambulatory Visit (HOSPITAL_COMMUNITY)
Admission: RE | Admit: 2019-01-25 | Discharge: 2019-01-25 | Disposition: A | Discharge status: Home / Self Care | Payer: Medicare HMO | Attending: Thoracic Surgery (Cardiothoracic Vascular Surgery) | Admitting: Thoracic Surgery (Cardiothoracic Vascular Surgery)

## 2019-01-25 ENCOUNTER — Ambulatory Visit (HOSPITAL_COMMUNITY): Payer: Medicare HMO | Admitting: Vascular Surgery

## 2019-01-25 DIAGNOSIS — E785 Hyperlipidemia, unspecified: Secondary | ICD-10-CM | POA: Insufficient documentation

## 2019-01-25 DIAGNOSIS — I10 Essential (primary) hypertension: Secondary | ICD-10-CM | POA: Diagnosis not present

## 2019-01-25 DIAGNOSIS — Z880 Allergy status to penicillin: Secondary | ICD-10-CM | POA: Insufficient documentation

## 2019-01-25 DIAGNOSIS — J849 Interstitial pulmonary disease, unspecified: Secondary | ICD-10-CM | POA: Insufficient documentation

## 2019-01-25 DIAGNOSIS — R896 Abnormal cytological findings in specimens from other organs, systems and tissues: Secondary | ICD-10-CM | POA: Diagnosis not present

## 2019-01-25 DIAGNOSIS — Z7982 Long term (current) use of aspirin: Secondary | ICD-10-CM | POA: Diagnosis not present

## 2019-01-25 DIAGNOSIS — Z79899 Other long term (current) drug therapy: Secondary | ICD-10-CM | POA: Diagnosis not present

## 2019-01-25 DIAGNOSIS — C771 Secondary and unspecified malignant neoplasm of intrathoracic lymph nodes: Secondary | ICD-10-CM | POA: Diagnosis not present

## 2019-01-25 DIAGNOSIS — M1990 Unspecified osteoarthritis, unspecified site: Secondary | ICD-10-CM | POA: Insufficient documentation

## 2019-01-25 DIAGNOSIS — R590 Localized enlarged lymph nodes: Secondary | ICD-10-CM | POA: Diagnosis not present

## 2019-01-25 DIAGNOSIS — F17210 Nicotine dependence, cigarettes, uncomplicated: Secondary | ICD-10-CM | POA: Insufficient documentation

## 2019-01-25 DIAGNOSIS — J439 Emphysema, unspecified: Secondary | ICD-10-CM | POA: Insufficient documentation

## 2019-01-25 DIAGNOSIS — I2510 Atherosclerotic heart disease of native coronary artery without angina pectoris: Secondary | ICD-10-CM | POA: Diagnosis not present

## 2019-01-25 DIAGNOSIS — I739 Peripheral vascular disease, unspecified: Secondary | ICD-10-CM | POA: Diagnosis not present

## 2019-01-25 DIAGNOSIS — C3411 Malignant neoplasm of upper lobe, right bronchus or lung: Secondary | ICD-10-CM | POA: Diagnosis not present

## 2019-01-25 DIAGNOSIS — Z888 Allergy status to other drugs, medicaments and biological substances status: Secondary | ICD-10-CM | POA: Diagnosis not present

## 2019-01-25 DIAGNOSIS — E782 Mixed hyperlipidemia: Secondary | ICD-10-CM | POA: Diagnosis not present

## 2019-01-25 DIAGNOSIS — Z1159 Encounter for screening for other viral diseases: Secondary | ICD-10-CM | POA: Diagnosis not present

## 2019-01-25 DIAGNOSIS — R918 Other nonspecific abnormal finding of lung field: Secondary | ICD-10-CM | POA: Diagnosis present

## 2019-01-25 DIAGNOSIS — Z885 Allergy status to narcotic agent status: Secondary | ICD-10-CM | POA: Insufficient documentation

## 2019-01-25 DIAGNOSIS — C3401 Malignant neoplasm of right main bronchus: Secondary | ICD-10-CM | POA: Diagnosis not present

## 2019-01-25 DIAGNOSIS — R69 Illness, unspecified: Secondary | ICD-10-CM | POA: Diagnosis not present

## 2019-01-25 HISTORY — PX: VIDEO BRONCHOSCOPY WITH ENDOBRONCHIAL ULTRASOUND: SHX6177

## 2019-01-25 SURGERY — BRONCHOSCOPY, WITH EBUS
Anesthesia: General | Site: Chest

## 2019-01-25 MED ORDER — LABETALOL HCL 5 MG/ML IV SOLN
INTRAVENOUS | Status: DC | PRN
  Administered 2019-01-25 (×2): 5 mg via INTRAVENOUS

## 2019-01-25 MED ORDER — LIDOCAINE 20MG/ML (2%) 15 ML SYRINGE OPTIME
INTRAMUSCULAR | Status: DC | PRN
  Administered 2019-01-25: 40 mg via INTRAVENOUS

## 2019-01-25 MED ORDER — MIDAZOLAM HCL 2 MG/2ML IJ SOLN
INTRAMUSCULAR | Status: AC
  Filled 2019-01-25: qty 2

## 2019-01-25 MED ORDER — MIDAZOLAM HCL 5 MG/5ML IJ SOLN
INTRAMUSCULAR | Status: DC | PRN
  Administered 2019-01-25: 1 mg via INTRAVENOUS

## 2019-01-25 MED ORDER — ONDANSETRON HCL 4 MG/2ML IJ SOLN
INTRAMUSCULAR | Status: DC | PRN
  Administered 2019-01-25: 4 mg via INTRAVENOUS

## 2019-01-25 MED ORDER — SUGAMMADEX SODIUM 200 MG/2ML IV SOLN
INTRAVENOUS | Status: DC | PRN
  Administered 2019-01-25: 100 mg via INTRAVENOUS

## 2019-01-25 MED ORDER — LACTATED RINGERS IV SOLN
INTRAVENOUS | Status: DC
  Administered 2019-01-25: 10:00:00 via INTRAVENOUS

## 2019-01-25 MED ORDER — PROPOFOL 1000 MG/100ML IV EMUL
INTRAVENOUS | Status: AC
  Filled 2019-01-25: qty 100

## 2019-01-25 MED ORDER — FENTANYL CITRATE (PF) 100 MCG/2ML IJ SOLN
25.0000 ug | INTRAMUSCULAR | Status: DC | PRN
  Administered 2019-01-25: 50 ug via INTRAVENOUS

## 2019-01-25 MED ORDER — PROPOFOL 10 MG/ML IV BOLUS
INTRAVENOUS | Status: DC | PRN
  Administered 2019-01-25: 80 mg via INTRAVENOUS

## 2019-01-25 MED ORDER — SODIUM CHLORIDE 0.9 % IV SOLN
INTRAVENOUS | Status: DC | PRN
  Administered 2019-01-25: 11:00:00 25 ug/min via INTRAVENOUS

## 2019-01-25 MED ORDER — FENTANYL CITRATE (PF) 250 MCG/5ML IJ SOLN
INTRAMUSCULAR | Status: AC
  Filled 2019-01-25: qty 5

## 2019-01-25 MED ORDER — PROMETHAZINE HCL 25 MG/ML IJ SOLN: 6.2500 mg | INTRAMUSCULAR | Status: DC | PRN

## 2019-01-25 MED ORDER — MIDAZOLAM HCL 2 MG/2ML IJ SOLN: 0.5000 mg | Freq: Once | INTRAMUSCULAR | Status: DC | PRN

## 2019-01-25 MED ORDER — FENTANYL CITRATE (PF) 100 MCG/2ML IJ SOLN
INTRAMUSCULAR | Status: AC
  Filled 2019-01-25: qty 2

## 2019-01-25 MED ORDER — PROPOFOL 10 MG/ML IV BOLUS
INTRAVENOUS | Status: AC
  Filled 2019-01-25: qty 20

## 2019-01-25 MED ORDER — ONDANSETRON HCL 4 MG/2ML IJ SOLN
INTRAMUSCULAR | Status: AC
  Filled 2019-01-25: qty 2

## 2019-01-25 MED ORDER — ROCURONIUM BROMIDE 10 MG/ML (PF) SYRINGE
PREFILLED_SYRINGE | INTRAVENOUS | Status: DC | PRN
  Administered 2019-01-25: 60 mg via INTRAVENOUS

## 2019-01-25 MED ORDER — DEXAMETHASONE SODIUM PHOSPHATE 10 MG/ML IJ SOLN
INTRAMUSCULAR | Status: AC
  Filled 2019-01-25: qty 1

## 2019-01-25 MED ORDER — EPINEPHRINE PF 1 MG/ML IJ SOLN
INTRAMUSCULAR | Status: DC | PRN
  Administered 2019-01-25: 1 mg via ENDOTRACHEOPULMONARY

## 2019-01-25 MED ORDER — ALBUTEROL SULFATE HFA 108 (90 BASE) MCG/ACT IN AERS
INHALATION_SPRAY | RESPIRATORY_TRACT | Status: DC | PRN
  Administered 2019-01-25: 2 via RESPIRATORY_TRACT

## 2019-01-25 MED ORDER — FENTANYL CITRATE (PF) 100 MCG/2ML IJ SOLN
INTRAMUSCULAR | Status: DC | PRN
  Administered 2019-01-25 (×2): 25 ug via INTRAVENOUS
  Administered 2019-01-25: 100 ug via INTRAVENOUS

## 2019-01-25 MED ORDER — ROCURONIUM BROMIDE 10 MG/ML (PF) SYRINGE
PREFILLED_SYRINGE | INTRAVENOUS | Status: AC
  Filled 2019-01-25: qty 10

## 2019-01-25 MED ORDER — 0.9 % SODIUM CHLORIDE (POUR BTL) OPTIME
TOPICAL | Status: DC | PRN
  Administered 2019-01-25: 1000 mL

## 2019-01-25 MED ORDER — LIDOCAINE 2% (20 MG/ML) 5 ML SYRINGE
INTRAMUSCULAR | Status: AC
  Filled 2019-01-25: qty 5

## 2019-01-25 MED ORDER — EPINEPHRINE PF 1 MG/ML IJ SOLN
INTRAMUSCULAR | Status: AC
  Filled 2019-01-25: qty 1

## 2019-01-25 MED ORDER — DEXAMETHASONE SODIUM PHOSPHATE 10 MG/ML IJ SOLN
INTRAMUSCULAR | Status: DC | PRN
  Administered 2019-01-25: 5 mg via INTRAVENOUS

## 2019-01-25 SURGICAL SUPPLY — 40 items
ADAPTER VALVE BIOPSY EBUS (MISCELLANEOUS) IMPLANT
ADPTR VALVE BIOPSY EBUS (MISCELLANEOUS) ×2
BLADE CLIPPER SURG (BLADE) ×1 IMPLANT
BRUSH CYTOL CELLEBRITY 1.5X140 (MISCELLANEOUS) ×1 IMPLANT
CANISTER SUCT 3000ML PPV (MISCELLANEOUS) ×2 IMPLANT
CONT SPEC 4OZ CLIKSEAL STRL BL (MISCELLANEOUS) ×2 IMPLANT
COVER BACK TABLE 60X90IN (DRAPES) ×2 IMPLANT
FILTER STRAW FLUID ASPIR (MISCELLANEOUS) IMPLANT
FORCEPS BIOP RJ4 1.8 (CUTTING FORCEPS) IMPLANT
FORCEPS RADIAL JAW LRG 4 PULM (INSTRUMENTS) IMPLANT
GAUZE SPONGE 4X4 12PLY STRL (GAUZE/BANDAGES/DRESSINGS) IMPLANT
GLOVE SURG SIGNA 7.5 PF LTX (GLOVE) ×2 IMPLANT
GOWN STRL REUS W/ TWL XL LVL3 (GOWN DISPOSABLE) ×1 IMPLANT
GOWN STRL REUS W/TWL XL LVL3 (GOWN DISPOSABLE) ×2
KIT CLEAN ENDO COMPLIANCE (KITS) ×4 IMPLANT
KIT TURNOVER KIT B (KITS) ×2 IMPLANT
MARKER SKIN DUAL TIP RULER LAB (MISCELLANEOUS) ×2 IMPLANT
NDL ASPIRATION VIZISHOT 19G (NEEDLE) IMPLANT
NDL ASPIRATION VIZISHOT 21G (NEEDLE) ×1 IMPLANT
NDL BLUNT 18X1 FOR OR ONLY (NEEDLE) IMPLANT
NEEDLE ASPIRATION VIZISHOT 19G (NEEDLE) ×2 IMPLANT
NEEDLE ASPIRATION VIZISHOT 21G (NEEDLE) IMPLANT
NEEDLE BLUNT 18X1 FOR OR ONLY (NEEDLE) IMPLANT
NS IRRIG 1000ML POUR BTL (IV SOLUTION) ×2 IMPLANT
OIL SILICONE PENTAX (PARTS (SERVICE/REPAIRS)) ×2 IMPLANT
PAD ARMBOARD 7.5X6 YLW CONV (MISCELLANEOUS) ×4 IMPLANT
RADIAL JAW LRG 4 PULMONARY (INSTRUMENTS)
SYR 20ML ECCENTRIC (SYRINGE) ×2 IMPLANT
SYR 20ML LL LF (SYRINGE) ×2 IMPLANT
SYR 3ML LL SCALE MARK (SYRINGE) IMPLANT
SYR 5ML LL (SYRINGE) ×2 IMPLANT
SYR 5ML LUER SLIP (SYRINGE) ×2 IMPLANT
TOWEL GREEN STERILE (TOWEL DISPOSABLE) ×2 IMPLANT
TOWEL GREEN STERILE FF (TOWEL DISPOSABLE) ×2 IMPLANT
TRAP SPECIMEN MUCOUS 40CC (MISCELLANEOUS) ×2 IMPLANT
TUBE CONNECTING 20X1/4 (TUBING) ×2 IMPLANT
VALVE BIOPSY  SINGLE USE (MISCELLANEOUS) ×3
VALVE BIOPSY SINGLE USE (MISCELLANEOUS) ×1 IMPLANT
VALVE SUCTION BRONCHIO DISP (MISCELLANEOUS) ×2 IMPLANT
WATER STERILE IRR 1000ML POUR (IV SOLUTION) ×2 IMPLANT

## 2019-01-25 NOTE — Brief Op Note (Addendum)
01/25/2019  11:48 AM  PATIENT:  Stacy [name]  73 y.o. female  PRE-OPERATIVE DIAGNOSIS:  1. RUL mass 2. MEDIASTINAL ADENOPATHY  POST-OPERATIVE DIAGNOSIS:   1. RUL mass 2. MEDIASTINAL ADENOPATHY  PROCEDURE:  Procedure(s): VIDEO BRONCHOSCOPY WITH ENDOBRONCHIAL ULTRASOUND (N/A)  SURGEON:  Surgeon(s) and Role:    * Melrose Nakayama, MD - Primary  PHYSICIAN ASSISTANT:   ASSISTANTS: none   ANESTHESIA:   general  EBL:  10 mL   BLOOD ADMINISTERED:none  DRAINS: none   LOCAL MEDICATIONS USED:  NONE  SPECIMEN:  Source of Specimen:  mediastinal nodes, right mainstem bronchus  DISPOSITION OF SPECIMEN:  PATHOLOGY  COUNTS:  NO endoscopic  TOURNIQUET:  * No tourniquets in log *  DICTATION: .Other Dictation: Dictation Number -#[id number]  PLAN OF CARE: Discharge to home after PACU  PATIENT DISPOSITION:  PACU - hemodynamically stable.   Delay start of Pharmacological VTE agent (>24hrs) due to surgical blood loss or risk of bleeding: not applicable  FINDINGS-  1. Markedly enlarged mediastinal nodes- aspirations + poorly differentiated carcinoma 2. Endobronchial mass lesion originating at RUL orifice and extending into right mainstem

## 2019-01-25 NOTE — Anesthesia Procedure Notes (Signed)
Procedure Name: Intubation Date/Time: 01/25/2019 11:05 AM Performed by: Lowella Dell, CRNA Pre-anesthesia Checklist: Patient identified, Emergency Drugs available, Suction available and Patient being monitored Patient Re-evaluated:Patient Re-evaluated prior to induction Oxygen Delivery Method: Circle System Utilized Preoxygenation: Pre-oxygenation with 100% oxygen Induction Type: IV induction Ventilation: Mask ventilation without difficulty Laryngoscope Size: Mac and 3 (DLx2 by SRNA) Grade View: Grade I Tube type: Oral Tube size: 8.0 mm Number of attempts: 1 Airway Equipment and Method: Stylet Placement Confirmation: ETT inserted through vocal cords under direct vision,  positive ETCO2 and breath sounds checked- equal and bilateral Secured at: 21 cm Tube secured with: Tape Dental Injury: Teeth and Oropharynx as per pre-operative assessment

## 2019-01-25 NOTE — Discharge Instructions (Signed)
Do not drive or engage in heavy physical activity for 24 hours  You may resume normal activities tomorrow  You may cough up small amounts of blood over the next few days  You may use acetaminophen (Tylenol) if needed for discomfort.  Call [phone] if you develop chest pain, shortness of breath, fever > 101 F or cough up more than 2 tablespoons of blood  Call Dr. Julien Nordmann for follow up

## 2019-01-25 NOTE — Anesthesia Postprocedure Evaluation (Signed)
Anesthesia Post Note  Patient: Stacy [name]  Procedure(s) Performed: VIDEO BRONCHOSCOPY WITH ENDOBRONCHIAL ULTRASOUND (N/A Chest)     Patient location during evaluation: PACU Anesthesia Type: General Level of consciousness: awake and alert, patient cooperative and oriented Pain management: pain level controlled (pt states no pain) Vital Signs Assessment: post-procedure vital signs reviewed and stable Respiratory status: spontaneous breathing, nonlabored ventilation and respiratory function stable Cardiovascular status: blood pressure returned to baseline and stable Postop Assessment: no apparent nausea or vomiting, adequate PO intake and able to ambulate Anesthetic complications: no    Last Vitals:  Vitals:   01/25/19 1306 01/25/19 1307  BP: 136/69   Pulse: 86 85  Resp: 17 18  Temp: 36.4 C   SpO2: 92% 93%    Last Pain:  Vitals:   01/25/19 1218  TempSrc:   PainSc: 9                  Janille Draughon,E. Shantika Bermea

## 2019-01-25 NOTE — Transfer of Care (Signed)
Immediate Anesthesia Transfer of Care Note  Patient: Stacy [name]  Procedure(s) Performed: VIDEO BRONCHOSCOPY WITH ENDOBRONCHIAL ULTRASOUND (N/A Chest)  Patient Location: PACU  Anesthesia Type:General  Level of Consciousness: awake, alert , oriented and patient cooperative  Airway & Oxygen Therapy: Patient Spontanous Breathing and Patient connected to face mask oxygen  Post-op Assessment: Report given to RN, Post -op Vital signs reviewed and stable and Patient moving all extremities X 4  Post vital signs: Reviewed and stable  Last Vitals:  Vitals Value Taken Time  BP    Temp    Pulse 85 01/25/19 1204  Resp 24 01/25/19 1204  SpO2 96 % 01/25/19 1204  Vitals shown include unvalidated device data.  Last Pain:  Vitals:   01/25/19 0955  TempSrc:   PainSc: 0-No pain         Complications: No apparent anesthesia complications

## 2019-01-25 NOTE — Interval H&P Note (Signed)
History and Physical Interval Note:  01/25/2019 9:59 AM  Stacy [name]  has presented today for surgery, with the diagnosis of RUL  MEDIASTINAL ADENOPATHY.  The various methods of treatment have been discussed with the patient and family. After consideration of risks, benefits and other options for treatment, the patient has consented to  Procedure(s): Turner (N/A) as a surgical intervention.  The patient's history has been reviewed, patient examined, no change in status, stable for surgery.  I have reviewed the patient's chart and labs.  Questions were answered to the patient's satisfaction.     Melrose Nakayama

## 2019-01-25 NOTE — Op Note (Signed)
NAME: Stacy [name], Stacy [name] MEDICAL RECORD [id number] ACCOUNT [id number] DATE OF BIRTH:1945/09/19 FACILITY: [facility] LOCATION: [facility] PHYSICIAN:Hero Kulish C. Zaryan Yakubov, MD  OPERATIVE REPORT  DATE OF PROCEDURE:  01/25/2019  PREOPERATIVE DIAGNOSIS:  Right upper lobe mass with mediastinal adenopathy, suspected stage IIIA lung cancer.  POSTOPERATIVE DIAGNOSIS:  Poorly differentiated carcinoma, clinical stage IIIB (T4, N2).  PROCEDURE:  Bronchoscopy with brushings and endobronchial biopsies and endobronchial ultrasound with mediastinal lymph node aspirations.  SURGEON:  Modesto Charon, MD  ASSISTANT:  None.  ANESTHESIA:  General.  FINDINGS:  Markedly enlarged mediastinal lymph nodes.  Aspirations from 4R node showed poorly differentiated carcinoma, right upper lobe mass extending into the right main stem bronchus, right upper lobe orifice was not visualized.  Brushings showed  poorly differentiated carcinoma.  CLINICAL NOTE:  This is a 73 year old woman with a history of tobacco abuse who had a low-dose screening CT of the chest done due to her smoking.  It revealed a right upper lobe mass and right hilar and mediastinal adenopathy.  She also had coronary and  aortic atherosclerosis.  A PET CT showed the right upper lobe masses and hilar mediastinal adenopathy were markedly hypermetabolic.  Findings were consistent with stage IIIA (T2, N2) nonsmall cell carcinoma.  She was advised to undergo bronchoscopy and  endobronchial ultrasound for diagnostic and staging purposes.  The indications, risks, benefits, and alternatives were discussed in detail with the patient.  She understood and accepted the risks and agreed to proceed.  OPERATIVE NOTE:  The patient was brought to the operating room on 01/25/2019.  She had induction of general anesthesia and was intubated.  A timeout was performed.  Flexible fiberoptic bronchoscopy was performed via the endotracheal tube.  There was  extensive  involvement of the right main stem and right upper lobe orifice with tumor.  The right upper lobe airways were not visible.  The bronchus intermedius, middle and lower lobe bronchi appeared to be within normal limits.  There were no  endobronchial lesions on the left side.  The bronchoscope was removed.  The endobronchial ultrasound probe then was advanced massively enlarged level 7 and 4R nodes were identified.  Aspirations were performed from each of these nodes with ultrasound visualization.  A 19-gauge needle was used.  Samples were obtained, both  with and without suction applied.  There was some mild bleeding, which cleared with saline irrigation.  The endobronchial ultrasound probe was removed.  The bronchoscope was replaced and brushings were performed of the mass in the right main stem bronchus at the takeoff of the right upper lobe bronchus.  The aspirations from the 4R node and brushings both showed poorly differentiated carcinoma.  Multiple  biopsies were obtained.  There was some bleeding.  Dilute epinephrine was applied, which helped control the bleeding.  Despite extensive biopsies, no right upper lobe airway can be reestablished.  The biopsies were all sent for permanent pathology.   After several minutes, the bronchoscope was reinserted.  A final inspection was made.  There was no ongoing bleeding.  The bronchoscope was removed.  The patient was extubated in the operating room and taken to the [facility] in good  condition.  TN/NUANCE  D:01/25/2019 T:01/25/2019 JOB:[id number]

## 2019-01-26 ENCOUNTER — Encounter (HOSPITAL_COMMUNITY): Payer: Self-pay | Admitting: Thoracic Surgery (Cardiothoracic Vascular Surgery)

## 2019-01-26 ENCOUNTER — Other Ambulatory Visit: Payer: Self-pay | Admitting: *Deleted

## 2019-01-26 DIAGNOSIS — C3411 Malignant neoplasm of upper lobe, right bronchus or lung: Secondary | ICD-10-CM

## 2019-01-26 LAB — SURGICAL PATHOLOGY

## 2019-01-26 LAB — CYTOLOGY - NON PAP

## 2019-01-29 ENCOUNTER — Inpatient Hospital Stay: Payer: Medicare HMO

## 2019-01-29 ENCOUNTER — Encounter: Payer: Self-pay | Admitting: Internal Medicine

## 2019-01-29 ENCOUNTER — Telehealth: Payer: Self-pay | Admitting: *Deleted

## 2019-01-29 ENCOUNTER — Inpatient Hospital Stay (HOSPITAL_BASED_OUTPATIENT_CLINIC_OR_DEPARTMENT_OTHER): Payer: Medicare HMO | Admitting: Internal Medicine

## 2019-01-29 ENCOUNTER — Encounter: Payer: Self-pay | Admitting: *Deleted

## 2019-01-29 ENCOUNTER — Other Ambulatory Visit: Payer: Self-pay

## 2019-01-29 DIAGNOSIS — C3411 Malignant neoplasm of upper lobe, right bronchus or lung: Secondary | ICD-10-CM

## 2019-01-29 DIAGNOSIS — M8580 Other specified disorders of bone density and structure, unspecified site: Secondary | ICD-10-CM | POA: Diagnosis not present

## 2019-01-29 DIAGNOSIS — Z7189 Other specified counseling: Secondary | ICD-10-CM | POA: Diagnosis not present

## 2019-01-29 DIAGNOSIS — Z5111 Encounter for antineoplastic chemotherapy: Secondary | ICD-10-CM | POA: Insufficient documentation

## 2019-01-29 DIAGNOSIS — Z7982 Long term (current) use of aspirin: Secondary | ICD-10-CM | POA: Diagnosis not present

## 2019-01-29 DIAGNOSIS — Z801 Family history of malignant neoplasm of trachea, bronchus and lung: Secondary | ICD-10-CM | POA: Diagnosis not present

## 2019-01-29 DIAGNOSIS — C3481 Malignant neoplasm of overlapping sites of right bronchus and lung: Secondary | ICD-10-CM | POA: Insufficient documentation

## 2019-01-29 DIAGNOSIS — R918 Other nonspecific abnormal finding of lung field: Secondary | ICD-10-CM | POA: Diagnosis not present

## 2019-01-29 DIAGNOSIS — J439 Emphysema, unspecified: Secondary | ICD-10-CM | POA: Diagnosis not present

## 2019-01-29 DIAGNOSIS — E785 Hyperlipidemia, unspecified: Secondary | ICD-10-CM | POA: Diagnosis not present

## 2019-01-29 DIAGNOSIS — R69 Illness, unspecified: Secondary | ICD-10-CM | POA: Diagnosis not present

## 2019-01-29 DIAGNOSIS — I10 Essential (primary) hypertension: Secondary | ICD-10-CM | POA: Diagnosis not present

## 2019-01-29 DIAGNOSIS — Z79899 Other long term (current) drug therapy: Secondary | ICD-10-CM | POA: Diagnosis not present

## 2019-01-29 LAB — CMP (CANCER CENTER ONLY)
ALT: 7 U/L (ref 0–44)
AST: 15 U/L (ref 15–41)
Albumin: 4 g/dL (ref 3.5–5.0)
Alkaline Phosphatase: 77 U/L (ref 38–126)
Anion gap: 7 (ref 5–15)
BUN: 10 mg/dL (ref 8–23)
CO2: 28 mmol/L (ref 22–32)
Calcium: 10.4 mg/dL — ABNORMAL HIGH (ref 8.9–10.3)
Chloride: 100 mmol/L (ref 98–111)
Creatinine: 0.86 mg/dL (ref 0.44–1.00)
GFR, Est AFR Am: >60 mL/min (ref >60)
GFR, Estimated: >60 mL/min (ref >60)
Glucose, Bld: 113 mg/dL — ABNORMAL HIGH (ref 70–99)
Potassium: 4.3 mmol/L (ref 3.5–5.1)
Sodium: 135 mmol/L (ref 135–145)
Total Bilirubin: 0.4 mg/dL (ref 0.3–1.2)
Total Protein: 7.8 g/dL (ref 6.5–8.1)

## 2019-01-29 LAB — CBC WITH DIFFERENTIAL (CANCER CENTER ONLY)
Abs Immature Granulocytes: 0.02 10*3/uL (ref 0.00–0.07)
Basophils Absolute: 0.1 10*3/uL (ref 0.0–0.1)
Basophils Relative: 1 %
Eosinophils Absolute: 0.2 10*3/uL (ref 0.0–0.5)
Eosinophils Relative: 3 %
HCT: 42.6 % (ref 36.0–46.0)
Hemoglobin: 14.4 g/dL (ref 12.0–15.0)
Immature Granulocytes: 0 %
Lymphocytes Relative: 31 %
Lymphs Abs: 2.4 10*3/uL (ref 0.7–4.0)
MCH: 33 pg (ref 26.0–34.0)
MCHC: 33.8 g/dL (ref 30.0–36.0)
MCV: 97.7 fL (ref 80.0–100.0)
Monocytes Absolute: 0.5 10*3/uL (ref 0.1–1.0)
Monocytes Relative: 6 %
Neutro Abs: 4.6 10*3/uL (ref 1.7–7.7)
Neutrophils Relative %: 59 %
Platelet Count: 262 10*3/uL (ref 150–400)
RBC: 4.36 MIL/uL (ref 3.87–5.11)
RDW: 12.3 % (ref 11.5–15.5)
WBC Count: 7.7 10*3/uL (ref 4.0–10.5)
nRBC: 0 % (ref 0.0–0.2)

## 2019-01-29 NOTE — Progress Notes (Signed)
[place] [phone]   [phone]  OFFICE PROGRESS NOTE  [name], MD [address] [place] [address]  DIAGNOSIS: Limited stage (T3, N3, M0) small cell lung cancer presented with right upper lobe lung mass in addition to satellite nodule as well as right hilar, mediastinal and right supraclavicular lymphadenopathy diagnosed in September 2020.  PRIOR THERAPY: None.  CURRENT THERAPY: None  INTERVAL HISTORY: Stacy [name] 73 y.o. female returns to the clinic today for follow-up visit.  The patient is feeling fine today with no concerning complaints except for occasional pain on the right side of the chest with shortness of breath with exertion.  She denied having any cough or hemoptysis.  She has no nausea, vomiting, diarrhea or constipation.  She denied having any fever or chills.  She has no recent weight loss or night sweats.  She had MRI of the brain performed recently that showed no evidence for metastatic disease to the brain.  The patient was also seen by Dr. Roxan Hockey and she underwent bronchoscopy with endobronchial ultrasound and biopsies and the final pathology was consistent with small cell lung cancer.  She is here today for evaluation and discussion of her treatment options.  MEDICAL HISTORY: Past Medical History:  Diagnosis Date   Carotid bruit    Bilateral   Chest pain    occasionally, takes nitro if needed, at rest (1-2 times a month)   Colon polyps    with divertirticulitis   COPD (chronic obstructive pulmonary disease) (HCC)    DJD (degenerative joint disease)    Emphysema lung (HCC)    Family history of adverse reaction to anesthesia    mom PONV   Hyperlipidemia    Hypertension    Lymph node cancer (HCC)    Osteopenia    Prediabetes    Vitamin D deficiency     ALLERGIES:  is allergic to dilaudid [hydromorphone hcl]; fentanyl; penicillins; and crestor  [rosuvastatin].  MEDICATIONS:  Current Outpatient Medications  Medication Sig Dispense Refill   albuterol (PROAIR HFA) 108 (90 Base) MCG/ACT inhaler Inhale 2 puffs into the lungs every 6 (six) hours as needed for wheezing or shortness of breath. 18 g 1   ALPRAZolam (XANAX) 0.5 MG tablet TAKE 1/2 TO 1 (ONE-HALF TO ONE) TABLET BY MOUTH TWICE DAILY AS NEEDED FOR ANXIETY OR&nbsp;&nbsp;SLEEP 60 tablet 2   aspirin 81 MG tablet Take 162 mg by mouth daily.      atenolol (TENORMIN) 25 MG tablet Take 1 tablet Daily for BP (Patient taking differently: Take 25 mg by mouth daily. ) 90 tablet 3   cholecalciferol (VITAMIN D) 1000 units tablet Take 1,000 Units by mouth daily.      diphenhydramine-acetaminophen (TYLENOL PM) 25-500 MG TABS tablet Take 1 tablet by mouth at bedtime as needed (sleep).      ezetimibe (ZETIA) 10 MG tablet TAKE 1 TABLET BY MOUTH ONCE DAILY FOR CHOLESTEROL (Patient taking differently: Take 10 mg by mouth daily. ) 90 tablet 0   ibuprofen (ADVIL) 200 MG tablet Take 600 mg by mouth every 6 (six) hours as needed for headache or mild pain.     nitroGLYCERIN (NITROSTAT) 0.4 MG SL tablet Place 1 tablet (0.4 mg total) under the tongue every 5 (five) minutes as needed for chest pain. 30 tablet 3   No current facility-administered medications for this visit.     SURGICAL HISTORY:  Past Surgical History:  Procedure Laterality Date   CATARACT  EXTRACTION Bilateral 03/17/2017   COLONOSCOPY     ESOPHAGOGASTRODUODENOSCOPY     HAND SURGERY     head injury  1995   LAMINECTOMY  2002   L 5    NECK SURGERY  2012    c4 -5/6  fushion   TONSILLECTOMY     TRIGGER FINGER RELEASE     VIDEO BRONCHOSCOPY WITH ENDOBRONCHIAL ULTRASOUND N/A 01/25/2019   Procedure: VIDEO BRONCHOSCOPY WITH ENDOBRONCHIAL ULTRASOUND;  Surgeon: Melrose Nakayama, MD;  Location: [facility];  Service: Thoracic;  Laterality: N/A;    REVIEW OF SYSTEMS:  Constitutional: positive for fatigue Eyes: negative Ears,  nose, mouth, throat, and face: negative Respiratory: positive for dyspnea on exertion and pleurisy/chest pain Cardiovascular: negative Gastrointestinal: negative Genitourinary:negative Integument/breast: negative Hematologic/lymphatic: negative Musculoskeletal:negative Neurological: negative Behavioral/Psych: negative Endocrine: negative Allergic/Immunologic: negative   PHYSICAL EXAMINATION: General appearance: alert, cooperative, fatigued and no distress Head: Normocephalic, without obvious abnormality, atraumatic Neck: no adenopathy, no JVD, supple, symmetrical, trachea midline and thyroid not enlarged, symmetric, no tenderness/mass/nodules Lymph nodes: Cervical, supraclavicular, and axillary nodes normal. Resp: clear to auscultation bilaterally Back: symmetric, no curvature. ROM normal. No CVA tenderness. Cardio: regular rate and rhythm, S1, S2 normal, no murmur, click, rub or gallop GI: soft, non-tender; bowel sounds normal; no masses,  no organomegaly Extremities: extremities normal, atraumatic, no cyanosis or edema Neurologic: Alert and oriented X 3, normal strength and tone. Normal symmetric reflexes. Normal coordination and gait  ECOG PERFORMANCE STATUS: 1 - Symptomatic but completely ambulatory  Blood pressure 135/84, pulse 76, temperature 98.3 F (36.8 C), temperature source Temporal, resp. rate 16, height 5\' 2"  (1.575 m), weight 115 lb 3.2 oz (52.3 kg), SpO2 97 %.  LABORATORY DATA: Lab Results  Component Value Date   WBC 7.7 01/29/2019   HGB 14.4 01/29/2019   HCT 42.6 01/29/2019   MCV 97.7 01/29/2019   PLT 262 01/29/2019      Chemistry      Component Value Date/Time   NA 131 (L) 01/22/2019 1420   K 4.1 01/22/2019 1420   CL 95 (L) 01/22/2019 1420   CO2 24 01/22/2019 1420   BUN 7 (L) 01/22/2019 1420   CREATININE 0.80 01/22/2019 1420   CREATININE 0.85 01/11/2019 1320   CREATININE 0.80 11/27/2018 1522      Component Value Date/Time   CALCIUM 9.8 01/22/2019  1420   ALKPHOS 81 01/22/2019 1420   AST 20 01/22/2019 1420   AST 16 01/11/2019 1320   ALT 10 01/22/2019 1420   ALT 8 01/11/2019 1320   BILITOT 0.3 01/22/2019 1420   BILITOT <0.2 (L) 01/11/2019 1320       RADIOGRAPHIC STUDIES: Dg Chest 2 View  Result Date: 01/23/2019 CLINICAL DATA:  Preoperative exam for bronchoscopy. EXAM: CHEST - 2 VIEW COMPARISON:  CT 12/11/2018.  Chest x-ray 04/01/2017. FINDINGS: Mediastinal and right hilar mass. Right upper lobe mass. Similar findings noted on recent CT. Chronic interstitial prominence again noted. No focal alveolar infiltrate. No pleural effusion or pneumothorax. Heart size normal. Degenerative change scoliosis thoracic spine. IMPRESSION: 1. Mediastinum and right hilar mass. Right upper lobe mass. Similar findings noted on recent CT and again are again consistent with malignancy. 2.  Chronic interstitial lung disease. Electronically Signed   By: Marcello Moores  Register   On: 01/23/2019 07:00   Mr Brain W Wo Contrast  Result Date: 01/15/2019 CLINICAL DATA:  New diagnosis non-small cell lung cancer.  Staging. EXAM: MRI HEAD WITHOUT AND WITH CONTRAST TECHNIQUE: Multiplanar, multiecho pulse sequences of the brain  and surrounding structures were obtained without and with intravenous contrast. CONTRAST:  70mL GADAVIST GADOBUTROL 1 MMOL/ML IV SOLN COMPARISON:  Head CT 01/20/2018 FINDINGS: Brain: Diffusion imaging does not show any acute or subacute infarction or other cause of restricted diffusion. There are moderate changes of chronic small vessel disease affecting the pons and cerebral hemispheric white matter. No cortical or large vessel territory infarction. No evidence of primary or metastatic mass lesion. No abnormal enhancement occurs. No hydrocephalus or extra-axial collection. Vascular: Major vessels at the base of the brain show flow. Skull and upper cervical spine: Negative Sinuses/Orbits: Clear/normal Other: None IMPRESSION: No evidence of metastatic disease.  Moderate chronic small-vessel ischemic changes. Electronically Signed   By: Nelson Chimes M.D.   On: 01/15/2019 16:27   Nm Pet Image Initial (pi) Skull Base To Thigh  Result Date: 01/03/2019 CLINICAL DATA:  Initial treatment strategy for non-small-cell lung cancer. EXAM: NUCLEAR MEDICINE PET SKULL BASE TO THIGH TECHNIQUE: 6.0 mCi F-18 FDG was injected intravenously. Full-ring PET imaging was performed from the skull base to thigh after the radiotracer. CT data was obtained and used for attenuation correction and anatomic localization. Fasting blood glucose: 122 mg/dl COMPARISON:  Lung cancer screening chest CT 12/11/2018 FINDINGS: Mediastinal blood pool activity: SUV max 2.5 Liver activity: SUV max NA NECK: Small hypermetabolic right level II lymph node measures 7 mm short axis (image 26/series 4) with SUV max = 3.6. No other hypermetabolic lymphadenopathy in the neck. Incidental CT findings: None. CHEST: 8 mm short axis high right paratracheal node (69/4) is hypermetabolic. Bulky 2.8 cm short axis precarinal lymph node is hypermetabolic with SUV max = 85.4. Conglomerate adenopathy in the right hilum is markedly hypermetabolic with SUV max = 62.7. The peripheral right upper lobe lung mass measured at 2.2 x 3.8 cm on previous lung cancer screening CT is hypermetabolic with SUV max = 03.5. 13 mm right upper lobe nodule between the dominant peripheral mass and the hilum is hypermetabolic with SUV max = 00.9 No left hilar hypermetabolic metastases. No evidence for hypermetabolic lymphadenopathy in the hilar regions. Incidental CT findings: noneAtherosclerotic calcification is noted in the wall of the thoracic aorta. Coronary artery calcification is evident. Centrilobular and paraseptal emphysema evident. No evidence of pleural effusion. ABDOMEN/PELVIS: No abnormal hypermetabolic activity within the liver, pancreas, adrenal glands, or spleen. No hypermetabolic lymph nodes in the abdomen or pelvis. Incidental CT  findings: Abdominal aortic atherosclerosis noted with fusiform distention of the infrarenal abdominal aorta up to 2.5 cm maximum diameter. SKELETON: No focal hypermetabolic activity to suggest skeletal metastasis. Incidental CT findings: none IMPRESSION: 1. Peripheral right upper lobe pulmonary mass is markedly hypermetabolic with adjacent hypermetabolic satellite nodules and hypermetabolic right hilar and mediastinal lymphadenopathy. Small right cervical lymph node demonstrates hypermetabolism, also suspicious for metastatic involvement. 2. No evidence for hypermetabolic metastatic disease in the abdomen or pelvis. 3.  Aortic Atherosclerois (ICD10-170.0) 4.  Emphysema. ([id number]) Electronically Signed   By: Misty Stanley M.D.   On: 01/03/2019 14:19    ASSESSMENT AND PLAN: This is a very pleasant 73 years old white female with limited stage small cell lung cancer diagnosed in September 2020 and presented with right upper lobe lung mass in addition to satellite nodule as well as right hilar, mediastinal and right supraclavicular lymphadenopathy. The patient had MRI of the brain performed recently that was negative for metastatic disease to the brain.  She also underwent bronchoscopy with EBUS with endobronchial biopsy and the final pathology was consistent with a small  cell lung cancer. I had a lengthy discussion with the patient today about her current disease stage, prognosis and treatment options.  I explained to the patient that she has incurable condition with a 5-year survival of 25%. I recommended for the patient treatment with the platinum based therapy probably with carboplatin for AUC of 5 on day 1 and 2 etoposide 120 mg/M2 on days 1, 2 and 3 every 3 weeks for a total of 4-6 cycles concurrent with radiation. I discussed with the patient the adverse effect of this treatment including but not limited to alopecia, myelosuppression, nausea and vomiting, peripheral neuropathy, liver or renal  dysfunction. The patient would like to think about this option and discuss it with her daughter before making a decision.  She also asked me to call her daughter with the treatment plan. She will call us in the next few days with her final decision. If the patient decided to proceed with the treatment, we can arrange this to start within 1 week of her decision. She was advised to call immediately if she has any concerning symptoms in the interval. The patient voices understanding of current disease status and treatment options and is in agreement with the current care plan.  All questions were answered. The patient knows to call the clinic with any problems, questions or concerns. We can certainly see the patient much sooner if necessary.  I spent 15 minutes counseling the patient face to face. The total time spent in the appointment was 25 minutes.  Disclaimer: This note was dictated with voice recognition software. Similar sounding words can inadvertently be transcribed and may not be corrected upon review.

## 2019-01-29 NOTE — Telephone Encounter (Signed)
Oncology Nurse Navigator Documentation  Oncology Nurse Navigator Flowsheets 01/29/2019  Navigator Location [facility]  Referral Date to RadOnc/MedOnc -  Navigator Encounter Type Telephone/I called to followed up with Ms. Stacy [name] today.  She states she has an appt today at 49.  I clarified appt.   Telephone Outgoing Call  [facility] Date -  Patient Visit Type -  Treatment Phase Pre-Tx/Tx Discussion  Barriers/Navigation Needs Coordination of Care  Education -  Interventions Coordination of Care  Coordination of Care -  Education Method -  Acuity Level 1-No Barriers  Time Spent with Patient 15

## 2019-01-29 NOTE — Progress Notes (Signed)
Oncology Nurse Navigator Documentation  Oncology Nurse Navigator Flowsheets 01/29/2019  Diagnosis Status Confirmed Diagnosis Complete  Navigator Follow Up Date: 02/02/2019  Navigator Follow Up Reason: Follow-up Appointment  Navigator Location [facility]  Referral Date to RadOnc/MedOnc -  Navigator Encounter Type Clinic/MDC/I spoke with Stacy [name] today.  She is new dx with small cel lung cancer limited stage.  She is not sure if she wants treatment.  I spoke with her and gave her educational material to help her understand plan of care.  She would like some time to think about options.    Telephone -  Abnormal Finding Date 12/11/2018  Confirmed Diagnosis Date 01/25/2019  Multidisiplinary Clinic Date -  Patient Visit Type MedOnc  Treatment Phase Pre-Tx/Tx Discussion  Barriers/Navigation Needs Education  Education Newly Diagnosed Cancer Education;Smoking cessation;Other  Interventions Education  Coordination of Care -  Education Method Verbal;Written  Acuity Level 2-Minimal Needs (1-2 Barriers Identified)  Time Spent with Patient 45

## 2019-01-30 ENCOUNTER — Telehealth: Payer: Self-pay | Admitting: *Deleted

## 2019-01-30 NOTE — Telephone Encounter (Signed)
Oncology Nurse Navigator Documentation  Oncology Nurse Navigator Flowsheets 01/30/2019  Diagnosis Status -  Navigator Follow Up Date: -  Navigator Follow Up Reason: -  Navigator Location [facility]  Referral Date to RadOnc/MedOnc -  Navigator Encounter Type Telephone/I received a message that patient needed me to call her.  I called and spoke with patient. She is wanting to start treatment.  I updated Dr. Lisbeth Renshaw and Dr. Julien Nordmann.    Telephone Outgoing Call  Abnormal Finding Date -  Confirmed Diagnosis Date -  Multidisiplinary Clinic Date -  Patient Visit Type -  Treatment Phase Pre-Tx/Tx Discussion  Barriers/Navigation Needs Coordination of Care;Education  Education Other  Interventions Coordination of Care;Education  Coordination of Care -  Education Method Verbal  Acuity Level 2-Minimal Needs (1-2 Barriers Identified)  Time Spent with Patient 30

## 2019-01-31 ENCOUNTER — Telehealth: Payer: Self-pay | Admitting: Radiation Oncology

## 2019-01-31 NOTE — Telephone Encounter (Signed)
I called the patient to let her know that we were aware of her decision to move forward with chemoradiation for her newly diagnosed small cell lung cancer.  She will come in for simulation on Friday this week, with the intentions of starting her treatment on 02/12/2019.  She is in agreement with this plan.

## 2019-02-01 ENCOUNTER — Other Ambulatory Visit: Payer: Self-pay | Admitting: *Deleted

## 2019-02-01 NOTE — Progress Notes (Signed)
The proposed treatment discussed in cancer conference 02/01/19 is for discussion purpose only and is not a binding recommendation.  The patient was not physically examined nor present for their treatment options.  Therefore, final treatment plans cannot be decided.

## 2019-02-02 ENCOUNTER — Telehealth: Payer: Self-pay | Admitting: Internal Medicine

## 2019-02-02 ENCOUNTER — Other Ambulatory Visit: Payer: Self-pay

## 2019-02-02 ENCOUNTER — Ambulatory Visit
Admission: RE | Admit: 2019-02-02 | Discharge: 2019-02-02 | Disposition: A | Discharge status: Home / Self Care | Payer: Medicare HMO | Source: Ambulatory Visit | Attending: Radiation Oncology | Admitting: Radiation Oncology

## 2019-02-02 ENCOUNTER — Other Ambulatory Visit: Payer: Self-pay | Admitting: Internal Medicine

## 2019-02-02 DIAGNOSIS — Z510 Encounter for antineoplastic radiation therapy: Secondary | ICD-10-CM | POA: Diagnosis not present

## 2019-02-02 DIAGNOSIS — C3411 Malignant neoplasm of upper lobe, right bronchus or lung: Secondary | ICD-10-CM | POA: Diagnosis not present

## 2019-02-02 DIAGNOSIS — C3481 Malignant neoplasm of overlapping sites of right bronchus and lung: Secondary | ICD-10-CM

## 2019-02-02 MED ORDER — PROCHLORPERAZINE MALEATE 10 MG PO TABS: 10.0000 mg | ORAL_TABLET | Freq: Four times a day (QID) | ORAL | 0 refills | Status: DC | PRN

## 2019-02-02 NOTE — Telephone Encounter (Signed)
Scheduled appt per 10/2 sch message  - pt is aware of appt date and time

## 2019-02-02 NOTE — Progress Notes (Signed)
START ON PATHWAY REGIMEN - Small Cell Lung     A cycle is every 21 days:     Etoposide      Carboplatin   **Always confirm dose/schedule in your pharmacy ordering system**  Patient Characteristics: Newly Diagnosed, Preoperative or Nonsurgical Candidate (Clinical Staging), First Line, Limited Stage, Nonsurgical Candidate Therapeutic Status: Newly Diagnosed, Preoperative or Nonsurgical Candidate (Clinical Staging) AJCC T Category: cT3 AJCC N Category: cN3 AJCC M Category: cM0 AJCC 8 Stage Grouping: IIIC Stage Classification: Limited Surgical Candidacy: Nonsurgical Candidate Intent of Therapy: Curative Intent, Discussed with Patient

## 2019-02-06 ENCOUNTER — Encounter: Payer: Self-pay | Admitting: Physician Assistant

## 2019-02-06 ENCOUNTER — Telehealth: Payer: Self-pay | Admitting: Internal Medicine

## 2019-02-06 ENCOUNTER — Other Ambulatory Visit: Payer: Self-pay

## 2019-02-06 ENCOUNTER — Inpatient Hospital Stay: Payer: Medicare HMO

## 2019-02-06 NOTE — Progress Notes (Signed)
Met with patient at lobby to introduce myself as Arboriculturist and to offer available resources.  Discussed one-time $11 Engineer, drilling to assist with personal expenses while going through treatment.  Gave her my card if interested in applying and for any additional financial questions or concerns.

## 2019-02-06 NOTE — Telephone Encounter (Signed)
Called patient regarding 10/06 appointment, per patient's request appointment has been moved to 10/08.

## 2019-02-07 ENCOUNTER — Telehealth: Payer: Self-pay | Admitting: Medical Oncology

## 2019-02-07 NOTE — Telephone Encounter (Signed)
Asking if insurance form is ready . She brought it in 2 weeks ago and wants to pick it up tomorrow. She has chemo education appt tomorrow .

## 2019-02-08 ENCOUNTER — Other Ambulatory Visit: Payer: Self-pay

## 2019-02-08 ENCOUNTER — Inpatient Hospital Stay: Payer: Medicare HMO | Attending: Internal Medicine

## 2019-02-08 ENCOUNTER — Encounter: Payer: Self-pay | Admitting: Internal Medicine

## 2019-02-08 ENCOUNTER — Encounter: Payer: Self-pay | Admitting: *Deleted

## 2019-02-08 DIAGNOSIS — E785 Hyperlipidemia, unspecified: Secondary | ICD-10-CM | POA: Insufficient documentation

## 2019-02-08 DIAGNOSIS — Z5111 Encounter for antineoplastic chemotherapy: Secondary | ICD-10-CM | POA: Insufficient documentation

## 2019-02-08 DIAGNOSIS — C3411 Malignant neoplasm of upper lobe, right bronchus or lung: Secondary | ICD-10-CM | POA: Insufficient documentation

## 2019-02-08 DIAGNOSIS — Z79899 Other long term (current) drug therapy: Secondary | ICD-10-CM | POA: Insufficient documentation

## 2019-02-08 DIAGNOSIS — Z7982 Long term (current) use of aspirin: Secondary | ICD-10-CM | POA: Insufficient documentation

## 2019-02-08 DIAGNOSIS — Z791 Long term (current) use of non-steroidal anti-inflammatories (NSAID): Secondary | ICD-10-CM | POA: Insufficient documentation

## 2019-02-08 DIAGNOSIS — J449 Chronic obstructive pulmonary disease, unspecified: Secondary | ICD-10-CM | POA: Insufficient documentation

## 2019-02-08 DIAGNOSIS — I10 Essential (primary) hypertension: Secondary | ICD-10-CM | POA: Insufficient documentation

## 2019-02-08 DIAGNOSIS — M8580 Other specified disorders of bone density and structure, unspecified site: Secondary | ICD-10-CM | POA: Insufficient documentation

## 2019-02-08 DIAGNOSIS — Z5189 Encounter for other specified aftercare: Secondary | ICD-10-CM | POA: Insufficient documentation

## 2019-02-08 NOTE — Progress Notes (Signed)
Met with patient at lobby whom showed proof of income for one-time $700 [facility] grant. ° °Patient approved for grant. She has a copy of the approval letter as well as the expense sheet along with the Outpatient pharmacy information. She received a gas card today from her grant. ° °She has my card for any additional financial questions or concerns. °

## 2019-02-08 NOTE — Progress Notes (Signed)
Oncology Nurse Navigator Documentation  Oncology Nurse Navigator Flowsheets 02/08/2019  Diagnosis Status -  Navigator Follow Up Date: -  Navigator Follow Up Reason: -  Navigator Location [facility]  Referral Date to RadOnc/MedOnc -  Navigator Encounter Type Other/patient would like port, I updated Dr. Julien Nordmann. He would like to be be seen with Dr. Roxan Hockey.  Referral completed.   Telephone -  Abnormal Finding Date -  Confirmed Diagnosis Date -  Multidisiplinary Clinic Date -  Treatment Initiated Date 02/12/2019  Patient Visit Type -  Treatment Phase Pre-Tx/Tx Discussion  Barriers/Navigation Needs Coordination of Care  Education -  Interventions Coordination of Care  Coordination of Care Other  Education Method -  Acuity Level 2-Minimal Needs (1-2 Barriers Identified)  Time Spent with Patient 30

## 2019-02-09 ENCOUNTER — Other Ambulatory Visit: Payer: Self-pay | Admitting: *Deleted

## 2019-02-09 DIAGNOSIS — C3490 Malignant neoplasm of unspecified part of unspecified bronchus or lung: Secondary | ICD-10-CM

## 2019-02-09 DIAGNOSIS — Z510 Encounter for antineoplastic radiation therapy: Secondary | ICD-10-CM | POA: Diagnosis not present

## 2019-02-09 DIAGNOSIS — C3411 Malignant neoplasm of upper lobe, right bronchus or lung: Secondary | ICD-10-CM | POA: Diagnosis not present

## 2019-02-12 ENCOUNTER — Inpatient Hospital Stay: Payer: Medicare HMO

## 2019-02-12 ENCOUNTER — Other Ambulatory Visit: Payer: Self-pay

## 2019-02-12 ENCOUNTER — Ambulatory Visit
Admission: RE | Admit: 2019-02-12 | Discharge: 2019-02-12 | Disposition: A | Discharge status: Home / Self Care | Payer: Medicare HMO | Source: Ambulatory Visit | Attending: Radiation Oncology | Admitting: Radiation Oncology

## 2019-02-12 VITALS — BP 110/78 | HR 73 | Temp 98.0°F | Resp 16 | Wt 112.5 lb

## 2019-02-12 DIAGNOSIS — Z510 Encounter for antineoplastic radiation therapy: Secondary | ICD-10-CM | POA: Diagnosis not present

## 2019-02-12 DIAGNOSIS — C3411 Malignant neoplasm of upper lobe, right bronchus or lung: Secondary | ICD-10-CM | POA: Diagnosis not present

## 2019-02-12 DIAGNOSIS — C3481 Malignant neoplasm of overlapping sites of right bronchus and lung: Secondary | ICD-10-CM

## 2019-02-12 DIAGNOSIS — Z791 Long term (current) use of non-steroidal anti-inflammatories (NSAID): Secondary | ICD-10-CM | POA: Diagnosis not present

## 2019-02-12 DIAGNOSIS — Z5111 Encounter for antineoplastic chemotherapy: Secondary | ICD-10-CM | POA: Diagnosis not present

## 2019-02-12 DIAGNOSIS — Z79899 Other long term (current) drug therapy: Secondary | ICD-10-CM | POA: Diagnosis not present

## 2019-02-12 DIAGNOSIS — J449 Chronic obstructive pulmonary disease, unspecified: Secondary | ICD-10-CM | POA: Diagnosis not present

## 2019-02-12 DIAGNOSIS — E785 Hyperlipidemia, unspecified: Secondary | ICD-10-CM | POA: Diagnosis not present

## 2019-02-12 DIAGNOSIS — M8580 Other specified disorders of bone density and structure, unspecified site: Secondary | ICD-10-CM | POA: Diagnosis not present

## 2019-02-12 DIAGNOSIS — Z5189 Encounter for other specified aftercare: Secondary | ICD-10-CM | POA: Diagnosis not present

## 2019-02-12 DIAGNOSIS — I10 Essential (primary) hypertension: Secondary | ICD-10-CM | POA: Diagnosis not present

## 2019-02-12 DIAGNOSIS — Z7982 Long term (current) use of aspirin: Secondary | ICD-10-CM | POA: Diagnosis not present

## 2019-02-12 LAB — CBC WITH DIFFERENTIAL (CANCER CENTER ONLY)
Abs Immature Granulocytes: 0.01 10*3/uL (ref 0.00–0.07)
Basophils Absolute: 0 10*3/uL (ref 0.0–0.1)
Basophils Relative: 1 %
Eosinophils Absolute: 0.2 10*3/uL (ref 0.0–0.5)
Eosinophils Relative: 3 %
HCT: 42 % (ref 36.0–46.0)
Hemoglobin: 14.4 g/dL (ref 12.0–15.0)
Immature Granulocytes: 0 %
Lymphocytes Relative: 35 %
Lymphs Abs: 2.1 10*3/uL (ref 0.7–4.0)
MCH: 32.7 pg (ref 26.0–34.0)
MCHC: 34.3 g/dL (ref 30.0–36.0)
MCV: 95.5 fL (ref 80.0–100.0)
Monocytes Absolute: 0.6 10*3/uL (ref 0.1–1.0)
Monocytes Relative: 9 %
Neutro Abs: 3.1 10*3/uL (ref 1.7–7.7)
Neutrophils Relative %: 52 %
Platelet Count: 267 10*3/uL (ref 150–400)
RBC: 4.4 MIL/uL (ref 3.87–5.11)
RDW: 12.5 % (ref 11.5–15.5)
WBC Count: 6 10*3/uL (ref 4.0–10.5)
nRBC: 0 % (ref 0.0–0.2)

## 2019-02-12 LAB — CMP (CANCER CENTER ONLY)
ALT: 9 U/L (ref 0–44)
AST: 16 U/L (ref 15–41)
Albumin: 3.8 g/dL (ref 3.5–5.0)
Alkaline Phosphatase: 76 U/L (ref 38–126)
Anion gap: 11 (ref 5–15)
BUN: 16 mg/dL (ref 8–23)
CO2: 26 mmol/L (ref 22–32)
Calcium: 10.2 mg/dL (ref 8.9–10.3)
Chloride: 95 mmol/L — ABNORMAL LOW (ref 98–111)
Creatinine: 0.8 mg/dL (ref 0.44–1.00)
GFR, Est AFR Am: >60 mL/min (ref >60)
GFR, Estimated: >60 mL/min (ref >60)
Glucose, Bld: 103 mg/dL — ABNORMAL HIGH (ref 70–99)
Potassium: 4.4 mmol/L (ref 3.5–5.1)
Sodium: 132 mmol/L — ABNORMAL LOW (ref 135–145)
Total Bilirubin: 0.3 mg/dL (ref 0.3–1.2)
Total Protein: 8.1 g/dL (ref 6.5–8.1)

## 2019-02-12 MED ORDER — PALONOSETRON HCL INJECTION 0.25 MG/5ML
0.2500 mg | Freq: Once | INTRAVENOUS | Status: AC
  Administered 2019-02-12: 0.25 mg via INTRAVENOUS

## 2019-02-12 MED ORDER — SODIUM CHLORIDE 0.9 % IV SOLN
Freq: Once | INTRAVENOUS | Status: AC
  Administered 2019-02-12: 13:00:00 via INTRAVENOUS
  Filled 2019-02-12: qty 250

## 2019-02-12 MED ORDER — SODIUM CHLORIDE 0.9 % IV SOLN
120.0000 mg/m2 | Freq: Once | INTRAVENOUS | Status: AC
  Administered 2019-02-12: 180 mg via INTRAVENOUS
  Filled 2019-02-12: qty 9

## 2019-02-12 MED ORDER — SODIUM CHLORIDE 0.9 % IV SOLN
Freq: Once | INTRAVENOUS | Status: AC
  Administered 2019-02-12: 14:00:00 via INTRAVENOUS
  Filled 2019-02-12: qty 5

## 2019-02-12 MED ORDER — SODIUM CHLORIDE 0.9 % IV SOLN
330.0000 mg | Freq: Once | INTRAVENOUS | Status: AC
  Administered 2019-02-12: 330 mg via INTRAVENOUS
  Filled 2019-02-12: qty 33

## 2019-02-12 MED ORDER — PALONOSETRON HCL INJECTION 0.25 MG/5ML
INTRAVENOUS | Status: AC
  Filled 2019-02-12: qty 5

## 2019-02-12 NOTE — Patient Instructions (Signed)
Carboplatin injection What is this medicine? CARBOPLATIN (KAR boe pla tin) is a chemotherapy drug. It targets fast dividing cells, like cancer cells, and causes these cells to die. This medicine is used to treat ovarian cancer and many other cancers. This medicine may be used for other purposes; ask your health care provider or pharmacist if you have questions. COMMON BRAND NAME(S): Paraplatin What should I tell my health care provider before I take this medicine? They need to know if you have any of these conditions:  blood disorders  hearing problems  kidney disease  recent or ongoing radiation therapy  an unusual or allergic reaction to carboplatin, cisplatin, other chemotherapy, other medicines, foods, dyes, or preservatives  pregnant or trying to get pregnant  breast-feeding How should I use this medicine? This drug is usually given as an infusion into a vein. It is administered in a hospital or clinic by a specially trained health care professional. Talk to your pediatrician regarding the use of this medicine in children. Special care may be needed. Overdosage: If you think you have taken too much of this medicine contact a poison control center or emergency room at once. NOTE: This medicine is only for you. Do not share this medicine with others. What if I miss a dose? It is important not to miss a dose. Call your doctor or health care professional if you are unable to keep an appointment. What may interact with this medicine?  medicines for seizures  medicines to increase blood counts like filgrastim, pegfilgrastim, sargramostim  some antibiotics like amikacin, gentamicin, neomycin, streptomycin, tobramycin  vaccines Talk to your doctor or health care professional before taking any of these medicines:  acetaminophen  aspirin  ibuprofen  ketoprofen  naproxen This list may not describe all possible interactions. Give your health care provider a list of all the  medicines, herbs, non-prescription drugs, or dietary supplements you use. Also tell them if you smoke, drink alcohol, or use illegal drugs. Some items may interact with your medicine. What should I watch for while using this medicine? Your condition will be monitored carefully while you are receiving this medicine. You will need important blood work done while you are taking this medicine. This drug may make you feel generally unwell. This is not uncommon, as chemotherapy can affect healthy cells as well as cancer cells. Report any side effects. Continue your course of treatment even though you feel ill unless your doctor tells you to stop. In some cases, you may be given additional medicines to help with side effects. Follow all directions for their use. Call your doctor or health care professional for advice if you get a fever, chills or sore throat, or other symptoms of a cold or flu. Do not treat yourself. This drug decreases your body's ability to fight infections. Try to avoid being around people who are sick. This medicine may increase your risk to bruise or bleed. Call your doctor or health care professional if you notice any unusual bleeding. Be careful brushing and flossing your teeth or using a toothpick because you may get an infection or bleed more easily. If you have any dental work done, tell your dentist you are receiving this medicine. Avoid taking products that contain aspirin, acetaminophen, ibuprofen, naproxen, or ketoprofen unless instructed by your doctor. These medicines may hide a fever. Do not become pregnant while taking this medicine. Women should inform their doctor if they wish to become pregnant or think they might be pregnant. There is a  potential for serious side effects to an unborn child. Talk to your health care professional or pharmacist for more information. Do not breast-feed an infant while taking this medicine. What side effects may I notice from receiving this  medicine? Side effects that you should report to your doctor or health care professional as soon as possible:  allergic reactions like skin rash, itching or hives, swelling of the face, lips, or tongue  signs of infection - fever or chills, cough, sore throat, pain or difficulty passing urine  signs of decreased platelets or bleeding - bruising, pinpoint red spots on the skin, black, tarry stools, nosebleeds  signs of decreased red blood cells - unusually weak or tired, fainting spells, lightheadedness  breathing problems  changes in hearing  changes in vision  chest pain  high blood pressure  low blood counts - This drug may decrease the number of white blood cells, red blood cells and platelets. You may be at increased risk for infections and bleeding.  nausea and vomiting  pain, swelling, redness or irritation at the injection site  pain, tingling, numbness in the hands or feet  problems with balance, talking, walking  trouble passing urine or change in the amount of urine Side effects that usually do not require medical attention (report to your doctor or health care professional if they continue or are bothersome):  hair loss  loss of appetite  metallic taste in the mouth or changes in taste This list may not describe all possible side effects. Call your doctor for medical advice about side effects. You may report side effects to FDA at 1-800-FDA-1088. Where should I keep my medicine? This drug is given in a hospital or clinic and will not be stored at home. NOTE: This sheet is a summary. It may not cover all possible information. If you have questions about this medicine, talk to your doctor, pharmacist, or health care provider.  2020 Elsevier/Gold Standard (2007-07-25 14:38:05)   Etoposide, VP-16 injection What is this medicine? ETOPOSIDE, VP-16 (e toe POE side) is a chemotherapy drug. It is used to treat testicular cancer, lung cancer, and other cancers. This  medicine may be used for other purposes; ask your health care provider or pharmacist if you have questions. COMMON BRAND NAME(S): Etopophos, Toposar, VePesid What should I tell my health care provider before I take this medicine? They need to know if you have any of these conditions:  infection  kidney disease  liver disease  low blood counts, like low white cell, platelet, or red cell counts  an unusual or allergic reaction to etoposide, other medicines, foods, dyes, or preservatives  pregnant or trying to get pregnant  breast-feeding How should I use this medicine? This medicine is for infusion into a vein. It is administered in a hospital or clinic by a specially trained health care professional. Talk to your pediatrician regarding the use of this medicine in children. Special care may be needed. Overdosage: If you think you have taken too much of this medicine contact a poison control center or emergency room at once. NOTE: This medicine is only for you. Do not share this medicine with others. What if I miss a dose? It is important not to miss your dose. Call your doctor or health care professional if you are unable to keep an appointment. What may interact with this medicine?  aspirin  certain medications for seizures like carbamazepine, phenobarbital, phenytoin, valproic acid  cyclosporine  levamisole  warfarin This list may not  describe all possible interactions. Give your health care provider a list of all the medicines, herbs, non-prescription drugs, or dietary supplements you use. Also tell them if you smoke, drink alcohol, or use illegal drugs. Some items may interact with your medicine. What should I watch for while using this medicine? Visit your doctor for checks on your progress. This drug may make you feel generally unwell. This is not uncommon, as chemotherapy can affect healthy cells as well as cancer cells. Report any side effects. Continue your course of  treatment even though you feel ill unless your doctor tells you to stop. In some cases, you may be given additional medicines to help with side effects. Follow all directions for their use. Call your doctor or health care professional for advice if you get a fever, chills or sore throat, or other symptoms of a cold or flu. Do not treat yourself. This drug decreases your body's ability to fight infections. Try to avoid being around people who are sick. This medicine may increase your risk to bruise or bleed. Call your doctor or health care professional if you notice any unusual bleeding. Talk to your doctor about your risk of cancer. You may be more at risk for certain types of cancers if you take this medicine. Do not become pregnant while taking this medicine or for at least 6 months after stopping it. Women should inform their doctor if they wish to become pregnant or think they might be pregnant. Women of child-bearing potential will need to have a negative pregnancy test before starting this medicine. There is a potential for serious side effects to an unborn child. Talk to your health care professional or pharmacist for more information. Do not breast-feed an infant while taking this medicine. Men must use a latex condom during sexual contact with a woman while taking this medicine and for at least 4 months after stopping it. A latex condom is needed even if you have had a vasectomy. Contact your doctor right away if your partner becomes pregnant. Do not donate sperm while taking this medicine and for at least 4 months after you stop taking this medicine. Men should inform their doctors if they wish to father a child. This medicine may lower sperm counts. What side effects may I notice from receiving this medicine? Side effects that you should report to your doctor or health care professional as soon as possible:  allergic reactions like skin rash, itching or hives, swelling of the face, lips, or  tongue  low blood counts - this medicine may decrease the number of white blood cells, red blood cells and platelets. You may be at increased risk for infections and bleeding.  signs of infection - fever or chills, cough, sore throat, pain or difficulty passing urine  signs of decreased platelets or bleeding - bruising, pinpoint red spots on the skin, black, tarry stools, blood in the urine  signs of decreased red blood cells - unusually weak or tired, fainting spells, lightheadedness  breathing problems  changes in vision  mouth or throat sores or ulcers  pain, redness, swelling or irritation at the injection site  pain, tingling, numbness in the hands or feet  redness, blistering, peeling or loosening of the skin, including inside the mouth  seizures  vomiting Side effects that usually do not require medical attention (report to your doctor or health care professional if they continue or are bothersome):  diarrhea  hair loss  loss of appetite  nausea  stomach  pain This list may not describe all possible side effects. Call your doctor for medical advice about side effects. You may report side effects to FDA at 1-800-FDA-1088. Where should I keep my medicine? This drug is given in a hospital or clinic and will not be stored at home. NOTE: This sheet is a summary. It may not cover all possible information. If you have questions about this medicine, talk to your doctor, pharmacist, or health care provider.  2020 Elsevier/Gold Standard (2015-04-11 11:53:23)

## 2019-02-12 NOTE — Progress Notes (Signed)
Clarified Etoposide dose with MD. Increase Etoposide dose to 120mg /m2.  Orders changed as discussed.  Hardie Pulley, PharmD, BCPS, BCOP

## 2019-02-13 ENCOUNTER — Ambulatory Visit (HOSPITAL_BASED_OUTPATIENT_CLINIC_OR_DEPARTMENT_OTHER): Payer: Medicare HMO | Admitting: Medical

## 2019-02-13 ENCOUNTER — Other Ambulatory Visit: Payer: Self-pay

## 2019-02-13 ENCOUNTER — Other Ambulatory Visit: Payer: Self-pay | Admitting: Medical

## 2019-02-13 ENCOUNTER — Ambulatory Visit
Admission: RE | Admit: 2019-02-13 | Discharge: 2019-02-13 | Disposition: A | Discharge status: Home / Self Care | Payer: Medicare HMO | Source: Ambulatory Visit | Attending: Radiation Oncology | Admitting: Radiation Oncology

## 2019-02-13 ENCOUNTER — Inpatient Hospital Stay: Payer: Medicare HMO

## 2019-02-13 VITALS — BP 129/71 | HR 83 | Temp 97.4°F | Resp 18

## 2019-02-13 DIAGNOSIS — C3411 Malignant neoplasm of upper lobe, right bronchus or lung: Secondary | ICD-10-CM

## 2019-02-13 DIAGNOSIS — C3481 Malignant neoplasm of overlapping sites of right bronchus and lung: Secondary | ICD-10-CM | POA: Diagnosis not present

## 2019-02-13 DIAGNOSIS — Z510 Encounter for antineoplastic radiation therapy: Secondary | ICD-10-CM | POA: Diagnosis not present

## 2019-02-13 DIAGNOSIS — Z5111 Encounter for antineoplastic chemotherapy: Secondary | ICD-10-CM | POA: Diagnosis not present

## 2019-02-13 DIAGNOSIS — R079 Chest pain, unspecified: Secondary | ICD-10-CM | POA: Diagnosis not present

## 2019-02-13 MED ORDER — DEXAMETHASONE SODIUM PHOSPHATE 10 MG/ML IJ SOLN
10.0000 mg | Freq: Once | INTRAMUSCULAR | Status: AC
  Administered 2019-02-13: 10 mg via INTRAVENOUS

## 2019-02-13 MED ORDER — SODIUM CHLORIDE 0.9 % IV SOLN
Freq: Once | INTRAVENOUS | Status: AC
  Administered 2019-02-13: 16:00:00 via INTRAVENOUS
  Filled 2019-02-13: qty 250

## 2019-02-13 MED ORDER — SODIUM CHLORIDE 0.9 % IV SOLN
120.0000 mg/m2 | Freq: Once | INTRAVENOUS | Status: AC
  Administered 2019-02-13: 180 mg via INTRAVENOUS
  Filled 2019-02-13: qty 9

## 2019-02-13 MED ORDER — DEXAMETHASONE SODIUM PHOSPHATE 10 MG/ML IJ SOLN
INTRAMUSCULAR | Status: AC
  Filled 2019-02-13: qty 1

## 2019-02-13 NOTE — Patient Instructions (Signed)
[facility] Discharge Instructions for Patients Receiving Chemotherapy  Today you received the following chemotherapy agents Etoposide  To help prevent nausea and vomiting after your treatment, we encourage you to take your nausea medication as directed.   If you develop nausea and vomiting that is not controlled by your nausea medication, call the clinic.   BELOW ARE SYMPTOMS THAT SHOULD BE REPORTED IMMEDIATELY:  *FEVER GREATER THAN 100.5 F  *CHILLS WITH OR WITHOUT FEVER  NAUSEA AND VOMITING THAT IS NOT CONTROLLED WITH YOUR NAUSEA MEDICATION  *UNUSUAL SHORTNESS OF BREATH  *UNUSUAL BRUISING OR BLEEDING  TENDERNESS IN MOUTH AND THROAT WITH OR WITHOUT PRESENCE OF ULCERS  *URINARY PROBLEMS  *BOWEL PROBLEMS  UNUSUAL RASH Items with * indicate a potential emergency and should be followed up as soon as possible.  Feel free to call the clinic should you have any questions or concerns. The clinic phone number is [phone].  Please show the [place] at check-in to the Emergency Department and triage nurse.

## 2019-02-13 NOTE — Progress Notes (Signed)
Pt reports R sided chest pain for 3 hours. Sandi Mealy at bedside to assess. EKG Obtained

## 2019-02-14 ENCOUNTER — Ambulatory Visit
Admission: RE | Admit: 2019-02-14 | Discharge: 2019-02-14 | Disposition: A | Discharge status: Home / Self Care | Payer: Medicare HMO | Source: Ambulatory Visit | Attending: Radiation Oncology | Admitting: Radiation Oncology

## 2019-02-14 ENCOUNTER — Inpatient Hospital Stay: Payer: Medicare HMO

## 2019-02-14 ENCOUNTER — Inpatient Hospital Stay (HOSPITAL_BASED_OUTPATIENT_CLINIC_OR_DEPARTMENT_OTHER): Payer: Medicare HMO | Admitting: Medical

## 2019-02-14 ENCOUNTER — Other Ambulatory Visit: Payer: Self-pay

## 2019-02-14 VITALS — BP 147/75 | HR 90 | Temp 98.9°F | Resp 18

## 2019-02-14 DIAGNOSIS — Z510 Encounter for antineoplastic radiation therapy: Secondary | ICD-10-CM | POA: Diagnosis not present

## 2019-02-14 DIAGNOSIS — C3411 Malignant neoplasm of upper lobe, right bronchus or lung: Secondary | ICD-10-CM

## 2019-02-14 DIAGNOSIS — R5381 Other malaise: Secondary | ICD-10-CM

## 2019-02-14 DIAGNOSIS — Z5111 Encounter for antineoplastic chemotherapy: Secondary | ICD-10-CM | POA: Diagnosis not present

## 2019-02-14 DIAGNOSIS — R2681 Unsteadiness on feet: Secondary | ICD-10-CM

## 2019-02-14 MED ORDER — SONAFINE EX EMUL
1.0000 "application " | Freq: Once | CUTANEOUS | Status: AC
  Administered 2019-02-14: 1 via TOPICAL

## 2019-02-14 MED ORDER — DEXAMETHASONE SODIUM PHOSPHATE 10 MG/ML IJ SOLN
10.0000 mg | Freq: Once | INTRAMUSCULAR | Status: AC
  Administered 2019-02-14: 10 mg via INTRAVENOUS

## 2019-02-14 MED ORDER — DEXAMETHASONE SODIUM PHOSPHATE 10 MG/ML IJ SOLN
INTRAMUSCULAR | Status: AC
  Filled 2019-02-14: qty 1

## 2019-02-14 MED ORDER — SODIUM CHLORIDE 0.9 % IV SOLN
120.0000 mg/m2 | Freq: Once | INTRAVENOUS | Status: AC
  Administered 2019-02-14: 180 mg via INTRAVENOUS
  Filled 2019-02-14: qty 9

## 2019-02-14 MED ORDER — SODIUM CHLORIDE 0.9 % IV SOLN
Freq: Once | INTRAVENOUS | Status: AC
  Administered 2019-02-14: 14:00:00 via INTRAVENOUS
  Filled 2019-02-14: qty 250

## 2019-02-14 NOTE — Progress Notes (Signed)
Pt is reporting that she has increased weakness today, very weak in her legs, almost fell at home & this is a change for her today.  Shelia Media PA informed, is coming to see pt.  Ok to proceed today per V. Tanner PA.

## 2019-02-14 NOTE — Progress Notes (Signed)
Pt here for patient teaching.  Pt given Radiation and You booklet and Sonafine.  Reviewed areas of pertinence such as fatigue, hair loss, skin changes, throat changes, cough and shortness of breath . Pt able to give teach back of to pat skin and use unscented/gentle soap,apply Sonafine bid and avoid applying anything to skin within 4 hours of treatment. Pt verbalizes understanding of information given and will contact nursing with any questions or concerns.    Gloriajean Dell. Leonie Green, BSN

## 2019-02-14 NOTE — Patient Instructions (Signed)
[facility] Discharge Instructions for Patients Receiving Chemotherapy  Today you received the following chemotherapy agents Etoposide  To help prevent nausea and vomiting after your treatment, we encourage you to take your nausea medication as directed.   If you develop nausea and vomiting that is not controlled by your nausea medication, call the clinic.   BELOW ARE SYMPTOMS THAT SHOULD BE REPORTED IMMEDIATELY:  *FEVER GREATER THAN 100.5 F  *CHILLS WITH OR WITHOUT FEVER  NAUSEA AND VOMITING THAT IS NOT CONTROLLED WITH YOUR NAUSEA MEDICATION  *UNUSUAL SHORTNESS OF BREATH  *UNUSUAL BRUISING OR BLEEDING  TENDERNESS IN MOUTH AND THROAT WITH OR WITHOUT PRESENCE OF ULCERS  *URINARY PROBLEMS  *BOWEL PROBLEMS  UNUSUAL RASH Items with * indicate a potential emergency and should be followed up as soon as possible.  Feel free to call the clinic should you have any questions or concerns. The clinic phone number is [phone].  Please show the [name] at check-in to the Emergency Department and triage nurse.

## 2019-02-15 ENCOUNTER — Ambulatory Visit
Admission: RE | Admit: 2019-02-15 | Discharge: 2019-02-15 | Disposition: A | Discharge status: Home / Self Care | Payer: Medicare HMO | Source: Ambulatory Visit | Attending: Radiation Oncology | Admitting: Radiation Oncology

## 2019-02-15 ENCOUNTER — Other Ambulatory Visit: Payer: Self-pay

## 2019-02-15 DIAGNOSIS — Z510 Encounter for antineoplastic radiation therapy: Secondary | ICD-10-CM | POA: Diagnosis not present

## 2019-02-15 DIAGNOSIS — C3411 Malignant neoplasm of upper lobe, right bronchus or lung: Secondary | ICD-10-CM | POA: Diagnosis not present

## 2019-02-16 ENCOUNTER — Telehealth: Payer: Self-pay | Admitting: Internal Medicine

## 2019-02-16 ENCOUNTER — Telehealth: Payer: Self-pay | Admitting: Medical

## 2019-02-16 ENCOUNTER — Encounter (HOSPITAL_COMMUNITY): Payer: Self-pay | Admitting: Vascular Surgery

## 2019-02-16 ENCOUNTER — Other Ambulatory Visit: Payer: Self-pay

## 2019-02-16 ENCOUNTER — Ambulatory Visit
Admission: RE | Admit: 2019-02-16 | Discharge: 2019-02-16 | Disposition: A | Discharge status: Home / Self Care | Payer: Medicare HMO | Source: Ambulatory Visit | Attending: Radiation Oncology | Admitting: Radiation Oncology

## 2019-02-16 ENCOUNTER — Other Ambulatory Visit (HOSPITAL_COMMUNITY)
Admission: RE | Admit: 2019-02-16 | Discharge: 2019-02-16 | Disposition: A | Discharge status: Home / Self Care | Payer: Medicare HMO | Source: Ambulatory Visit | Attending: Thoracic Surgery (Cardiothoracic Vascular Surgery) | Admitting: Thoracic Surgery (Cardiothoracic Vascular Surgery)

## 2019-02-16 ENCOUNTER — Inpatient Hospital Stay: Payer: Medicare HMO

## 2019-02-16 VITALS — BP 140/62 | HR 84 | Temp 97.8°F | Resp 18

## 2019-02-16 DIAGNOSIS — Z01812 Encounter for preprocedural laboratory examination: Secondary | ICD-10-CM | POA: Diagnosis not present

## 2019-02-16 DIAGNOSIS — Z20828 Contact with and (suspected) exposure to other viral communicable diseases: Secondary | ICD-10-CM | POA: Diagnosis not present

## 2019-02-16 DIAGNOSIS — C3411 Malignant neoplasm of upper lobe, right bronchus or lung: Secondary | ICD-10-CM

## 2019-02-16 DIAGNOSIS — Z510 Encounter for antineoplastic radiation therapy: Secondary | ICD-10-CM | POA: Diagnosis not present

## 2019-02-16 MED ORDER — PEGFILGRASTIM-CBQV 6 MG/0.6ML ~~LOC~~ SOSY
PREFILLED_SYRINGE | SUBCUTANEOUS | Status: AC
  Filled 2019-02-16: qty 0.6

## 2019-02-16 MED ORDER — PEGFILGRASTIM-CBQV 6 MG/0.6ML ~~LOC~~ SOSY: 6.0000 mg | PREFILLED_SYRINGE | Freq: Once | SUBCUTANEOUS | Status: DC

## 2019-02-16 NOTE — Progress Notes (Signed)
[facility] Progress Note   Stacy [name] [id number] 01-23-1946 73 y.o.  Stacy [name] is managed by Dr. Fanny Bien. Mohamed  Actively treated with chemotherapy/immunotherapy/hormonal therapy: yes  Current therapy: Carboplatin and etoposide  Last treated: 02/12/2019 (cycle 1, day 1)  Next scheduled appointment with provider: 02/19/2019  Assessment: Plan:    Chest pain, unspecified type  Small cell lung cancer, overlapping sites of right lung ([place])   Chest pain: An EKG returned showing a sinus rhythm with first-degree AV block with consideration given for a septal infarct of undetermined age.  The ventricular rate returned at 77 bpm.  This was compared to the patient's multiple prior EKGs with no acute changes noted.  Small cell lung cancer: The patient is managed by Dr. Julien Nordmann and is receiving cycle 1, day 2 of carboplatin and etoposide today.  Her treatment will proceed as scheduled.  Please see After Visit Summary for patient specific instructions.  Future Appointments  Date Time Provider [place]  02/16/2019  1:45 PM [facility]ENING [facility] None  02/16/2019  2:30 PM [facility] LINAC 4 [facility] None  02/16/2019  3:15 PM [facility] [place] FLUSH [facility] None  02/19/2019  2:00 PM [facility] LAB 1 [facility] None  02/19/2019  2:30 PM [facility] LINAC 4 [facility] None  02/19/2019  3:00 PM Heilingoetter, Cassandra L, PA-C [facility] None  02/20/2019  2:30 PM [facility] LINAC 4 [facility] None  02/21/2019  9:40 AM Hilts, Michael, MD [facility] None  02/21/2019  2:30 PM [facility] LINAC 4 [facility] None  02/22/2019  2:30 PM [facility] LINAC 4 [facility] None  02/23/2019  2:30 PM [facility] LINAC 4 [facility] None  02/26/2019  2:20 PM [facility] LINAC 4 [facility] None  02/26/2019  3:00 PM [facility] LAB 6 [facility] None  02/27/2019  2:20 PM [facility] LINAC 4 [facility] None  02/28/2019  2:20 PM [facility] LINAC 4 [facility]  None  03/01/2019  2:20 PM [facility] LINAC 4 [facility] None  03/02/2019  2:20 PM [facility] LINAC 4 [facility] None  03/05/2019  9:15 AM [facility] LAB 3 [facility] None  03/05/2019  9:45 AM Mohamed, Julien Nordmann, MD [facility] None  03/05/2019 10:30 AM [facility] INFUSION [facility] None  03/05/2019  2:20 PM [facility] LINAC 4 [facility] None  03/06/2019 11:30 AM [facility] INFUSION [facility] None  03/06/2019  2:20 PM [facility] LINAC 4 [facility] None  03/07/2019 11:30 AM [facility] INFUSION [facility] None  03/07/2019  2:20 PM [facility] LINAC 4 [facility] None  03/08/2019 10:45 AM Patwardhan, Reynold Bowen, MD PCV-PCV None  03/08/2019  2:00 PM [name], MD GAAM[facility]GAAIM None  03/08/2019  2:20 PM [facility] LINAC 4 [facility] None  03/09/2019  2:20 PM [facility] LINAC 4 [facility] None  03/09/2019  3:15 PM [facility] [place] FLUSH [facility] None  03/12/2019  2:20 PM [facility] LINAC 4 [facility] None  03/13/2019  2:20 PM [facility] LINAC 4 [facility] None  03/14/2019  2:20 PM [facility] LINAC 4 [facility] None  03/15/2019  2:20 PM [facility] LINAC 4 [facility] None  03/16/2019  2:20 PM [facility] LINAC 4 [facility] None  03/19/2019  2:20 PM [facility] LINAC 4 [facility] None  03/20/2019  2:20 PM [facility] LINAC 4 [facility] None  03/21/2019  2:20 PM [facility] LINAC 4 [facility] None  03/22/2019  2:20 PM [facility] LINAC 4 [facility] None  03/23/2019  2:20 PM [facility] LINAC 4 [facility] None  03/26/2019  2:05 PM [facility] LINAC 4 [facility] None  03/27/2019  2:05 PM [facility] LINAC 4 [facility] None  03/28/2019  2:05 PM [facility] LINAC 4 [facility] None  No orders of the defined types were placed in this encounter.      Subjective:   Patient ID:  Stacy [name] is a 73 y.o. (DOB [birth year]) female.  Chief Complaint: No chief complaint on file.   HPI Stacy [name] Is a 73 y.o. female with a diagnosis of a small cell lung cancer.  She  is managed by Dr. Julien Nordmann and is receiving cycle 1, day 2 of carboplatin and etoposide today.  She reported right chest pain without radiation, diaphoresis or increased shortness of breath over her baseline.  An EKG was completed with no acute findings.  Medications: I have reviewed the patient's current medications.  Allergies:  Allergies  Allergen Reactions  . Dilaudid [Hydromorphone Hcl] Shortness Of Breath and Swelling  . Fentanyl Shortness Of Breath and Swelling  . Penicillins Anaphylaxis    (Had skin test at [facility]) Did it involve swelling of the face/tongue/throat, SOB, or low BP? Yes  +++CARDIAC ARREST+++ Did it involve sudden or severe rash/hives, skin peeling, or any reaction on the inside of your mouth or nose? No Did you need to seek medical attention at a hospital or doctor's office? Yes When did it last happen? Many years ago If all above answers are "NO", may proceed with cephalosporin use.   . Crestor [Rosuvastatin] Other (See Comments)    Joint pain, cramping     Past Medical History:  Diagnosis Date  . Carotid bruit    Bilateral  . Chest pain    occasionally, takes nitro if needed, at rest (1-2 times a month)  . Colon polyps    with divertirticulitis  . COPD (chronic obstructive pulmonary disease) ([place])   . DJD (degenerative joint disease)   . Emphysema lung ([place])   . Family history of adverse reaction to anesthesia    mom PONV  . Hyperlipidemia   . Hypertension   . Lymph node cancer ([place])   . Osteopenia   . Prediabetes   . Vitamin D deficiency     Past Surgical History:  Procedure Laterality Date  . CATARACT EXTRACTION Bilateral 03/17/2017  . COLONOSCOPY    . ESOPHAGOGASTRODUODENOSCOPY    . HAND SURGERY    . head injury  1995  . LAMINECTOMY  2002   L 5   . NECK SURGERY  2012    c4 -5/6  fushion  . TONSILLECTOMY    . TRIGGER FINGER RELEASE    . VIDEO BRONCHOSCOPY WITH ENDOBRONCHIAL ULTRASOUND N/A 01/25/2019   Procedure: VIDEO BRONCHOSCOPY  WITH ENDOBRONCHIAL ULTRASOUND;  Surgeon: Melrose Nakayama, MD;  Location: [facility] OR;  Service: Thoracic;  Laterality: N/A;    Family History  Problem Relation Age of Onset  . Hyperlipidemia Mother   . Heart disease Mother        7 way bypass/ value replacement/pacemaker  . Heart attack Mother 63  . Diabetes Mother   . Cancer - Other Mother        lung  . Hypertension Father   . Heart attack Father 63  . Cancer - Other Father        throat    Social History   Socioeconomic History  . Marital status: Divorced    Spouse name: Not on file  . Number of children: 3  . Years of education: Not on file  . Highest education level: Not on file  Occupational History  . Not on file  Social Needs  . Financial resource strain: Not on file  .  Food insecurity    Worry: Not on file    Inability: Not on file  . Transportation needs    Medical: Not on file    Non-medical: Not on file  Tobacco Use  . Smoking status: Current Every Day Smoker    Packs/day: 1.00  . Smokeless tobacco: Never Used  . Tobacco comment: smokes 2 cigarettes per day  Substance and Sexual Activity  . Alcohol use: No  . Drug use: Never  . Sexual activity: Not on file  Lifestyle  . Physical activity    Days per week: Not on file    Minutes per session: Not on file  . Stress: Not on file  Relationships  . Social Herbalist on phone: Not on file    Gets together: Not on file    Attends religious service: Not on file    Active member of club or organization: Not on file    Attends meetings of clubs or organizations: Not on file    Relationship status: Not on file  . Intimate partner violence    Fear of current or ex partner: Not on file    Emotionally abused: Not on file    Physically abused: Not on file    Forced sexual activity: Not on file  Other Topics Concern  . 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   Please see review of systems for further details on the patient's review from today.   Review of Systems:  Review of Systems  Constitutional: Negative for chills, diaphoresis and fever.  HENT: Negative for trouble swallowing.   Respiratory: Negative for cough, choking, chest tightness, shortness of breath, wheezing and stridor.   Cardiovascular: Positive for chest pain. Negative for palpitations.    Objective:   Physical Exam:  There were no vitals taken for this visit. ECOG: 0  Physical Exam Constitutional:      General: She is not in acute distress.    Appearance: She is not diaphoretic.  HENT:     Head: Normocephalic and atraumatic.  Cardiovascular:     Rate and Rhythm: Normal rate and regular rhythm.     Heart sounds: Normal heart sounds. No murmur. No friction rub. No gallop.   Pulmonary:     Effort: Pulmonary effort is normal. No respiratory distress.     Breath sounds: Normal breath sounds. No wheezing or rales.  Skin:    General: Skin is warm and dry.     Findings: No erythema or rash.  Neurological:     Mental Status: She is alert.     Lab Review:     Component Value Date/Time   NA 132 (L) 02/12/2019 1133   K 4.4 02/12/2019 1133   CL 95 (L) 02/12/2019 1133   CO2 26 02/12/2019 1133   GLUCOSE 103 (H) 02/12/2019 1133   BUN 16 02/12/2019 1133   CREATININE 0.80 02/12/2019 1133   CREATININE 0.80 11/27/2018 1522   CALCIUM 10.2 02/12/2019 1133   PROT 8.1 02/12/2019 1133   ALBUMIN 3.8 02/12/2019 1133   AST 16 02/12/2019 1133   ALT 9 02/12/2019 1133   ALKPHOS 76 02/12/2019 1133   BILITOT 0.3 02/12/2019 1133   GFRNONAA >60 02/12/2019 1133   GFRNONAA 73 11/27/2018 1522   GFRAA >  60 02/12/2019 1133   GFRAA 84 11/27/2018 1522       Component Value Date/Time   WBC 6.0 02/12/2019 1133   WBC 7.9 01/22/2019 1420   RBC 4.40 02/12/2019 1133    HGB 14.4 02/12/2019 1133   HCT 42.0 02/12/2019 1133   PLT 267 02/12/2019 1133   MCV 95.5 02/12/2019 1133   MCH 32.7 02/12/2019 1133   MCHC 34.3 02/12/2019 1133   RDW 12.5 02/12/2019 1133   LYMPHSABS 2.1 02/12/2019 1133   MONOABS 0.6 02/12/2019 1133   EOSABS 0.2 02/12/2019 1133   BASOSABS 0.0 02/12/2019 1133   -------------------------------  Imaging from last 24 hours (if applicable):  Radiology interpretation: Dg Chest 2 View  Result Date: 01/23/2019 CLINICAL DATA:  Preoperative exam for bronchoscopy. EXAM: CHEST - 2 VIEW COMPARISON:  CT 12/11/2018.  Chest x-ray 04/01/2017. FINDINGS: Mediastinal and right hilar mass. Right upper lobe mass. Similar findings noted on recent CT. Chronic interstitial prominence again noted. No focal alveolar infiltrate. No pleural effusion or pneumothorax. Heart size normal. Degenerative change scoliosis thoracic spine. IMPRESSION: 1. Mediastinum and right hilar mass. Right upper lobe mass. Similar findings noted on recent CT and again are again consistent with malignancy. 2.  Chronic interstitial lung disease. Electronically Signed   By: Marcello Moores  Register   On: 01/23/2019 07:00        An EKG was reviewed with Dr. Julien Nordmann. This case was discussed with Dr. Julien Nordmann. He expressed agreement with my management of this patient.

## 2019-02-16 NOTE — Telephone Encounter (Signed)
Added injection after 10/19 f/u per 10/16 schedule message. Left message for patient. Start time remains the same.

## 2019-02-16 NOTE — Progress Notes (Signed)
Sent Dr Maylon Peppers message to let her know patient request

## 2019-02-16 NOTE — Anesthesia Preprocedure Evaluation (Addendum)
Anesthesia Evaluation  Patient identified by MRN, date of birth, ID band Patient awake    Reviewed: Allergy & Precautions, H&P , Patient's Chart, lab work & pertinent test results, reviewed documented beta blocker date and time   Airway Mallampati: II  TM Distance: >3 FB Neck ROM: Full    Dental  (+) Missing, Partial Upper   Pulmonary COPD,  COPD inhaler, Current Smoker and Patient abstained from smoking.,  Lung CA   breath sounds clear to auscultation       Cardiovascular Exercise Tolerance: Good hypertension, Pt. on medications and Pt. on home beta blockers + CAD and + Peripheral Vascular Disease   Rhythm:regular Rate:Normal     Neuro/Psych negative neurological ROS  negative psych ROS   GI/Hepatic negative GI ROS, Neg liver ROS,   Endo/Other  negative endocrine ROS  Renal/GU negative Renal ROS  negative genitourinary   Musculoskeletal  (+) Arthritis , Osteoarthritis,    Abdominal   Peds  Hematology negative hematology ROS (+)   Anesthesia Other Findings   Reproductive/Obstetrics negative OB ROS                          Anesthesia Physical Anesthesia Plan  ASA: III  Anesthesia Plan: MAC   Post-op Pain Management:    Induction: Intravenous  PONV Risk Score and Plan: 2 and Propofol infusion and Ondansetron  Airway Management Planned: Simple Face Mask  Additional Equipment:   Intra-op Plan:   Post-operative Plan:   Informed Consent: I have reviewed the patients History and Physical, chart, labs and discussed the procedure including the risks, benefits and alternatives for the proposed anesthesia with the patient or authorized representative who has indicated his/her understanding and acceptance.     Dental advisory given  Plan Discussed with: CRNA, Anesthesiologist and Surgeon  Anesthesia Plan Comments: (PAT note written 02/16/2019 by Myra Gianotti, PA-C. )       Anesthesia Quick Evaluation

## 2019-02-16 NOTE — Progress Notes (Signed)
No injection given today. Patient wants to reschedule injection for Monday due to having to wait a long time to get released from pharmacy. I explained to patient that Pharmacy needed to contact Dr Maylon Peppers to see if he still wanted her to get injection due to her being on radiation. Patient still insisted on leaving due to waiting too long and having somewhere to be. Sent schedule message to get injection appointment rescheduled.

## 2019-02-16 NOTE — Progress Notes (Signed)
After several attempts, unable to reach patient.  LMOM with Instructions for DOS Patient denies shortness of breath, fever, cough and chest pain.  PCP - Dr Jacquiline Doe, PA-C Cardiologist - Dr Jackquline Berlin Oncology - Dr Su Hilt  Chest x-ray - DOS 02/19/19 EKG - 02/13/19 Stress Test - 01/24/19 Cardiac Cath - denies  Anesthesia review: Yes, Ebony Hail, [place]  STOP now taking any Aspirin (unless otherwise instructed by your surgeon), Aleve, Naproxen, Ibuprofen, Motrin, Advil, Goody's, BC's, all herbal medications, fish oil, and all vitamins.   Aspirin Instructions: Follow your surgeon's instructions on when to stop aspirin prior to surgery.  If no instructions were given by your surgeon then you will need to call the office for those instructions.  Coronavirus Screening Have you experienced the following symptoms:  Cough yes/no: No Fever (>100.17F)  yes/no: No Runny nose yes/no: No Sore throat yes/no: No Difficulty breathing/shortness of breath  yes/no: No  Have you traveled in the last 14 days and where? yes/no: No  Patient verbalized understanding of instructions that were given via phone.

## 2019-02-16 NOTE — Progress Notes (Addendum)
Anesthesia Chart Review: Stacy [name]   Case: [id number] Date/Time: 02/19/19 0715   Procedure: INSERTION PORT-A-CATH (Bilateral )   Anesthesia type: Monitor Anesthesia Care   Pre-op diagnosis: LUNG CANCER   [facility] RO[facility] / [place] [facility]   Surgeon: Melrose Nakayama, MD      DISCUSSION: Patient is a 73 year old female scheduled for the above procedure. She is s/p bronchoscopy 01/25/19 and was diagnosed with small cell lung cancer.  Current treatment with carboplatin and etoposide (last 02/13/19).  History includes smoking, small cell lung cancer, HTN, HLD, prediabetes, bilateral carotid disease, COPD/emphysema. Right L5-S1 microdiscectomy 12/15/00. C5-6 ACDF 10/07/10. Suspected CAD following 01/2019 stress test.    Patient seen by cardiologist Vernell Leep, MD on 01/23/19 prior to bronchoscopy due to chest pain with intermittent Nitro use. A stress test was ordered which was intermediate risk (medium reversible perfusion defect in basal to apical inferior/inferolateral myocardium, EF 69%). He felt patient likely has 1-2 vessel CAD, but timing of further work-up complicated by "suspected stage IIIa/IV bronchogenic carcinoma which requires biopsy for further staging and treatment plan" (Limited stage, T3, N3, M0, small cell lung cancer now confirmed). He discussed with Dr. Roxan Hockey at that time with decision for aggressive medical management, and after diagnosis and prognosis of cancer more clear would determine further course of management of suspected CAD.  She needs PAC for chemotherapy. She is a same day work-up. Anesthesia team to re-evaluate on the day of surgery.  COVID-19 test scheduled for 02/16/2019.   PROVIDERS: [name], MD is PCP  Curt Bears, MD is Baker Pierini, MD is RAD-[facility] Jackquline Berlin, MD is cardiologist. Also saw Dorris Carnes, MD is 2014.   LABS: Labs at the [facility] as of 02/12/19 included: Lab Results  Component Value Date   WBC 6.0  02/12/2019   HGB 14.4 02/12/2019   HCT 42.0 02/12/2019   PLT 267 02/12/2019   GLUCOSE 103 (H) 02/12/2019   ALT 9 02/12/2019   AST 16 02/12/2019   NA 132 (L) 02/12/2019   K 4.4 02/12/2019   CL 95 (L) 02/12/2019   CREATININE 0.80 02/12/2019   BUN 16 02/12/2019   CO2 26 02/12/2019   TSH 1.57 11/27/2018   INR 1.0 01/22/2019   HGBA1C 5.7 (H) 11/27/2018     EKG: 02/13/19: Sinus rhythm with 1st degree A-V block Septal infarct , age undetermined Abnormal ECG since last tracing no significant change Confirmed by Larae Grooms [phone]) on 02/13/2019 6:29:48 PM   CV: Lexiscan Myoview stress test 01/24/19: Carlton Adam stress test was performed. Stress EKG is non-diagnostic, as this is pharmacological stress test. SPECT images demonstrate medium sized, medium intensity, reversible perfusion defect in basal to apical inferior/ inferolateral myocardium. Normal stress LVEF 69%. Intermediate risk study.    Carotid US 10/10/12: IMPRESSIONS:  Moderate heterogeneous plaque on the right. Mild heterogeneous plaque on the left. 60 to 79% right ICA stenosis. 40 to 59% left ICA stenosis. Patent vertebral arteries with antegrade flow. Follow-up in 6 months recommended. - Dr. Virgina Jock aware, plan for future non-urgent carotid US at 01/24/19 visit  Denied prior echo or cath.   Past Medical History:  Diagnosis Date  . Carotid bruit    Bilateral  . Chest pain    occasionally, takes nitro if needed, at rest (1-2 times a month)  . Colon polyps    with divertirticulitis  . COPD (chronic obstructive pulmonary disease) ([place])   . DJD (degenerative joint disease)   . Emphysema lung ([place])   .  Family history of adverse reaction to anesthesia    mom PONV  . Hyperlipidemia   . Hypertension   . Lymph node cancer ([place])   . Osteopenia   . Prediabetes   . Vitamin D deficiency     Past Surgical History:  Procedure Laterality Date  . CATARACT EXTRACTION Bilateral 03/17/2017  . COLONOSCOPY     . ESOPHAGOGASTRODUODENOSCOPY    . HAND SURGERY    . head injury  1995  . LAMINECTOMY  2002   L 5   . NECK SURGERY  2012    c4 -5/6  fushion  . TONSILLECTOMY    . TRIGGER FINGER RELEASE    . VIDEO BRONCHOSCOPY WITH ENDOBRONCHIAL ULTRASOUND N/A 01/25/2019   Procedure: VIDEO BRONCHOSCOPY WITH ENDOBRONCHIAL ULTRASOUND;  Surgeon: Melrose Nakayama, MD;  Location: [facility] OR;  Service: Thoracic;  Laterality: N/A;    MEDICATIONS: No current facility-administered medications for this encounter.    Marland Kitchen albuterol (PROAIR HFA) 108 (90 Base) MCG/ACT inhaler  . ALPRAZolam (XANAX) 0.5 MG tablet  . aspirin 81 MG tablet  . atenolol (TENORMIN) 25 MG tablet  . cholecalciferol (VITAMIN D) 1000 units tablet  . diphenhydramine-acetaminophen (TYLENOL PM) 25-500 MG TABS tablet  . ezetimibe (ZETIA) 10 MG tablet  . ibuprofen (ADVIL) 200 MG tablet  . isosorbide mononitrate (IMDUR) 30 MG 24 hr tablet  . nitroGLYCERIN (NITROSTAT) 0.4 MG SL tablet  . prochlorperazine (COMPAZINE) 10 MG tablet    Myra Gianotti, PA-C Surgical Short Stay/Anesthesiology [facility] Phone [phone] [facility] Phone [phone] 02/16/2019 11:15 AM

## 2019-02-16 NOTE — Telephone Encounter (Signed)
No los per 10/14.

## 2019-02-17 LAB — NOVEL CORONAVIRUS, NAA (HOSP ORDER, SEND-OUT TO REF LAB; TAT 18-24 HRS): SARS-CoV-2, NAA: NOT DETECTED

## 2019-02-18 NOTE — Progress Notes (Signed)
The patient was seen in the infusion room today.  She reports that she has been using a cane for 2 weeks.  She is having increasing weakness.  She has nearly fallen.  She reports that her gait is unsteady.  She is agreeable to consider home physical therapy with gait training.  She denies any numbness or tingling in her legs and has had no urinary or bowel incontinence.  Sandi Mealy, MHS, PA-C Physician Assistant

## 2019-02-19 ENCOUNTER — Ambulatory Visit
Admission: RE | Admit: 2019-02-19 | Discharge: 2019-02-19 | Disposition: A | Discharge status: Home / Self Care | Payer: Medicare HMO | Source: Ambulatory Visit | Attending: Radiation Oncology | Admitting: Radiation Oncology

## 2019-02-19 ENCOUNTER — Ambulatory Visit (HOSPITAL_COMMUNITY): Payer: Medicare HMO

## 2019-02-19 ENCOUNTER — Ambulatory Visit (HOSPITAL_COMMUNITY)
Admission: RE | Admit: 2019-02-19 | Discharge: 2019-02-19 | Disposition: A | Discharge status: Home / Self Care | Payer: Medicare HMO | Attending: Thoracic Surgery (Cardiothoracic Vascular Surgery) | Admitting: Thoracic Surgery (Cardiothoracic Vascular Surgery)

## 2019-02-19 ENCOUNTER — Encounter (HOSPITAL_COMMUNITY): Payer: Self-pay | Admitting: Anesthesiology

## 2019-02-19 ENCOUNTER — Inpatient Hospital Stay: Payer: Medicare HMO

## 2019-02-19 ENCOUNTER — Ambulatory Visit (HOSPITAL_COMMUNITY): Payer: Medicare HMO | Admitting: Physician Assistant

## 2019-02-19 ENCOUNTER — Inpatient Hospital Stay (HOSPITAL_BASED_OUTPATIENT_CLINIC_OR_DEPARTMENT_OTHER): Payer: Medicare HMO | Admitting: Physician Assistant

## 2019-02-19 ENCOUNTER — Encounter (HOSPITAL_COMMUNITY)
Admission: RE | Disposition: A | Discharge status: Home / Self Care | Payer: Self-pay | Source: Home / Self Care | Attending: Thoracic Surgery (Cardiothoracic Vascular Surgery)

## 2019-02-19 ENCOUNTER — Other Ambulatory Visit: Payer: Self-pay

## 2019-02-19 VITALS — BP 130/88 | HR 78 | Temp 98.7°F | Resp 17 | Ht 62.0 in | Wt 110.5 lb

## 2019-02-19 DIAGNOSIS — I700 Atherosclerosis of aorta: Secondary | ICD-10-CM | POA: Insufficient documentation

## 2019-02-19 DIAGNOSIS — C3491 Malignant neoplasm of unspecified part of right bronchus or lung: Secondary | ICD-10-CM | POA: Diagnosis not present

## 2019-02-19 DIAGNOSIS — C3481 Malignant neoplasm of overlapping sites of right bronchus and lung: Secondary | ICD-10-CM

## 2019-02-19 DIAGNOSIS — M1990 Unspecified osteoarthritis, unspecified site: Secondary | ICD-10-CM | POA: Diagnosis not present

## 2019-02-19 DIAGNOSIS — Z7982 Long term (current) use of aspirin: Secondary | ICD-10-CM | POA: Diagnosis not present

## 2019-02-19 DIAGNOSIS — Z79899 Other long term (current) drug therapy: Secondary | ICD-10-CM | POA: Insufficient documentation

## 2019-02-19 DIAGNOSIS — R69 Illness, unspecified: Secondary | ICD-10-CM | POA: Diagnosis not present

## 2019-02-19 DIAGNOSIS — E782 Mixed hyperlipidemia: Secondary | ICD-10-CM | POA: Diagnosis not present

## 2019-02-19 DIAGNOSIS — I10 Essential (primary) hypertension: Secondary | ICD-10-CM | POA: Diagnosis not present

## 2019-02-19 DIAGNOSIS — C3411 Malignant neoplasm of upper lobe, right bronchus or lung: Secondary | ICD-10-CM | POA: Diagnosis not present

## 2019-02-19 DIAGNOSIS — C3490 Malignant neoplasm of unspecified part of unspecified bronchus or lung: Secondary | ICD-10-CM | POA: Diagnosis not present

## 2019-02-19 DIAGNOSIS — Z510 Encounter for antineoplastic radiation therapy: Secondary | ICD-10-CM | POA: Diagnosis not present

## 2019-02-19 DIAGNOSIS — F17210 Nicotine dependence, cigarettes, uncomplicated: Secondary | ICD-10-CM | POA: Diagnosis not present

## 2019-02-19 DIAGNOSIS — J439 Emphysema, unspecified: Secondary | ICD-10-CM | POA: Diagnosis not present

## 2019-02-19 DIAGNOSIS — Z5111 Encounter for antineoplastic chemotherapy: Secondary | ICD-10-CM | POA: Diagnosis not present

## 2019-02-19 DIAGNOSIS — J449 Chronic obstructive pulmonary disease, unspecified: Secondary | ICD-10-CM | POA: Diagnosis not present

## 2019-02-19 DIAGNOSIS — Z419 Encounter for procedure for purposes other than remedying health state, unspecified: Secondary | ICD-10-CM

## 2019-02-19 DIAGNOSIS — E785 Hyperlipidemia, unspecified: Secondary | ICD-10-CM | POA: Insufficient documentation

## 2019-02-19 DIAGNOSIS — M8580 Other specified disorders of bone density and structure, unspecified site: Secondary | ICD-10-CM | POA: Diagnosis not present

## 2019-02-19 HISTORY — PX: PORTACATH PLACEMENT: SHX2246

## 2019-02-19 HISTORY — DX: Malignant neoplasm of unspecified part of unspecified bronchus or lung: C34.90

## 2019-02-19 LAB — CBC WITH DIFFERENTIAL (CANCER CENTER ONLY)
Abs Immature Granulocytes: 0.1 10*3/uL — ABNORMAL HIGH (ref 0.00–0.07)
Basophils Absolute: 0 10*3/uL (ref 0.0–0.1)
Basophils Relative: 1 %
Eosinophils Absolute: 0.1 10*3/uL (ref 0.0–0.5)
Eosinophils Relative: 3 %
HCT: 33.5 % — ABNORMAL LOW (ref 36.0–46.0)
Hemoglobin: 11.5 g/dL — ABNORMAL LOW (ref 12.0–15.0)
Immature Granulocytes: 3 %
Lymphocytes Relative: 20 %
Lymphs Abs: 0.6 10*3/uL — ABNORMAL LOW (ref 0.7–4.0)
MCH: 33.3 pg (ref 26.0–34.0)
MCHC: 34.3 g/dL (ref 30.0–36.0)
MCV: 97.1 fL (ref 80.0–100.0)
Monocytes Absolute: 0 10*3/uL — ABNORMAL LOW (ref 0.1–1.0)
Monocytes Relative: 1 %
Neutro Abs: 2.3 10*3/uL (ref 1.7–7.7)
Neutrophils Relative %: 72 %
Platelet Count: 130 10*3/uL — ABNORMAL LOW (ref 150–400)
RBC: 3.45 MIL/uL — ABNORMAL LOW (ref 3.87–5.11)
RDW: 12.1 % (ref 11.5–15.5)
WBC Count: 3.1 10*3/uL — ABNORMAL LOW (ref 4.0–10.5)
nRBC: 0 % (ref 0.0–0.2)

## 2019-02-19 LAB — COMPREHENSIVE METABOLIC PANEL
ALT: 12 U/L (ref 0–44)
AST: 14 U/L — ABNORMAL LOW (ref 15–41)
Albumin: 3.3 g/dL — ABNORMAL LOW (ref 3.5–5.0)
Alkaline Phosphatase: 63 U/L (ref 38–126)
Anion gap: 13 (ref 5–15)
BUN: 18 mg/dL (ref 8–23)
CO2: 23 mmol/L (ref 22–32)
Calcium: 9.4 mg/dL (ref 8.9–10.3)
Chloride: 94 mmol/L — ABNORMAL LOW (ref 98–111)
Creatinine, Ser: 0.73 mg/dL (ref 0.44–1.00)
GFR calc Af Amer: >60 mL/min (ref >60)
GFR calc non Af Amer: >60 mL/min (ref >60)
Glucose, Bld: 116 mg/dL — ABNORMAL HIGH (ref 70–99)
Potassium: 4.2 mmol/L (ref 3.5–5.1)
Sodium: 130 mmol/L — ABNORMAL LOW (ref 135–145)
Total Bilirubin: 1.3 mg/dL — ABNORMAL HIGH (ref 0.3–1.2)
Total Protein: 7.1 g/dL (ref 6.5–8.1)

## 2019-02-19 LAB — CMP (CANCER CENTER ONLY)
ALT: 9 U/L (ref 0–44)
AST: 14 U/L — ABNORMAL LOW (ref 15–41)
Albumin: 3.2 g/dL — ABNORMAL LOW (ref 3.5–5.0)
Alkaline Phosphatase: 70 U/L (ref 38–126)
Anion gap: 14 (ref 5–15)
BUN: 19 mg/dL (ref 8–23)
CO2: 20 mmol/L — ABNORMAL LOW (ref 22–32)
Calcium: 9.3 mg/dL (ref 8.9–10.3)
Chloride: 96 mmol/L — ABNORMAL LOW (ref 98–111)
Creatinine: 0.76 mg/dL (ref 0.44–1.00)
GFR, Est AFR Am: >60 mL/min (ref >60)
GFR, Estimated: >60 mL/min (ref >60)
Glucose, Bld: 104 mg/dL — ABNORMAL HIGH (ref 70–99)
Potassium: 4.1 mmol/L (ref 3.5–5.1)
Sodium: 130 mmol/L — ABNORMAL LOW (ref 135–145)
Total Bilirubin: 0.5 mg/dL (ref 0.3–1.2)
Total Protein: 7.1 g/dL (ref 6.5–8.1)

## 2019-02-19 LAB — CBC
HCT: 35.7 % — ABNORMAL LOW (ref 36.0–46.0)
Hemoglobin: 12.4 g/dL (ref 12.0–15.0)
MCH: 34 pg (ref 26.0–34.0)
MCHC: 34.7 g/dL (ref 30.0–36.0)
MCV: 97.8 fL (ref 80.0–100.0)
Platelets: 168 10*3/uL (ref 150–400)
RBC: 3.65 MIL/uL — ABNORMAL LOW (ref 3.87–5.11)
RDW: 12.1 % (ref 11.5–15.5)
WBC: 5.2 10*3/uL (ref 4.0–10.5)
nRBC: 0 % (ref 0.0–0.2)

## 2019-02-19 LAB — PROTIME-INR
INR: 1.1 (ref 0.8–1.2)
Prothrombin Time: 13.8 seconds (ref 11.4–15.2)

## 2019-02-19 LAB — APTT: aPTT: 34 seconds (ref 24–36)

## 2019-02-19 SURGERY — INSERTION, TUNNELED CENTRAL VENOUS DEVICE, WITH PORT
Anesthesia: Monitor Anesthesia Care | Laterality: Left

## 2019-02-19 MED ORDER — SODIUM CHLORIDE 0.9 % IV SOLN
INTRAVENOUS | Status: DC | PRN
  Administered 2019-02-19: 08:00:00 10 ug/min via INTRAVENOUS

## 2019-02-19 MED ORDER — PROPOFOL 500 MG/50ML IV EMUL
INTRAVENOUS | Status: DC | PRN
  Administered 2019-02-19: 50 ug/kg/min via INTRAVENOUS

## 2019-02-19 MED ORDER — VANCOMYCIN HCL IN DEXTROSE 1-5 GM/200ML-% IV SOLN
1000.0000 mg | INTRAVENOUS | Status: AC
  Administered 2019-02-19: 07:00:00 1000 mg via INTRAVENOUS

## 2019-02-19 MED ORDER — HEPARIN SODIUM (PORCINE) 1000 UNIT/ML IJ SOLN
INTRAMUSCULAR | Status: AC
  Filled 2019-02-19: qty 1

## 2019-02-19 MED ORDER — MIDAZOLAM HCL 5 MG/5ML IJ SOLN
INTRAMUSCULAR | Status: DC | PRN
  Administered 2019-02-19: 1 mg via INTRAVENOUS

## 2019-02-19 MED ORDER — ACETAMINOPHEN 500 MG PO TABS
1000.0000 mg | ORAL_TABLET | Freq: Once | ORAL | Status: AC
  Administered 2019-02-19: 06:00:00 1000 mg via ORAL

## 2019-02-19 MED ORDER — SODIUM CHLORIDE 0.9 % IV SOLN
INTRAVENOUS | Status: DC | PRN
  Administered 2019-02-19: 500 mL

## 2019-02-19 MED ORDER — LACTATED RINGERS IV SOLN
INTRAVENOUS | Status: DC | PRN
  Administered 2019-02-19: 07:00:00 via INTRAVENOUS

## 2019-02-19 MED ORDER — MIDAZOLAM HCL 2 MG/2ML IJ SOLN
INTRAMUSCULAR | Status: AC
  Filled 2019-02-19: qty 2

## 2019-02-19 MED ORDER — OXYCODONE HCL 5 MG PO TABS: 5.0000 mg | ORAL_TABLET | ORAL | Status: DC | PRN

## 2019-02-19 MED ORDER — ACETAMINOPHEN 500 MG PO TABS
ORAL_TABLET | ORAL | Status: AC
  Administered 2019-02-19: 1000 mg via ORAL
  Filled 2019-02-19: qty 2

## 2019-02-19 MED ORDER — OXYCODONE HCL 5 MG PO TABS: 5.0000 mg | ORAL_TABLET | ORAL | 0 refills | Status: DC | PRN

## 2019-02-19 MED ORDER — HEPARIN SODIUM (PORCINE) 1000 UNIT/ML IJ SOLN
INTRAMUSCULAR | Status: DC | PRN
  Administered 2019-02-19: 10000 [IU] via INTRAVENOUS

## 2019-02-19 MED ORDER — ROCURONIUM BROMIDE 10 MG/ML (PF) SYRINGE
PREFILLED_SYRINGE | INTRAVENOUS | Status: AC
  Filled 2019-02-19: qty 10

## 2019-02-19 MED ORDER — SODIUM CHLORIDE 0.9 % IV SOLN
INTRAVENOUS | Status: AC
  Filled 2019-02-19: qty 1.2

## 2019-02-19 MED ORDER — LIDOCAINE HCL 1 % IJ SOLN
INTRAMUSCULAR | Status: DC | PRN
  Administered 2019-02-19: 10 mL

## 2019-02-19 MED ORDER — LIDOCAINE-PRILOCAINE 2.5-2.5 % EX CREA: 1.0000 "application " | TOPICAL_CREAM | CUTANEOUS | 0 refills | Status: AC | PRN

## 2019-02-19 MED ORDER — LIDOCAINE HCL (PF) 1 % IJ SOLN
INTRAMUSCULAR | Status: AC
  Filled 2019-02-19: qty 30

## 2019-02-19 SURGICAL SUPPLY — 43 items
ADH SKN CLS APL DERMABOND .7 (GAUZE/BANDAGES/DRESSINGS) ×1
BAG DECANTER FOR FLEXI CONT (MISCELLANEOUS) ×2 IMPLANT
BLADE CLIPPER SURG (BLADE) ×1 IMPLANT
CANISTER SUCT 3000ML PPV (MISCELLANEOUS) ×2 IMPLANT
COVER SURGICAL LIGHT HANDLE (MISCELLANEOUS) ×2 IMPLANT
COVER WAND RF STERILE (DRAPES) ×1 IMPLANT
DERMABOND ADVANCED (GAUZE/BANDAGES/DRESSINGS) ×1
DERMABOND ADVANCED .7 DNX12 (GAUZE/BANDAGES/DRESSINGS) ×1 IMPLANT
DRAPE C-ARM 42X72 X-RAY (DRAPES) ×2 IMPLANT
DRAPE CHEST BREAST 15X10 FENES (DRAPES) ×2 IMPLANT
ELECT REM PT RETURN 9FT ADLT (ELECTROSURGICAL) ×2
ELECTRODE REM PT RTRN 9FT ADLT (ELECTROSURGICAL) ×1 IMPLANT
GLOVE BIOGEL PI IND STRL 7.5 (GLOVE) IMPLANT
GLOVE BIOGEL PI INDICATOR 7.5 (GLOVE) ×2
GLOVE SURG SIGNA 7.5 PF LTX (GLOVE) ×1 IMPLANT
GLOVE SURG SS PI 7.0 STRL IVOR (GLOVE) ×1 IMPLANT
GLOVE TRIUMPH SURG SIZE 7.5 (KITS) ×2 IMPLANT
GOWN STRL REUS W/ TWL LRG LVL3 (GOWN DISPOSABLE) ×1 IMPLANT
GOWN STRL REUS W/ TWL XL LVL3 (GOWN DISPOSABLE) ×1 IMPLANT
GOWN STRL REUS W/TWL LRG LVL3 (GOWN DISPOSABLE) ×2
GOWN STRL REUS W/TWL XL LVL3 (GOWN DISPOSABLE) ×2
GUIDEWIRE UNCOATED ST S 7038 (WIRE) IMPLANT
INTRODUCER 13FR (INTRODUCER) IMPLANT
INTRODUCER COOK 11FR (CATHETERS) IMPLANT
KIT BASIN OR (CUSTOM PROCEDURE TRAY) ×2 IMPLANT
KIT PORT POWER 9.6FR MRI PREA (Stent) ×1 IMPLANT
KIT TURNOVER KIT B (KITS) ×2 IMPLANT
NS IRRIG 1000ML POUR BTL (IV SOLUTION) ×2 IMPLANT
PACK GENERAL/GYN (CUSTOM PROCEDURE TRAY) ×2 IMPLANT
PAD ARMBOARD 7.5X6 YLW CONV (MISCELLANEOUS) ×4 IMPLANT
SET SHEATH INTRODUCER 10FR (MISCELLANEOUS) IMPLANT
SUT MNCRL AB 4-0 PS2 18 (SUTURE) IMPLANT
SUT VIC AB 3-0 SH 27 (SUTURE) ×4
SUT VIC AB 3-0 SH 27X BRD (SUTURE) ×2 IMPLANT
SUT VIC AB 4-0 PS2 18 (SUTURE) ×1 IMPLANT
SUT VICRYL 4-0 PS2 18IN ABS (SUTURE) ×1 IMPLANT
SYR 20ML LL LF (SYRINGE) ×2 IMPLANT
SYR 5ML LL (SYRINGE) ×2 IMPLANT
SYR CONTROL 10ML LL (SYRINGE) ×1 IMPLANT
TOWEL GREEN STERILE (TOWEL DISPOSABLE) ×2 IMPLANT
TOWEL GREEN STERILE FF (TOWEL DISPOSABLE) ×2 IMPLANT
WATER STERILE IRR 1000ML POUR (IV SOLUTION) ×2 IMPLANT
WIRE EMERALD 3MM-J .035X150CM (WIRE) IMPLANT

## 2019-02-19 NOTE — Op Note (Signed)
NAME: IRVA, LOSER MEDICAL RECORD [id number] ACCOUNT [id number] DATE OF BIRTH:[birth year] FACILITY: [facility] LOCATION: [facility]PER[facility] PHYSICIAN:Konor Noren C. Rosena Bartle, MD  OPERATIVE REPORT  DATE OF PROCEDURE:  02/19/2019  PREOPERATIVE DIAGNOSIS:  Advanced stage lung cancer, needs IV access for chemotherapy.  POSTOPERATIVE DIAGNOSIS:  Advanced stage lung cancer, needs IV access for chemotherapy.  PROCEDURE:  Port-A-Cath placement, left subclavian vein.  SURGEON:  Modesto Charon, MD  ASSISTANT:  None.  ANESTHESIA:  Local with intravenous sedation.  FINDINGS:  Vein accessed on first pass, catheter in the superior vena cava just above the right atrium.  CLINICAL NOTE:  The patient is a 73 year old woman with a history of tobacco abuse who was recently diagnosed with advanced stage lung cancer.  She is to be treated with chemotherapy and needs IV access for chemotherapy.  The indications, risks,  benefits, and alternatives were discussed in detail with the patient.  She understood the general nature of the procedure as well as the risks, accepts those, and wishes to proceed.  OPERATIVE NOTE:  The patient was brought to the operating room on 02/19/2019.  She was given intravenous sedation and monitored by the anesthesia service.  Intravenous antibiotics were administered.  The chest and neck were prepped and draped in the  usual sterile fashion.  A timeout was performed.  The operative site was anesthetized with 10 mL of 1% lidocaine.  After ensuring adequate local anesthetic effect, the patient was placed in Trendelenburg position.  The left subclavian vein was accessed using the Seldinger  technique.  The vein was accessed on the first pass.  The wire passed easily.  Fluoroscopy confirmed position of the wire in the right atrium.  A 9.6 [place] PowerPort with a preattached catheter was cut to 18 cm and pre-flushed with heparinized saline.   An incision was made incorporating the wire,  and a pocket was created for the port between the subcutaneous tissue and the pectoralis fascia.  A Peel-Away sheath introducer was advanced over the wire.  The catheter was advanced through the Peel-Away  sheath introducer, which was removed.  Fluoroscopy confirmed position of the catheter in the superior vena cava just above the right atrium.  There was good return from the port and the port flushed easily.  The port was tacked to the pectoralis fascia  at 3 sites with 3-0 Vicryl sutures.  It was then flushed with 2.2 mL of concentrated heparin.  Final inspection with fluoroscopy showed the catheter in good position.  There was no pneumothorax.  The total radiation dose was 0.7 mGy.  The incision then was  closed in 2 layers.  Dermabond was applied.  The patient was taken from the operating room to the postanesthetic care unit in good condition.  LN/NUANCE  D:02/19/2019 T:02/19/2019 JOB:[id number]

## 2019-02-19 NOTE — Progress Notes (Signed)
      [address]       ,[place] [address]             [phone]      Mrs. Dial has had reactions to dilaudid and fentanyl in the past She has taken oxycodone previously without any adverse effect Will give her a prescription for oxycodone, but encouraged to use acetaminophen or ibuprofen unless pain is severe  Remo Lipps C. Roxan Hockey, MD [facility] [phone]

## 2019-02-19 NOTE — Brief Op Note (Signed)
02/19/2019  8:36 AM  PATIENT:  Stacy [name]  73 y.o. female  PRE-OPERATIVE DIAGNOSIS:  LUNG CANCER  POST-OPERATIVE DIAGNOSIS:  LUNG CANCER  PROCEDURE:  Procedure(s) with comments: INSERTION PORT-A-CATH (Left) - placed left subclavian  SURGEON:  Surgeon(s) and Role:    * Melrose Nakayama, MD - Primary  PHYSICIAN ASSISTANT:   ASSISTANTS: none   ANESTHESIA:   local and IV sedation  EBL:  minimal   BLOOD ADMINISTERED:none  DRAINS: none   LOCAL MEDICATIONS USED:  LIDOCAINE  and Amount: 10 ml  SPECIMEN:  No Specimen  DISPOSITION OF SPECIMEN:  N/A  COUNTS:  YES  TOURNIQUET:  * No tourniquets in log *  DICTATION: .Other Dictation: Dictation Number -  PLAN OF CARE: Discharge to home after PACU  PATIENT DISPOSITION:  PACU - hemodynamically stable.   Delay start of Pharmacological VTE agent (>24hrs) due to surgical blood loss or risk of bleeding: not applicable

## 2019-02-19 NOTE — Progress Notes (Signed)
[facility] OFFICE PROGRESS NOTE  [name] [place] [address]  DIAGNOSIS: Limited stage (T3, N3, M0) small cell lung cancer presented with right upper lobe lung mass in addition to satellite nodule as well as right hilar, mediastinal and right supraclavicular lymphadenopathy diagnosed in September 2020.  PRIOR THERAPY:   CURRENT THERAPY: Concurrent chemoradiation with carboplatin for AUC of 5 on day 1 and 2 etoposide 120 mg/M2 on days 1, 2 and 3 every 3 weeks with neulasta support. First cycle on 02/12/2019. Status post 1 cycle.   INTERVAL HISTORY: Stacy [name] 73 y.o. female returns to the clinic for a follow-up visit.  The patient was recently diagnosed with limited stage small cell lung cancer. She is currently undergoing concurrent chemoradiation.  She is status post her first cycle of chemotherapy and she tolerated well without any adverse side effects.  She denies any fever, chills, or night sweats.  Per chart review, it appears that the patient has lost 5 lbs since her last appointment 3 weeks ago. The patient denies any changes with her dietary habits.  She states that she drinks approximately 3 boost per day and east small frequent meals.  The patient reports her baseline shortness of breath for which she occasionally will use her inhaler.  She denies any cough or hemoptysis.  The patient has been experiencing right-sided anterior chest pain for which she has been taking ibuprofen and Tylenol without significant relief.  The patient states that her pain is worse with breathing and she characterizes it as soreness.  She denies any nausea, vomiting, diarrhea, or constipation.  She denies any headache or visual changes.  The patient had a Port-A-Cath placed today and she tolerated the procedure well.  The patient does report that she has been having some bilateral pain behind both of her knees.  The patient is scheduled to see her  orthopedic doctor, Dr. Marlou Sa, in 2 days to further evaluate this. The patient is here for a 1 week follow-up visit after completing her first cycle of chemotherapy.  MEDICAL HISTORY: Past Medical History:  Diagnosis Date  . Carotid bruit    Bilateral  . Chest pain    occasionally, takes nitro if needed, at rest (1-2 times a month)  . Colon polyps    with divertirticulitis  . COPD (chronic obstructive pulmonary disease) ([place])   . DJD (degenerative joint disease)   . Emphysema lung ([place])   . Family history of adverse reaction to anesthesia    mom PONV  . Hyperlipidemia   . Hypertension   . Lung cancer ([place])   . Lymph node cancer ([place])   . Osteopenia   . Prediabetes   . Vitamin D deficiency     ALLERGIES:  is allergic to dilaudid [hydromorphone hcl]; fentanyl; penicillins; and crestor [rosuvastatin].  MEDICATIONS:  Current Outpatient Medications  Medication Sig Dispense Refill  . albuterol (PROAIR HFA) 108 (90 Base) MCG/ACT inhaler Inhale 2 puffs into the lungs every 6 (six) hours as needed for wheezing or shortness of breath. 18 g 1  . ALPRAZolam (XANAX) 0.5 MG tablet TAKE 1/2 TO 1 (ONE-HALF TO ONE) TABLET BY MOUTH TWICE DAILY AS NEEDED FOR ANXIETY OR&nbsp;&nbsp;SLEEP 60 tablet 2  . aspirin 81 MG tablet Take 162 mg by mouth daily.     Marland Kitchen atenolol (TENORMIN) 25 MG tablet Take 1 tablet Daily for BP (Patient taking differently: Take 25 mg by mouth daily. ) 90 tablet 3  .  cholecalciferol (VITAMIN D) 1000 units tablet Take 1,000 Units by mouth daily.     . diphenhydramine-acetaminophen (TYLENOL PM) 25-500 MG TABS tablet Take 1 tablet by mouth at bedtime as needed (sleep).     . ezetimibe (ZETIA) 10 MG tablet TAKE 1 TABLET BY MOUTH ONCE DAILY FOR CHOLESTEROL (Patient taking differently: Take 10 mg by mouth daily. ) 90 tablet 0  . isosorbide mononitrate (IMDUR) 30 MG 24 hr tablet Take 30 mg by mouth daily.    Marland Kitchen ibuprofen (ADVIL) 200 MG tablet Take 600 mg by mouth every 6 (six) hours as  needed for headache or mild pain.    Marland Kitchen lidocaine-prilocaine (EMLA) cream Apply 1 application topically as needed. 30 g 0  . nitroGLYCERIN (NITROSTAT) 0.4 MG SL tablet Place 1 tablet (0.4 mg total) under the tongue every 5 (five) minutes as needed for chest pain. (Patient not taking: Reported on 02/19/2019) 30 tablet 3  . oxyCODONE (OXY IR/ROXICODONE) 5 MG immediate release tablet Take 1 tablet (5 mg total) by mouth every 4 (four) hours as needed for moderate pain or breakthrough pain. (Patient not taking: Reported on 02/19/2019) 20 tablet 0  . prochlorperazine (COMPAZINE) 10 MG tablet Take 1 tablet (10 mg total) by mouth every 6 (six) hours as needed for nausea or vomiting. (Patient not taking: Reported on 02/19/2019) 30 tablet 0   No current facility-administered medications for this visit.     SURGICAL HISTORY:  Past Surgical History:  Procedure Laterality Date  . CATARACT EXTRACTION Bilateral 03/17/2017  . COLONOSCOPY    . ESOPHAGOGASTRODUODENOSCOPY    . HAND SURGERY    . head injury  1995  . LAMINECTOMY  2002   L 5   . NECK SURGERY  2012    c4 -5/6  fushion  . TONSILLECTOMY    . TRIGGER FINGER RELEASE    . VIDEO BRONCHOSCOPY WITH ENDOBRONCHIAL ULTRASOUND N/A 01/25/2019   Procedure: VIDEO BRONCHOSCOPY WITH ENDOBRONCHIAL ULTRASOUND;  Surgeon: Melrose Nakayama, MD;  Location: [facility];  Service: Thoracic;  Laterality: N/A;    REVIEW OF SYSTEMS:   Review of Systems  Constitutional: Positive for fatigue, generalized weakness, and 5 lb weight loss since her last appointment 3 weeks ago. Negative for appetite change, chills, and fever.  HENT: Negative for mouth sores, nosebleeds, sore throat and trouble swallowing.   Eyes: Negative for eye problems and icterus.  Respiratory: Positive for baseline shortness of breath. Negative for cough, hemoptysis, and wheezing.   Cardiovascular: Positive for right sided chest pain. Negative for leg swelling.  Gastrointestinal: Negative for abdominal  pain, constipation, diarrhea, nausea and vomiting.  Genitourinary: Negative for bladder incontinence, difficulty urinating, dysuria, frequency and hematuria.   Musculoskeletal: Positive for pain bilaterally behind her knees. Negative for back pain, gait problem, neck pain and neck stiffness.  Skin: Positive for incision from receiving her port-a-cath today. Negative for itching and rash.  Neurological: Negative for dizziness, extremity weakness, gait problem, headaches, light-headedness and seizures.  Hematological: Negative for adenopathy. Does not bruise/bleed easily.  Psychiatric/Behavioral: Negative for confusion, depression and sleep disturbance. The patient is not nervous/anxious.     PHYSICAL EXAMINATION:  Blood pressure 130/88, pulse 78, temperature 98.7 F (37.1 C), temperature source Temporal, resp. rate 17, height 5\' 2"  (1.575 m), weight 110 lb 8 oz (50.1 kg), SpO2 100 %.  ECOG PERFORMANCE STATUS: 1 - Symptomatic but completely ambulatory  Physical Exam  Constitutional: Oriented to person, place, and time and well-developed, well-nourished, and in no distress.  HENT:  Head: Normocephalic and atraumatic.  Mouth/Throat: Oropharynx is clear and moist. No oropharyngeal exudate.  Eyes: Conjunctivae are normal. Right eye exhibits no discharge. Left eye exhibits no discharge. No scleral icterus.  Neck: Normal range of motion. Neck supple.  Cardiovascular: Normal rate, regular rhythm, normal heart sounds and intact distal pulses.   Pulmonary/Chest: Effort normal and breath sounds normal. No respiratory distress. No wheezes. No rales.  Abdominal: Soft. Bowel sounds are normal. Exhibits no distension and no mass. There is no tenderness.  Musculoskeletal: Normal range of motion. Exhibits no edema.  Lymphadenopathy:    No cervical adenopathy.  Neurological: Alert and oriented to person, place, and time. Exhibits normal muscle tone. Gait normal. Coordination normal.  Skin: Skin is warm and  dry. No rash noted. Not diaphoretic. No erythema. No pallor.  Psychiatric: Mood, memory and judgment normal.  Vitals reviewed.  LABORATORY DATA: Lab Results  Component Value Date   WBC 3.1 (L) 02/19/2019   HGB 11.5 (L) 02/19/2019   HCT 33.5 (L) 02/19/2019   MCV 97.1 02/19/2019   PLT 130 (L) 02/19/2019      Chemistry      Component Value Date/Time   NA 130 (L) 02/19/2019 1436   K 4.1 02/19/2019 1436   CL 96 (L) 02/19/2019 1436   CO2 20 (L) 02/19/2019 1436   BUN 19 02/19/2019 1436   CREATININE 0.76 02/19/2019 1436   CREATININE 0.80 11/27/2018 1522      Component Value Date/Time   CALCIUM 9.3 02/19/2019 1436   ALKPHOS 70 02/19/2019 1436   AST 14 (L) 02/19/2019 1436   ALT 9 02/19/2019 1436   BILITOT 0.5 02/19/2019 1436       RADIOGRAPHIC STUDIES:  Dg Chest 2 View  Result Date: 02/19/2019 CLINICAL DATA:  Preop for lobectomy. Lung cancer. EXAM: CHEST - 2 VIEW COMPARISON:  Radiograph 01/22/2019. CT 12/11/2018 FINDINGS: Subpleural opacity in the periphery of the right upper lobe corresponds to pulmonary mass on prior CT. Linear density extending to the right hilum with hilar prominence is also unchanged. Underlying emphysema. No pulmonary edema, new airspace disease, large pleural effusion or pneumothorax. Heart is normal in size. Aortic atherosclerosis. IMPRESSION: 1. Right upper lobe pulmonary mass and linear opacity extending to the hilum, unchanged from prior imaging. 2. Emphysema. 3. No acute abnormality. Electronically Signed   By: Keith Rake M.D.   On: 02/19/2019 06:48   Dg Chest 2 View  Result Date: 01/23/2019 CLINICAL DATA:  Preoperative exam for bronchoscopy. EXAM: CHEST - 2 VIEW COMPARISON:  CT 12/11/2018.  Chest x-ray 04/01/2017. FINDINGS: Mediastinal and right hilar mass. Right upper lobe mass. Similar findings noted on recent CT. Chronic interstitial prominence again noted. No focal alveolar infiltrate. No pleural effusion or pneumothorax. Heart size normal.  Degenerative change scoliosis thoracic spine. IMPRESSION: 1. Mediastinum and right hilar mass. Right upper lobe mass. Similar findings noted on recent CT and again are again consistent with malignancy. 2.  Chronic interstitial lung disease. Electronically Signed   By: Marcello Moores  Register   On: 01/23/2019 07:00   Dg Fluoro Guide Cv Line-no Report  Result Date: 02/19/2019 Fluoroscopy was utilized by the requesting physician.  No radiographic interpretation.     ASSESSMENT/PLAN:  This is a very pleasant 73 year old Caucasian female diagnosed with limited stage small cell lung cancer, she presented with a right upper lobe lung mass in addition to satellite nodules as well as right hilar, mediastinal, and right supraclavicular lymphadenopathy.  She was diagnosed in September 2020.  The patient  is currently undergoing systemic chemotherapy with carboplatin for an AUC of 5 on day 1 and etoposide on days 1, 2, and 3 IV every 3 weeks with Neulasta support.  She is also undergoing concurrent radiation.  The patient tolerated her first treatment well without any adverse side effects.   Labs were reviewed.  Recommend that she proceed with cycle # 2 in 2 weeks as scheduled.  We will continue to monitor the patient's weight closely at subsequent visits. She was encouraged to continue to increase her calorie intake as well as drink boost.  The patient will continue with her current pain medication regimen for her right sided chest pain.  The patient was given a prescription of oxycodone by her cardiothoracic surgeon today and will take it as needed for pain.  I sent a prescription for EMLA cream to the patient's pharmacy for her Port-A-Cath.  The patient was advised to call immediately if she has any concerning symptoms in the interval. The patient voices understanding of current disease status and treatment options and is in agreement with the current care plan. All questions were answered. The patient knows  to call the clinic with any problems, questions or concerns. We can certainly see the patient much sooner if necessary  No orders of the defined types were placed in this encounter.    Earlin Sweeden L Teya Otterson, PA-C 02/19/19

## 2019-02-19 NOTE — Transfer of Care (Cosign Needed)
Immediate Anesthesia Transfer of Care Note  Patient: Stacy [name]  Procedure(s) Performed: INSERTION PORT-A-CATH (Left )  Patient Location: PACU  Anesthesia Type:MAC  Level of Consciousness: awake, alert  and oriented  Airway & Oxygen Therapy: Patient Spontanous Breathing  Post-op Assessment: Report given to RN, Post -op Vital signs reviewed and stable and Patient moving all extremities  Post vital signs: Reviewed and stable  Last Vitals:  Vitals Value Taken Time  BP    Temp    Pulse 75 02/19/19 0840  Resp 23 02/19/19 0840  SpO2 100 % 02/19/19 0840  Vitals shown include unvalidated device data.  Last Pain:  Vitals:   02/19/19 0624  PainSc: 8       Patients Stated Pain Goal: 2 (84/13/24 4010)  Complications: No apparent anesthesia complications

## 2019-02-19 NOTE — Anesthesia Procedure Notes (Signed)
Procedure Name: MAC Date/Time: 02/19/2019 7:39 AM Performed by: Neldon Newport, CRNA Pre-anesthesia Checklist: Timeout performed, Patient being monitored, Suction available, Emergency Drugs available and Patient identified Patient Re-evaluated:Patient Re-evaluated prior to induction Oxygen Delivery Method: Simple face mask

## 2019-02-19 NOTE — Discharge Instructions (Addendum)
Do not drive or engage in heavy physical activity for 24 hours  You may resume normal activities tomorrow  You may shower tomorrow  There is a medical adhesive over the incision. It will begin to peel off in 10-14 days  You may use acetaminophen (Tylenol) or ibuprofen (Advil) as needed for pain. You have a prescription for oxycodone, a narcotic pain reliever. You may use if needed for severe pain. Use caution as there can be cross reaction between dilaudid and oxycodone, although you indicated you have used percocet (which is a combination of oxycodone and acetaminophen) without issue in the past.  You may apply ice packs to incision for 20 minutes 4 times daily  Call [phone] if you develop chest pain, shortness of breath, fever > 101 F or notice redness, swelling, excessive pain or drainage form the incision

## 2019-02-19 NOTE — Progress Notes (Deleted)
Per Dr Julien Nordmann no injection needed for pt today

## 2019-02-19 NOTE — H&P (Signed)
Stacy [name] is an 73 y.o. female.   Chief Complaint: needs IV access HPI: 73 yo woman with advanced stage lung cancer needs IV access for chemotherapy.  Past Medical History:  Diagnosis Date  . Carotid bruit    Bilateral  . Chest pain    occasionally, takes nitro if needed, at rest (1-2 times a month)  . Colon polyps    with divertirticulitis  . COPD (chronic obstructive pulmonary disease) ([place])   . DJD (degenerative joint disease)   . Emphysema lung ([place])   . Family history of adverse reaction to anesthesia    mom PONV  . Hyperlipidemia   . Hypertension   . Lung cancer ([place])   . Lymph node cancer ([place])   . Osteopenia   . Prediabetes   . Vitamin D deficiency     Past Surgical History:  Procedure Laterality Date  . CATARACT EXTRACTION Bilateral 03/17/2017  . COLONOSCOPY    . ESOPHAGOGASTRODUODENOSCOPY    . HAND SURGERY    . head injury  1995  . LAMINECTOMY  2002   L 5   . NECK SURGERY  2012    c4 -5/6  fushion  . TONSILLECTOMY    . TRIGGER FINGER RELEASE    . VIDEO BRONCHOSCOPY WITH ENDOBRONCHIAL ULTRASOUND N/A 01/25/2019   Procedure: VIDEO BRONCHOSCOPY WITH ENDOBRONCHIAL ULTRASOUND;  Surgeon: Melrose Nakayama, MD;  Location: [facility] OR;  Service: Thoracic;  Laterality: N/A;    Family History  Problem Relation Age of Onset  . Hyperlipidemia Mother   . Heart disease Mother        7 way bypass/ value replacement/pacemaker  . Heart attack Mother 16  . Diabetes Mother   . Cancer - Other Mother        lung  . Hypertension Father   . Heart attack Father 2  . Cancer - Other Father        throat   Social History:  reports that she has been smoking. She has been smoking about 1.00 pack per day. She has never used smokeless tobacco. She reports that she does not drink alcohol or use drugs.  Allergies:  Allergies  Allergen Reactions  . Dilaudid [Hydromorphone Hcl] Shortness Of Breath and Swelling  . Fentanyl Shortness Of Breath and Swelling  . Penicillins  Anaphylaxis    (Had skin test at [facility]) Did it involve swelling of the face/tongue/throat, SOB, or low BP? Yes  +++CARDIAC ARREST+++ Did it involve sudden or severe rash/hives, skin peeling, or any reaction on the inside of your mouth or nose? No Did you need to seek medical attention at a hospital or doctor's office? Yes When did it last happen? Many years ago If all above answers are "NO", may proceed with cephalosporin use.   . Crestor [Rosuvastatin] Other (See Comments)    Joint pain, cramping     Medications Prior to Admission  Medication Sig Dispense Refill  . albuterol (PROAIR HFA) 108 (90 Base) MCG/ACT inhaler Inhale 2 puffs into the lungs every 6 (six) hours as needed for wheezing or shortness of breath. 18 g 1  . ALPRAZolam (XANAX) 0.5 MG tablet TAKE 1/2 TO 1 (ONE-HALF TO ONE) TABLET BY MOUTH TWICE DAILY AS NEEDED FOR ANXIETY OR&nbsp;&nbsp;SLEEP 60 tablet 2  . aspirin 81 MG tablet Take 162 mg by mouth daily.     Marland Kitchen atenolol (TENORMIN) 25 MG tablet Take 1 tablet Daily for BP (Patient taking differently: Take 25 mg by mouth daily. ) 90 tablet  3  . cholecalciferol (VITAMIN D) 1000 units tablet Take 1,000 Units by mouth daily.     . diphenhydramine-acetaminophen (TYLENOL PM) 25-500 MG TABS tablet Take 1 tablet by mouth at bedtime as needed (sleep).     . ezetimibe (ZETIA) 10 MG tablet TAKE 1 TABLET BY MOUTH ONCE DAILY FOR CHOLESTEROL (Patient taking differently: Take 10 mg by mouth daily. ) 90 tablet 0  . ibuprofen (ADVIL) 200 MG tablet Take 600 mg by mouth every 6 (six) hours as needed for headache or mild pain.    . isosorbide mononitrate (IMDUR) 30 MG 24 hr tablet Take 30 mg by mouth daily.    . nitroGLYCERIN (NITROSTAT) 0.4 MG SL tablet Place 1 tablet (0.4 mg total) under the tongue every 5 (five) minutes as needed for chest pain. 30 tablet 3  . prochlorperazine (COMPAZINE) 10 MG tablet Take 1 tablet (10 mg total) by mouth every 6 (six) hours as needed for nausea or vomiting. 30  tablet 0    Results for orders placed or performed during the hospital encounter of 02/19/19 (from the past 48 hour(s))  APTT     Status: None   Collection Time: 02/19/19  6:40 AM  Result Value Ref Range   aPTT 34 24 - 36 seconds    Comment: Performed at [facility] [address], [place], [place] [address]  CBC     Status: Abnormal   Collection Time: 02/19/19  6:40 AM  Result Value Ref Range   WBC 5.2 4.0 - 10.5 K/uL   RBC 3.65 (L) 3.87 - 5.11 MIL/uL   Hemoglobin 12.4 12.0 - 15.0 g/dL   HCT 35.7 (L) 36.0 - 46.0 %   MCV 97.8 80.0 - 100.0 fL   MCH 34.0 26.0 - 34.0 pg   MCHC 34.7 30.0 - 36.0 g/dL   RDW 12.1 11.5 - 15.5 %   Platelets 168 150 - 400 K/uL   nRBC 0.0 0.0 - 0.2 %    Comment: Performed at [facility], [place] [address], [place], [place] [address]  Protime-INR     Status: None   Collection Time: 02/19/19  6:40 AM  Result Value Ref Range   Prothrombin Time 13.8 11.4 - 15.2 seconds   INR 1.1 0.8 - 1.2    Comment: (NOTE) INR goal varies based on device and disease states. Performed at [facility], [place] [address], [place], [place] [address]    Dg Chest 2 View  Result Date: 02/19/2019 CLINICAL DATA:  Preop for lobectomy. Lung cancer. EXAM: CHEST - 2 VIEW COMPARISON:  Radiograph 01/22/2019. CT 12/11/2018 FINDINGS: Subpleural opacity in the periphery of the right upper lobe corresponds to pulmonary mass on prior CT. Linear density extending to the right hilum with hilar prominence is also unchanged. Underlying emphysema. No pulmonary edema, new airspace disease, large pleural effusion or pneumothorax. Heart is normal in size. Aortic atherosclerosis. IMPRESSION: 1. Right upper lobe pulmonary mass and linear opacity extending to the hilum, unchanged from prior imaging. 2. Emphysema. 3. No acute abnormality. Electronically Signed   By: Keith Rake M.D.   On: 02/19/2019 06:48    ROS  Blood pressure 108/61, pulse 84, temperature 98.7 F (37.1 C), resp.  rate 17, height 5\' 2"  (1.575 m), weight 51 kg, SpO2 97 %. Physical Exam  Vitals reviewed. Constitutional: She is oriented to person, place, and time. No distress.  Cachectic  Eyes: EOM are normal. No scleral icterus.  Neck: Neck supple. No  JVD present.  Cardiovascular: Normal rate, regular rhythm and normal heart sounds.  No murmur heard. Respiratory: Effort normal and breath sounds normal. No respiratory distress. She has no wheezes.  Musculoskeletal:        General: No edema.  Neurological: She is alert and oriented to person, place, and time. No cranial nerve deficit. She exhibits normal muscle tone.  Skin: Skin is warm and dry.     Assessment/Plan 73 yo woman with a past history of tobacco abuse, COPD, ILD, pulmonary fibrosis, hypertension, hyperlipidemia, and CAD. Recent diagnosis of advanced stage lung cancer and needs IV access for chemo. I informed her of the general nature of the procedure. I informed her of the indications, risks, benefits and alternatives. She understands the risks include but are not limited to bleeding, pneumothorax, infection, venous thrombosis as well as the possibility of other unforeseeable complications.  She accepts the risks and agrees to proceed  Melrose Nakayama, MD 02/19/2019, 7:29 AM

## 2019-02-19 NOTE — Anesthesia Postprocedure Evaluation (Signed)
Anesthesia Post Note  Patient: Stacy [name]  Procedure(s) Performed: INSERTION PORT-A-CATH (Left )     Patient location during evaluation: PACU Anesthesia Type: MAC Level of consciousness: awake and alert Pain management: pain level controlled Vital Signs Assessment: post-procedure vital signs reviewed and stable Respiratory status: spontaneous breathing, nonlabored ventilation and respiratory function stable Cardiovascular status: stable and blood pressure returned to baseline Postop Assessment: no apparent nausea or vomiting Anesthetic complications: no    Last Vitals:  Vitals:   02/19/19 0935 02/19/19 0940  BP: 97/63 (!) 104/55  Pulse: 72 71  Resp: 15 15  Temp:  36.5 C  SpO2: 99% 99%    Last Pain:  Vitals:   02/19/19 0940  PainSc: 0-No pain                 Hatsue Sime,W. EDMOND

## 2019-02-20 ENCOUNTER — Encounter: Payer: Self-pay | Admitting: *Deleted

## 2019-02-20 ENCOUNTER — Encounter (HOSPITAL_COMMUNITY): Payer: Self-pay | Admitting: Thoracic Surgery (Cardiothoracic Vascular Surgery)

## 2019-02-20 ENCOUNTER — Ambulatory Visit
Admission: RE | Admit: 2019-02-20 | Discharge: 2019-02-20 | Disposition: A | Discharge status: Home / Self Care | Payer: Medicare HMO | Source: Ambulatory Visit | Attending: Radiation Oncology | Admitting: Radiation Oncology

## 2019-02-20 ENCOUNTER — Other Ambulatory Visit: Payer: Self-pay

## 2019-02-20 DIAGNOSIS — C3411 Malignant neoplasm of upper lobe, right bronchus or lung: Secondary | ICD-10-CM | POA: Diagnosis not present

## 2019-02-20 DIAGNOSIS — Z510 Encounter for antineoplastic radiation therapy: Secondary | ICD-10-CM | POA: Diagnosis not present

## 2019-02-20 NOTE — Progress Notes (Signed)
Oncology Nurse Navigator Documentation  Oncology Nurse Navigator Flowsheets 02/20/2019  Diagnosis Status Confirmed Diagnosis Complete  Phase of Treatment Chemo/Radiation Concurrent  Navigator Follow Up Date: 03/05/2019  Navigator Follow Up Reason: Follow-up Appointment  Navigator Location [facility]  Referral Date to RadOnc/MedOnc -  Navigator Encounter Type Other/I followed up on patient's schedule. Her schedule is set per treatment plan.  No other barriers identified at this time.   Telephone -  Abnormal Finding Date 12/11/2018  Confirmed Diagnosis Date 01/25/2019  Multidisiplinary Clinic Date -  Treatment Initiated Date -  Patient Visit Type -  Treatment Phase Treatment  Barriers/Navigation Needs Coordination of Care  Education -  Interventions Coordination of Care  Coordination of Care Other  Education Method -  Acuity Level 2-Minimal Needs (1-2 Barriers Identified)  Time Spent with Patient 30

## 2019-02-21 ENCOUNTER — Ambulatory Visit: Payer: Medicare HMO

## 2019-02-21 ENCOUNTER — Encounter: Payer: Self-pay | Admitting: Family Medicine

## 2019-02-21 ENCOUNTER — Telehealth: Payer: Self-pay | Admitting: Internal Medicine

## 2019-02-21 ENCOUNTER — Ambulatory Visit (INDEPENDENT_AMBULATORY_CARE_PROVIDER_SITE_OTHER): Payer: Medicare HMO | Admitting: Family Medicine

## 2019-02-21 DIAGNOSIS — M79605 Pain in left leg: Secondary | ICD-10-CM

## 2019-02-21 DIAGNOSIS — M79604 Pain in right leg: Secondary | ICD-10-CM | POA: Diagnosis not present

## 2019-02-21 DIAGNOSIS — R29898 Other symptoms and signs involving the musculoskeletal system: Secondary | ICD-10-CM | POA: Diagnosis not present

## 2019-02-21 NOTE — Telephone Encounter (Signed)
Message to [facility] re injection appointments. No order in care plan.

## 2019-02-21 NOTE — Telephone Encounter (Signed)
Added additional appointments for November and December. Other appointments remain the same. Left message for patient confirming appointments. Patient to get updated schedule 10/26 visit.

## 2019-02-21 NOTE — Progress Notes (Signed)
Office Visit Note   Patient: Stacy [name]           Date of Birth: 31-Aug-1945           MRN: [id number] Visit Date: 02/21/2019 Requested by: [name], [address] [place],  [place] [address] PCP: [name], MD  Subjective: Chief Complaint  Patient presents with  . pain "in all my muscles" of my legs, r > l, since 11/2018    HPI: She is here with bilateral leg weakness and muscle pain.  Symptoms started around July or August, initially she was having intermittent muscle spasms in her thigh muscles.  With time, she began noticing weakness in her legs when walking.  She states that if she walks with her knees straight, she seems to be okay but as soon as she tries to bend her knees, they will buckle.  Now the weakness has progressed so that she has difficulty flexing her hips.  She denies any numbness in her legs, denies any low back pain, denies any bowel or bladder dysfunction.  She is currently being treated for lung cancer with radiation and chemotherapy, but these symptoms started before her diagnosis and treatment.  She had labs drawn in July which were unremarkable.  Denies any headache, visual disturbance, fevers or chills.  She does note that her arms are starting to feel slightly weak but nowhere near as bad as her legs.              ROS:   All other systems were reviewed and are negative.  Objective: Vital Signs: There were no vitals taken for this visit.  Physical Exam:  General:  Alert and oriented, in no acute distress. Pulm:  Breathing unlabored. Psy:  Normal mood, congruent affect. Skin: No visible rash. Legs: She has no lumbar tenderness.  Good range of motion of both hips with no significant pain on passive motion.  She definitely has weakness with hip flexion on both sides, 4/5.  She is also weak with knee extension, able to actively straighten her knees but cannot resist against me with any strength.  Ankle dorsiflexion, eversion and  plantarflexion strength is normal bilaterally.  No tenderness to palpation of the thigh or lower leg muscles.  Imaging: None today.  Assessment & Plan: 1.  Bilateral leg weakness, etiology uncertain.  Weakness is symmetric. -We will start with nerve conduction studies of both lower extremities.  We will do additional work-up based on those findings.     Procedures: No procedures performed  No notes on file     PMFS History: Patient Active Problem List   Diagnosis Date Noted  . Small cell lung cancer, overlapping sites of right lung ([place]) 01/29/2019  . Encounter for antineoplastic chemotherapy 01/29/2019  . Goals of care, counseling/discussion 01/29/2019  . Exertional chest pain 01/23/2019  . Peripheral artery disease ([place]) 01/23/2019  . Malignant neoplasm of upper lobe of right lung ([place]) 01/11/2019  . Insomnia 05/19/2018  . Smokers' cough ([place]) 06/23/2017  . Atherosclerosis of aorta ([place]) 06/23/2017  . History of diverticulitis 06/23/2017  . COPD (chronic obstructive pulmonary disease) ([place]) 04/02/2017  . Interstitial lung disease ([place]) 04/02/2017  . Tobacco abuse 03/23/2017  . BMI 20.0-20.9, adult 02/17/2015  . Poor compliance with medication 02/17/2015  . Medication management 06/10/2014  . Hypertension   . Mixed hyperlipidemia   . Vitamin D deficiency   . Prediabetes   . DJD (degenerative joint disease)    Past  Medical History:  Diagnosis Date  . Carotid bruit    Bilateral  . Chest pain    occasionally, takes nitro if needed, at rest (1-2 times a month)  . Colon polyps    with divertirticulitis  . COPD (chronic obstructive pulmonary disease) ([place])   . DJD (degenerative joint disease)   . Emphysema lung ([place])   . Family history of adverse reaction to anesthesia    mom PONV  . Hyperlipidemia   . Hypertension   . Lung cancer ([place])   . Lymph node cancer ([place])   . Osteopenia   . Prediabetes   . Vitamin D deficiency     Family History  Problem Relation  Age of Onset  . Hyperlipidemia Mother   . Heart disease Mother        7 way bypass/ value replacement/pacemaker  . Heart attack Mother 38  . Diabetes Mother   . Cancer - Other Mother        lung  . Hypertension Father   . Heart attack Father 14  . Cancer - Other Father        throat    Past Surgical History:  Procedure Laterality Date  . CATARACT EXTRACTION Bilateral 03/17/2017  . COLONOSCOPY    . ESOPHAGOGASTRODUODENOSCOPY    . HAND SURGERY    . head injury  1995  . LAMINECTOMY  2002   L 5   . NECK SURGERY  2012    c4 -5/6  fushion  . PORTACATH PLACEMENT Left 02/19/2019   Procedure: INSERTION PORT-A-CATH;  Surgeon: Melrose Nakayama, MD;  Location: [facility];  Service: Thoracic;  Laterality: Left;  placed left subclavian  . TONSILLECTOMY    . TRIGGER FINGER RELEASE    . VIDEO BRONCHOSCOPY WITH ENDOBRONCHIAL ULTRASOUND N/A 01/25/2019   Procedure: VIDEO BRONCHOSCOPY WITH ENDOBRONCHIAL ULTRASOUND;  Surgeon: Melrose Nakayama, MD;  Location: [facility];  Service: Thoracic;  Laterality: N/A;   Social History   Occupational History  . Not on file  Tobacco Use  . Smoking status: Current Every Day Smoker    Packs/day: 1.00  . Smokeless tobacco: Never Used  . Tobacco comment: smokes 2 cigarettes per day  Substance and Sexual Activity  . Alcohol use: No  . Drug use: Never  . Sexual activity: Not on file

## 2019-02-22 ENCOUNTER — Ambulatory Visit
Admission: RE | Admit: 2019-02-22 | Discharge: 2019-02-22 | Disposition: A | Discharge status: Home / Self Care | Payer: Medicare HMO | Source: Ambulatory Visit | Attending: Radiation Oncology | Admitting: Radiation Oncology

## 2019-02-22 ENCOUNTER — Other Ambulatory Visit: Payer: Self-pay

## 2019-02-22 DIAGNOSIS — Z510 Encounter for antineoplastic radiation therapy: Secondary | ICD-10-CM | POA: Diagnosis not present

## 2019-02-22 DIAGNOSIS — C3411 Malignant neoplasm of upper lobe, right bronchus or lung: Secondary | ICD-10-CM | POA: Diagnosis not present

## 2019-02-23 ENCOUNTER — Ambulatory Visit
Admission: RE | Admit: 2019-02-23 | Discharge: 2019-02-23 | Disposition: A | Discharge status: Home / Self Care | Payer: Medicare HMO | Source: Ambulatory Visit | Attending: Radiation Oncology | Admitting: Radiation Oncology

## 2019-02-23 ENCOUNTER — Other Ambulatory Visit: Payer: Self-pay

## 2019-02-23 DIAGNOSIS — Z510 Encounter for antineoplastic radiation therapy: Secondary | ICD-10-CM | POA: Diagnosis not present

## 2019-02-23 DIAGNOSIS — C3411 Malignant neoplasm of upper lobe, right bronchus or lung: Secondary | ICD-10-CM | POA: Diagnosis not present

## 2019-02-26 ENCOUNTER — Other Ambulatory Visit: Payer: Self-pay | Admitting: Physician Assistant

## 2019-02-26 ENCOUNTER — Inpatient Hospital Stay: Payer: Medicare HMO

## 2019-02-26 ENCOUNTER — Telehealth: Payer: Self-pay | Admitting: Medical Oncology

## 2019-02-26 ENCOUNTER — Other Ambulatory Visit: Payer: Medicare HMO

## 2019-02-26 ENCOUNTER — Other Ambulatory Visit: Payer: Self-pay

## 2019-02-26 ENCOUNTER — Ambulatory Visit (HOSPITAL_BASED_OUTPATIENT_CLINIC_OR_DEPARTMENT_OTHER): Payer: Medicare HMO | Admitting: Medical

## 2019-02-26 ENCOUNTER — Ambulatory Visit
Admission: RE | Admit: 2019-02-26 | Discharge: 2019-02-26 | Disposition: A | Discharge status: Home / Self Care | Payer: Medicare HMO | Source: Ambulatory Visit | Attending: Radiation Oncology | Admitting: Radiation Oncology

## 2019-02-26 ENCOUNTER — Other Ambulatory Visit: Payer: Self-pay | Admitting: Medical

## 2019-02-26 VITALS — BP 104/73 | HR 83 | Temp 97.8°F | Resp 18

## 2019-02-26 DIAGNOSIS — D701 Agranulocytosis secondary to cancer chemotherapy: Secondary | ICD-10-CM

## 2019-02-26 DIAGNOSIS — K1230 Oral mucositis (ulcerative), unspecified: Secondary | ICD-10-CM

## 2019-02-26 DIAGNOSIS — C3481 Malignant neoplasm of overlapping sites of right bronchus and lung: Secondary | ICD-10-CM

## 2019-02-26 DIAGNOSIS — Z95828 Presence of other vascular implants and grafts: Secondary | ICD-10-CM

## 2019-02-26 DIAGNOSIS — T451X5A Adverse effect of antineoplastic and immunosuppressive drugs, initial encounter: Secondary | ICD-10-CM | POA: Insufficient documentation

## 2019-02-26 DIAGNOSIS — Z5111 Encounter for antineoplastic chemotherapy: Secondary | ICD-10-CM | POA: Diagnosis not present

## 2019-02-26 DIAGNOSIS — Z510 Encounter for antineoplastic radiation therapy: Secondary | ICD-10-CM | POA: Diagnosis not present

## 2019-02-26 DIAGNOSIS — C3411 Malignant neoplasm of upper lobe, right bronchus or lung: Secondary | ICD-10-CM | POA: Diagnosis not present

## 2019-02-26 LAB — CBC WITH DIFFERENTIAL (CANCER CENTER ONLY)
Abs Immature Granulocytes: 0 10*3/uL (ref 0.00–0.07)
Basophils Absolute: 0 10*3/uL (ref 0.0–0.1)
Basophils Relative: 0 %
Eosinophils Absolute: 0 10*3/uL (ref 0.0–0.5)
Eosinophils Relative: 2 %
HCT: 32.6 % — ABNORMAL LOW (ref 36.0–46.0)
Hemoglobin: 11.3 g/dL — ABNORMAL LOW (ref 12.0–15.0)
Immature Granulocytes: 0 %
Lymphocytes Relative: 64 %
Lymphs Abs: 0.8 10*3/uL (ref 0.7–4.0)
MCH: 33.3 pg (ref 26.0–34.0)
MCHC: 34.7 g/dL (ref 30.0–36.0)
MCV: 96.2 fL (ref 80.0–100.0)
Monocytes Absolute: 0.1 10*3/uL (ref 0.1–1.0)
Monocytes Relative: 11 %
Neutro Abs: 0.3 10*3/uL — CL (ref 1.7–7.7)
Neutrophils Relative %: 23 %
Platelet Count: 50 10*3/uL — ABNORMAL LOW (ref 150–400)
RBC: 3.39 MIL/uL — ABNORMAL LOW (ref 3.87–5.11)
RDW: 11.6 % (ref 11.5–15.5)
WBC Count: 1.2 10*3/uL — ABNORMAL LOW (ref 4.0–10.5)
nRBC: 0 % (ref 0.0–0.2)

## 2019-02-26 LAB — CMP (CANCER CENTER ONLY)
ALT: 10 U/L (ref 0–44)
AST: 14 U/L — ABNORMAL LOW (ref 15–41)
Albumin: 3.4 g/dL — ABNORMAL LOW (ref 3.5–5.0)
Alkaline Phosphatase: 78 U/L (ref 38–126)
Anion gap: 12 (ref 5–15)
BUN: 14 mg/dL (ref 8–23)
CO2: 26 mmol/L (ref 22–32)
Calcium: 9.5 mg/dL (ref 8.9–10.3)
Chloride: 97 mmol/L — ABNORMAL LOW (ref 98–111)
Creatinine: 0.76 mg/dL (ref 0.44–1.00)
GFR, Est AFR Am: >60 mL/min (ref >60)
GFR, Estimated: >60 mL/min (ref >60)
Glucose, Bld: 123 mg/dL — ABNORMAL HIGH (ref 70–99)
Potassium: 4.3 mmol/L (ref 3.5–5.1)
Sodium: 135 mmol/L (ref 135–145)
Total Bilirubin: 0.3 mg/dL (ref 0.3–1.2)
Total Protein: 7.4 g/dL (ref 6.5–8.1)

## 2019-02-26 MED ORDER — MAGIC MOUTHWASH: ORAL | 1 refills | Status: DC

## 2019-02-26 MED ORDER — SODIUM CHLORIDE 0.9% FLUSH
10.0000 mL | INTRAVENOUS | Status: DC | PRN
  Administered 2019-02-26: 10 mL via INTRAVENOUS
  Filled 2019-02-26: qty 10

## 2019-02-26 MED ORDER — FILGRASTIM-SNDZ 300 MCG/0.5ML IJ SOSY
300.0000 ug | PREFILLED_SYRINGE | Freq: Once | INTRAMUSCULAR | Status: AC
  Administered 2019-02-26: 300 ug via SUBCUTANEOUS
  Filled 2019-02-26: qty 0.5

## 2019-02-26 MED ORDER — LIDOCAINE VISCOUS HCL 2 % MT SOLN: OROMUCOSAL | 1 refills | Status: AC

## 2019-02-26 MED ORDER — HEPARIN SOD (PORK) LOCK FLUSH 100 UNIT/ML IV SOLN
500.0000 [IU] | Freq: Once | INTRAVENOUS | Status: AC
  Administered 2019-02-26: 500 [IU] via INTRAVENOUS
  Filled 2019-02-26: qty 5

## 2019-02-26 NOTE — Telephone Encounter (Signed)
Neutrapenic. Pt coming back in for granix today .

## 2019-02-26 NOTE — Patient Instructions (Signed)

## 2019-02-27 ENCOUNTER — Ambulatory Visit
Admission: RE | Admit: 2019-02-27 | Discharge: 2019-02-27 | Disposition: A | Discharge status: Home / Self Care | Payer: Medicare HMO | Source: Ambulatory Visit | Attending: Radiation Oncology | Admitting: Radiation Oncology

## 2019-02-27 ENCOUNTER — Other Ambulatory Visit: Payer: Self-pay

## 2019-02-27 DIAGNOSIS — Z510 Encounter for antineoplastic radiation therapy: Secondary | ICD-10-CM | POA: Diagnosis not present

## 2019-02-27 DIAGNOSIS — C3411 Malignant neoplasm of upper lobe, right bronchus or lung: Secondary | ICD-10-CM | POA: Diagnosis not present

## 2019-02-27 MED ORDER — MAGIC MOUTHWASH: ORAL | 1 refills | Status: AC

## 2019-02-27 MED FILL — MAGIC MOUTHWASH N/B/M: 12 days supply | Qty: 240 | Fill #0

## 2019-02-27 NOTE — Progress Notes (Signed)
Udenyca added to cycle to and subsequent cycles per Cassie/Dr. Julien Nordmann due to neutropenia after cycle 1.   Demetrius Charity, PharmD, [facility] Pharmacist Pharmacy Phone: [phone] 02/27/2019

## 2019-02-27 NOTE — Progress Notes (Signed)
The patient was seen in the infusion room and was noted to have mucositis which was primarily localized on the mucous membrane internal to the mouth. Prescriptions for viscous lidocaine and Magic Mouthwash was sent to her pharmacy.  Sandi Mealy, MHS, PA-C Physician Assistant

## 2019-02-28 ENCOUNTER — Ambulatory Visit: Payer: Medicare HMO

## 2019-03-01 ENCOUNTER — Ambulatory Visit: Payer: Medicare HMO

## 2019-03-02 ENCOUNTER — Telehealth: Payer: Self-pay

## 2019-03-02 ENCOUNTER — Other Ambulatory Visit: Payer: Self-pay

## 2019-03-02 ENCOUNTER — Ambulatory Visit
Admission: RE | Admit: 2019-03-02 | Discharge: 2019-03-02 | Disposition: A | Discharge status: Home / Self Care | Payer: Medicare HMO | Source: Ambulatory Visit | Attending: Radiation Oncology | Admitting: Radiation Oncology

## 2019-03-02 DIAGNOSIS — Z510 Encounter for antineoplastic radiation therapy: Secondary | ICD-10-CM | POA: Diagnosis not present

## 2019-03-02 DIAGNOSIS — C3411 Malignant neoplasm of upper lobe, right bronchus or lung: Secondary | ICD-10-CM | POA: Diagnosis not present

## 2019-03-02 NOTE — Telephone Encounter (Signed)
TC from Pt stating that she would like a prescription form oxycodone-acetaminophen sent to [facility], In basket message sent  to Cassie H to review. Return call to Pt to inform her that she would have to call Dr. Koleen Nimrod who put in her port for that request.

## 2019-03-05 ENCOUNTER — Inpatient Hospital Stay: Payer: Medicare HMO | Attending: Internal Medicine

## 2019-03-05 ENCOUNTER — Encounter: Payer: Self-pay | Admitting: Internal Medicine

## 2019-03-05 ENCOUNTER — Other Ambulatory Visit: Payer: Self-pay

## 2019-03-05 ENCOUNTER — Inpatient Hospital Stay (HOSPITAL_BASED_OUTPATIENT_CLINIC_OR_DEPARTMENT_OTHER): Payer: Medicare HMO | Admitting: Internal Medicine

## 2019-03-05 ENCOUNTER — Inpatient Hospital Stay: Payer: Medicare HMO

## 2019-03-05 ENCOUNTER — Ambulatory Visit
Admission: RE | Admit: 2019-03-05 | Discharge: 2019-03-05 | Disposition: A | Discharge status: Home / Self Care | Payer: Medicare HMO | Source: Ambulatory Visit | Attending: Radiation Oncology | Admitting: Radiation Oncology

## 2019-03-05 VITALS — BP 84/58 | HR 86 | Temp 97.8°F | Resp 20

## 2019-03-05 VITALS — BP 89/65 | HR 90

## 2019-03-05 DIAGNOSIS — D701 Agranulocytosis secondary to cancer chemotherapy: Secondary | ICD-10-CM | POA: Diagnosis not present

## 2019-03-05 DIAGNOSIS — Z79899 Other long term (current) drug therapy: Secondary | ICD-10-CM | POA: Insufficient documentation

## 2019-03-05 DIAGNOSIS — C3411 Malignant neoplasm of upper lobe, right bronchus or lung: Secondary | ICD-10-CM | POA: Diagnosis not present

## 2019-03-05 DIAGNOSIS — I10 Essential (primary) hypertension: Secondary | ICD-10-CM | POA: Insufficient documentation

## 2019-03-05 DIAGNOSIS — M8580 Other specified disorders of bone density and structure, unspecified site: Secondary | ICD-10-CM | POA: Insufficient documentation

## 2019-03-05 DIAGNOSIS — Z95828 Presence of other vascular implants and grafts: Secondary | ICD-10-CM

## 2019-03-05 DIAGNOSIS — Z5189 Encounter for other specified aftercare: Secondary | ICD-10-CM | POA: Insufficient documentation

## 2019-03-05 DIAGNOSIS — Z791 Long term (current) use of non-steroidal anti-inflammatories (NSAID): Secondary | ICD-10-CM | POA: Diagnosis not present

## 2019-03-05 DIAGNOSIS — J449 Chronic obstructive pulmonary disease, unspecified: Secondary | ICD-10-CM | POA: Insufficient documentation

## 2019-03-05 DIAGNOSIS — Z7982 Long term (current) use of aspirin: Secondary | ICD-10-CM | POA: Insufficient documentation

## 2019-03-05 DIAGNOSIS — Z510 Encounter for antineoplastic radiation therapy: Secondary | ICD-10-CM | POA: Insufficient documentation

## 2019-03-05 DIAGNOSIS — C3481 Malignant neoplasm of overlapping sites of right bronchus and lung: Secondary | ICD-10-CM

## 2019-03-05 DIAGNOSIS — E785 Hyperlipidemia, unspecified: Secondary | ICD-10-CM | POA: Diagnosis not present

## 2019-03-05 DIAGNOSIS — Z5111 Encounter for antineoplastic chemotherapy: Secondary | ICD-10-CM | POA: Diagnosis not present

## 2019-03-05 LAB — CMP (CANCER CENTER ONLY)
ALT: 9 U/L (ref 0–44)
AST: 15 U/L (ref 15–41)
Albumin: 3.4 g/dL — ABNORMAL LOW (ref 3.5–5.0)
Alkaline Phosphatase: 76 U/L (ref 38–126)
Anion gap: 10 (ref 5–15)
BUN: 9 mg/dL (ref 8–23)
CO2: 26 mmol/L (ref 22–32)
Calcium: 9.8 mg/dL (ref 8.9–10.3)
Chloride: 99 mmol/L (ref 98–111)
Creatinine: 0.87 mg/dL (ref 0.44–1.00)
GFR, Est AFR Am: >60 mL/min (ref >60)
GFR, Estimated: >60 mL/min (ref >60)
Glucose, Bld: 104 mg/dL — ABNORMAL HIGH (ref 70–99)
Potassium: 4.4 mmol/L (ref 3.5–5.1)
Sodium: 135 mmol/L (ref 135–145)
Total Bilirubin: <0.2 mg/dL — ABNORMAL LOW (ref 0.3–1.2)
Total Protein: 7.4 g/dL (ref 6.5–8.1)

## 2019-03-05 LAB — CBC WITH DIFFERENTIAL (CANCER CENTER ONLY)
Abs Immature Granulocytes: 0.18 10*3/uL — ABNORMAL HIGH (ref 0.00–0.07)
Basophils Absolute: 0 10*3/uL (ref 0.0–0.1)
Basophils Relative: 0 %
Eosinophils Absolute: 0 10*3/uL (ref 0.0–0.5)
Eosinophils Relative: 1 %
HCT: 34.6 % — ABNORMAL LOW (ref 36.0–46.0)
Hemoglobin: 11.7 g/dL — ABNORMAL LOW (ref 12.0–15.0)
Immature Granulocytes: 5 %
Lymphocytes Relative: 20 %
Lymphs Abs: 0.7 10*3/uL (ref 0.7–4.0)
MCH: 33.1 pg (ref 26.0–34.0)
MCHC: 33.8 g/dL (ref 30.0–36.0)
MCV: 98 fL (ref 80.0–100.0)
Monocytes Absolute: 0.5 10*3/uL (ref 0.1–1.0)
Monocytes Relative: 15 %
Neutro Abs: 2.2 10*3/uL (ref 1.7–7.7)
Neutrophils Relative %: 59 %
Platelet Count: 169 10*3/uL (ref 150–400)
RBC: 3.53 MIL/uL — ABNORMAL LOW (ref 3.87–5.11)
RDW: 12.2 % (ref 11.5–15.5)
WBC Count: 3.6 10*3/uL — ABNORMAL LOW (ref 4.0–10.5)
nRBC: 0 % (ref 0.0–0.2)

## 2019-03-05 MED ORDER — SODIUM CHLORIDE 0.9% FLUSH
10.0000 mL | Freq: Once | INTRAVENOUS | Status: AC
  Administered 2019-03-05: 10 mL
  Filled 2019-03-05: qty 10

## 2019-03-05 MED ORDER — OXYCODONE HCL 5 MG PO TABS: 5.0000 mg | ORAL_TABLET | ORAL | 0 refills | Status: AC | PRN

## 2019-03-05 MED ORDER — SODIUM CHLORIDE 0.9% FLUSH
10.0000 mL | INTRAVENOUS | Status: DC | PRN
  Administered 2019-03-05: 10 mL
  Filled 2019-03-05: qty 10

## 2019-03-05 MED ORDER — SODIUM CHLORIDE 0.9 % IV SOLN
Freq: Once | INTRAVENOUS | Status: AC
  Administered 2019-03-05: 12:00:00 via INTRAVENOUS
  Filled 2019-03-05: qty 5

## 2019-03-05 MED ORDER — PALONOSETRON HCL INJECTION 0.25 MG/5ML
INTRAVENOUS | Status: AC
  Filled 2019-03-05: qty 5

## 2019-03-05 MED ORDER — SODIUM CHLORIDE 0.9 % IV SOLN
INTRAVENOUS | Status: AC
  Administered 2019-03-05: 11:00:00 via INTRAVENOUS
  Filled 2019-03-05 (×2): qty 250

## 2019-03-05 MED ORDER — SODIUM CHLORIDE 0.9 % IV SOLN
329.0000 mg | Freq: Once | INTRAVENOUS | Status: AC
  Administered 2019-03-05: 13:00:00 330 mg via INTRAVENOUS
  Filled 2019-03-05: qty 33

## 2019-03-05 MED ORDER — SODIUM CHLORIDE 0.9 % IV SOLN
120.0000 mg/m2 | Freq: Once | INTRAVENOUS | Status: AC
  Administered 2019-03-05: 180 mg via INTRAVENOUS
  Filled 2019-03-05: qty 9

## 2019-03-05 MED ORDER — HEPARIN SOD (PORK) LOCK FLUSH 100 UNIT/ML IV SOLN
500.0000 [IU] | Freq: Once | INTRAVENOUS | Status: AC | PRN
  Administered 2019-03-05: 500 [IU]
  Filled 2019-03-05: qty 5

## 2019-03-05 MED ORDER — SODIUM CHLORIDE 0.9 % IV SOLN
Freq: Once | INTRAVENOUS | Status: AC
  Administered 2019-03-05: 11:00:00 via INTRAVENOUS
  Filled 2019-03-05: qty 250

## 2019-03-05 MED ORDER — PALONOSETRON HCL INJECTION 0.25 MG/5ML
0.2500 mg | Freq: Once | INTRAVENOUS | Status: AC
  Administered 2019-03-05: 0.25 mg via INTRAVENOUS

## 2019-03-05 NOTE — Progress Notes (Signed)
Per Dr. Julien Nordmann, ok to treat with BP today.  Pt to receive fluid bolus during treatment.   1322-BP recheck complete after patient has received 1 L fluids (89/65). Pt states " I feel fine".  Dr. Julien Nordmann notified, no new orders at this time.

## 2019-03-05 NOTE — Progress Notes (Signed)
[facility] [phone]   [phone]  OFFICE PROGRESS NOTE  [name], MD [address] [place] [address]  DIAGNOSIS: Limited stage (T3, N3, M0) small cell lung cancer presented with right upper lobe lung mass in addition to satellite nodule as well as right hilar, mediastinal and right supraclavicular lymphadenopathy diagnosed in September 2020.  PRIOR THERAPY: None.  CURRENT THERAPY: Concurrent chemoradiation with carboplatin for AUC of 5 on day 1 and 2 etoposide 120 mg/M2 on days 1, 2 and 3 every 3 weeks with neulasta support. First cycle on 02/12/2019. Status post 1 cycle.   INTERVAL HISTORY: Stacy [name] 73 y.o. female returns to the clinic today for follow-up visit.  The patient is feeling fine today with no concerning complaints except for mild fatigue and occasional pain on the right side of the chest.  She is requesting refill of her pain medication.  She denied having any current shortness of breath but has mild cough with no hemoptysis.  She denied having any fever or chills.  She has no nausea, vomiting, diarrhea or constipation.  She has no headache or visual changes.  She is here today for evaluation before starting cycle #2.  MEDICAL HISTORY: Past Medical History:  Diagnosis Date  . Carotid bruit    Bilateral  . Chest pain    occasionally, takes nitro if needed, at rest (1-2 times a month)  . Colon polyps    with divertirticulitis  . COPD (chronic obstructive pulmonary disease) ([place])   . DJD (degenerative joint disease)   . Emphysema lung ([place])   . Family history of adverse reaction to anesthesia    mom PONV  . Hyperlipidemia   . Hypertension   . Lung cancer ([place])   . Lymph node cancer ([place])   . Osteopenia   . Prediabetes   . Vitamin D deficiency     ALLERGIES:  is allergic to dilaudid [hydromorphone hcl]; fentanyl; penicillins; and crestor [rosuvastatin].  MEDICATIONS:  Current  Outpatient Medications  Medication Sig Dispense Refill  . albuterol (PROAIR HFA) 108 (90 Base) MCG/ACT inhaler Inhale 2 puffs into the lungs every 6 (six) hours as needed for wheezing or shortness of breath. 18 g 1  . ALPRAZolam (XANAX) 0.5 MG tablet TAKE 1/2 TO 1 (ONE-HALF TO ONE) TABLET BY MOUTH TWICE DAILY AS NEEDED FOR ANXIETY OR&nbsp;&nbsp;SLEEP 60 tablet 2  . aspirin 81 MG tablet Take 162 mg by mouth daily.     Marland Kitchen atenolol (TENORMIN) 25 MG tablet Take 1 tablet Daily for BP (Patient taking differently: Take 25 mg by mouth daily. ) 90 tablet 3  . cholecalciferol (VITAMIN D) 1000 units tablet Take 1,000 Units by mouth daily.     . diphenhydramine-acetaminophen (TYLENOL PM) 25-500 MG TABS tablet Take 1 tablet by mouth at bedtime as needed (sleep).     . ezetimibe (ZETIA) 10 MG tablet TAKE 1 TABLET BY MOUTH ONCE DAILY FOR CHOLESTEROL (Patient taking differently: Take 10 mg by mouth daily. ) 90 tablet 0  . ibuprofen (ADVIL) 200 MG tablet Take 600 mg by mouth every 6 (six) hours as needed for headache or mild pain.    . isosorbide mononitrate (IMDUR) 30 MG 24 hr tablet Take 30 mg by mouth daily.    Marland Kitchen lidocaine (XYLOCAINE) 2 % solution 5 ml swish and swallow or spit for mouth pain/pain with swallowing. May add to Magic Mouthwash 200 mL 1  . lidocaine-prilocaine (EMLA) cream Apply  1 application topically as needed. 30 g 0  . magic mouthwash SOLN 5 ml swish and spit QID prn mouth pain 240 mL 1  . nitroGLYCERIN (NITROSTAT) 0.4 MG SL tablet Place 1 tablet (0.4 mg total) under the tongue every 5 (five) minutes as needed for chest pain. (Patient not taking: Reported on 02/19/2019) 30 tablet 3  . oxyCODONE (OXY IR/ROXICODONE) 5 MG immediate release tablet Take 1 tablet (5 mg total) by mouth every 4 (four) hours as needed for moderate pain or breakthrough pain. (Patient not taking: Reported on 02/19/2019) 20 tablet 0  . prochlorperazine (COMPAZINE) 10 MG tablet Take 1 tablet (10 mg total) by mouth every 6 (six)  hours as needed for nausea or vomiting. (Patient not taking: Reported on 02/19/2019) 30 tablet 0   No current facility-administered medications for this visit.     SURGICAL HISTORY:  Past Surgical History:  Procedure Laterality Date  . CATARACT EXTRACTION Bilateral 03/17/2017  . COLONOSCOPY    . ESOPHAGOGASTRODUODENOSCOPY    . HAND SURGERY    . head injury  1995  . LAMINECTOMY  2002   L 5   . NECK SURGERY  2012    c4 -5/6  fushion  . PORTACATH PLACEMENT Left 02/19/2019   Procedure: INSERTION PORT-A-CATH;  Surgeon: Melrose Nakayama, MD;  Location: [facility];  Service: Thoracic;  Laterality: Left;  placed left subclavian  . TONSILLECTOMY    . TRIGGER FINGER RELEASE    . VIDEO BRONCHOSCOPY WITH ENDOBRONCHIAL ULTRASOUND N/A 01/25/2019   Procedure: VIDEO BRONCHOSCOPY WITH ENDOBRONCHIAL ULTRASOUND;  Surgeon: Melrose Nakayama, MD;  [facility];  Service: Thoracic;  Laterality: N/A;    REVIEW OF SYSTEMS:  A comprehensive review of systems was negative except for: Constitutional: positive for fatigue Respiratory: positive for pleurisy/chest pain Musculoskeletal: positive for muscle weakness   PHYSICAL EXAMINATION: General appearance: alert, cooperative, fatigued and no distress Head: Normocephalic, without obvious abnormality, atraumatic Neck: no adenopathy, no JVD, supple, symmetrical, trachea midline and thyroid not enlarged, symmetric, no tenderness/mass/nodules Lymph nodes: Cervical, supraclavicular, and axillary nodes normal. Resp: clear to auscultation bilaterally Back: symmetric, no curvature. ROM normal. No CVA tenderness. Cardio: regular rate and rhythm, S1, S2 normal, no murmur, click, rub or gallop GI: soft, non-tender; bowel sounds normal; no masses,  no organomegaly Extremities: extremities normal, atraumatic, no cyanosis or edema  ECOG PERFORMANCE STATUS: 1 - Symptomatic but completely ambulatory  Blood pressure (!) 84/58, pulse 86, temperature 97.8 F (36.6  C), temperature source Temporal, resp. rate 20, SpO2 98 %.  LABORATORY DATA: Lab Results  Component Value Date   WBC 3.6 (L) 03/05/2019   HGB 11.7 (L) 03/05/2019   HCT 34.6 (L) 03/05/2019   MCV 98.0 03/05/2019   PLT 169 03/05/2019      Chemistry      Component Value Date/Time   NA 135 02/26/2019 1528   K 4.3 02/26/2019 1528   CL 97 (L) 02/26/2019 1528   CO2 26 02/26/2019 1528   BUN 14 02/26/2019 1528   CREATININE 0.76 02/26/2019 1528   CREATININE 0.80 11/27/2018 1522      Component Value Date/Time   CALCIUM 9.5 02/26/2019 1528   ALKPHOS 78 02/26/2019 1528   AST 14 (L) 02/26/2019 1528   ALT 10 02/26/2019 1528   BILITOT 0.3 02/26/2019 1528       RADIOGRAPHIC STUDIES: Dg Chest 2 View  Result Date: 02/19/2019 CLINICAL DATA:  Preop for lobectomy. Lung cancer. EXAM: CHEST - 2 VIEW COMPARISON:  Radiograph  01/22/2019. CT 12/11/2018 FINDINGS: Subpleural opacity in the periphery of the right upper lobe corresponds to pulmonary mass on prior CT. Linear density extending to the right hilum with hilar prominence is also unchanged. Underlying emphysema. No pulmonary edema, new airspace disease, large pleural effusion or pneumothorax. Heart is normal in size. Aortic atherosclerosis. IMPRESSION: 1. Right upper lobe pulmonary mass and linear opacity extending to the hilum, unchanged from prior imaging. 2. Emphysema. 3. No acute abnormality. Electronically Signed   By: Keith Rake M.D.   On: 02/19/2019 06:48   Dg Fluoro Guide Cv Line-no Report  Result Date: 02/19/2019 Fluoroscopy was utilized by the requesting physician.  No radiographic interpretation.    ASSESSMENT AND PLAN: This is a very pleasant 73 years old white female recently diagnosed with limited stage small cell lung cancer and currently undergoing systemic chemotherapy with carboplatin and Doutova site concurrent with radiation status post 1 cycle.  She tolerated the first cycle of her treatment well with no concerning  symptoms except for fatigue. She had chemotherapy-induced neutropenia after the last cycle of her treatment.  The patient will have Udenyca injection after her treatment starting from the cycle. I recommended for her to proceed with cycle #2 today as planned. I will see her back for follow-up visit in 3 weeks for evaluation after repeating CT scan of the chest for restaging of her disease. The patient was advised to call immediately if she has any concerning symptoms in the interval. The patient voices understanding of current disease status and treatment options and is in agreement with the current care plan.  All questions were answered. The patient knows to call the clinic with any problems, questions or concerns. We can certainly see the patient much sooner if necessary.  I spent 10 minutes counseling the patient face to face. The total time spent in the appointment was 15 minutes.  Disclaimer: This note was dictated with voice recognition software. Similar sounding words can inadvertently be transcribed and may not be corrected upon review.

## 2019-03-05 NOTE — Patient Instructions (Signed)

## 2019-03-05 NOTE — Patient Instructions (Signed)
[facility] Discharge Instructions for Patients Receiving Chemotherapy  Today you received the following chemotherapy agents carboplatin and Etoposide  To help prevent nausea and vomiting after your treatment, we encourage you to take your nausea medication as directed.   If you develop nausea and vomiting that is not controlled by your nausea medication, call the clinic.   BELOW ARE SYMPTOMS THAT SHOULD BE REPORTED IMMEDIATELY:  *FEVER GREATER THAN 100.5 F  *CHILLS WITH OR WITHOUT FEVER  NAUSEA AND VOMITING THAT IS NOT CONTROLLED WITH YOUR NAUSEA MEDICATION  *UNUSUAL SHORTNESS OF BREATH  *UNUSUAL BRUISING OR BLEEDING  TENDERNESS IN MOUTH AND THROAT WITH OR WITHOUT PRESENCE OF ULCERS  *URINARY PROBLEMS  *BOWEL PROBLEMS  UNUSUAL RASH Items with * indicate a potential emergency and should be followed up as soon as possible.  Feel free to call the clinic should you have any questions or concerns. The clinic phone number is [phone].  Please show the [facility] at check-in to the Emergency Department and triage nurse.

## 2019-03-06 ENCOUNTER — Other Ambulatory Visit: Payer: Self-pay

## 2019-03-06 ENCOUNTER — Inpatient Hospital Stay: Payer: Medicare HMO

## 2019-03-06 ENCOUNTER — Ambulatory Visit
Admission: RE | Admit: 2019-03-06 | Discharge: 2019-03-06 | Disposition: A | Discharge status: Home / Self Care | Payer: Medicare HMO | Source: Ambulatory Visit | Attending: Radiation Oncology | Admitting: Radiation Oncology

## 2019-03-06 VITALS — BP 117/77 | HR 87 | Temp 98.7°F | Resp 20

## 2019-03-06 DIAGNOSIS — M8580 Other specified disorders of bone density and structure, unspecified site: Secondary | ICD-10-CM | POA: Diagnosis not present

## 2019-03-06 DIAGNOSIS — J449 Chronic obstructive pulmonary disease, unspecified: Secondary | ICD-10-CM | POA: Diagnosis not present

## 2019-03-06 DIAGNOSIS — D701 Agranulocytosis secondary to cancer chemotherapy: Secondary | ICD-10-CM | POA: Diagnosis not present

## 2019-03-06 DIAGNOSIS — Z510 Encounter for antineoplastic radiation therapy: Secondary | ICD-10-CM | POA: Diagnosis not present

## 2019-03-06 DIAGNOSIS — C3411 Malignant neoplasm of upper lobe, right bronchus or lung: Secondary | ICD-10-CM

## 2019-03-06 DIAGNOSIS — Z5111 Encounter for antineoplastic chemotherapy: Secondary | ICD-10-CM | POA: Diagnosis not present

## 2019-03-06 DIAGNOSIS — E785 Hyperlipidemia, unspecified: Secondary | ICD-10-CM | POA: Diagnosis not present

## 2019-03-06 DIAGNOSIS — Z791 Long term (current) use of non-steroidal anti-inflammatories (NSAID): Secondary | ICD-10-CM | POA: Diagnosis not present

## 2019-03-06 DIAGNOSIS — I10 Essential (primary) hypertension: Secondary | ICD-10-CM | POA: Diagnosis not present

## 2019-03-06 DIAGNOSIS — Z5189 Encounter for other specified aftercare: Secondary | ICD-10-CM | POA: Diagnosis not present

## 2019-03-06 DIAGNOSIS — Z7982 Long term (current) use of aspirin: Secondary | ICD-10-CM | POA: Diagnosis not present

## 2019-03-06 MED ORDER — DEXAMETHASONE SODIUM PHOSPHATE 10 MG/ML IJ SOLN
INTRAMUSCULAR | Status: AC
  Filled 2019-03-06: qty 1

## 2019-03-06 MED ORDER — HEPARIN SOD (PORK) LOCK FLUSH 100 UNIT/ML IV SOLN
500.0000 [IU] | Freq: Once | INTRAVENOUS | Status: AC | PRN
  Administered 2019-03-06: 500 [IU]
  Filled 2019-03-06: qty 5

## 2019-03-06 MED ORDER — SODIUM CHLORIDE 0.9 % IV SOLN
Freq: Once | INTRAVENOUS | Status: AC
  Administered 2019-03-06: 13:00:00 via INTRAVENOUS
  Filled 2019-03-06: qty 250

## 2019-03-06 MED ORDER — SODIUM CHLORIDE 0.9% FLUSH
10.0000 mL | INTRAVENOUS | Status: DC | PRN
  Administered 2019-03-06: 10 mL
  Filled 2019-03-06: qty 10

## 2019-03-06 MED ORDER — SODIUM CHLORIDE 0.9 % IV SOLN
120.0000 mg/m2 | Freq: Once | INTRAVENOUS | Status: AC
  Administered 2019-03-06: 180 mg via INTRAVENOUS
  Filled 2019-03-06: qty 9

## 2019-03-06 MED ORDER — DEXAMETHASONE SODIUM PHOSPHATE 10 MG/ML IJ SOLN
10.0000 mg | Freq: Once | INTRAMUSCULAR | Status: AC
  Administered 2019-03-06: 10 mg via INTRAVENOUS

## 2019-03-06 NOTE — Patient Instructions (Signed)
[facility] Discharge Instructions for Patients Receiving Chemotherapy  Today you received the following chemotherapy agents Etoposide  To help prevent nausea and vomiting after your treatment, we encourage you to take your nausea medication as directed.   If you develop nausea and vomiting that is not controlled by your nausea medication, call the clinic.   BELOW ARE SYMPTOMS THAT SHOULD BE REPORTED IMMEDIATELY:  *FEVER GREATER THAN 100.5 F  *CHILLS WITH OR WITHOUT FEVER  NAUSEA AND VOMITING THAT IS NOT CONTROLLED WITH YOUR NAUSEA MEDICATION  *UNUSUAL SHORTNESS OF BREATH  *UNUSUAL BRUISING OR BLEEDING  TENDERNESS IN MOUTH AND THROAT WITH OR WITHOUT PRESENCE OF ULCERS  *URINARY PROBLEMS  *BOWEL PROBLEMS  UNUSUAL RASH Items with * indicate a potential emergency and should be followed up as soon as possible.  Feel free to call the clinic should you have any questions or concerns. The clinic phone number is [phone].  Please show the [facility] at check-in to the Emergency Department and triage nurse.

## 2019-03-07 ENCOUNTER — Ambulatory Visit
Admission: RE | Admit: 2019-03-07 | Discharge: 2019-03-07 | Disposition: A | Discharge status: Home / Self Care | Payer: Medicare HMO | Source: Ambulatory Visit | Attending: Radiation Oncology | Admitting: Radiation Oncology

## 2019-03-07 ENCOUNTER — Other Ambulatory Visit: Payer: Self-pay

## 2019-03-07 ENCOUNTER — Inpatient Hospital Stay: Payer: Medicare HMO

## 2019-03-07 VITALS — BP 103/66 | HR 76 | Temp 98.5°F | Resp 18

## 2019-03-07 DIAGNOSIS — Z791 Long term (current) use of non-steroidal anti-inflammatories (NSAID): Secondary | ICD-10-CM | POA: Diagnosis not present

## 2019-03-07 DIAGNOSIS — I10 Essential (primary) hypertension: Secondary | ICD-10-CM | POA: Diagnosis not present

## 2019-03-07 DIAGNOSIS — Z5189 Encounter for other specified aftercare: Secondary | ICD-10-CM | POA: Diagnosis not present

## 2019-03-07 DIAGNOSIS — J449 Chronic obstructive pulmonary disease, unspecified: Secondary | ICD-10-CM | POA: Diagnosis not present

## 2019-03-07 DIAGNOSIS — E785 Hyperlipidemia, unspecified: Secondary | ICD-10-CM | POA: Diagnosis not present

## 2019-03-07 DIAGNOSIS — Z7982 Long term (current) use of aspirin: Secondary | ICD-10-CM | POA: Diagnosis not present

## 2019-03-07 DIAGNOSIS — C3411 Malignant neoplasm of upper lobe, right bronchus or lung: Secondary | ICD-10-CM

## 2019-03-07 DIAGNOSIS — Z510 Encounter for antineoplastic radiation therapy: Secondary | ICD-10-CM | POA: Diagnosis not present

## 2019-03-07 DIAGNOSIS — M8580 Other specified disorders of bone density and structure, unspecified site: Secondary | ICD-10-CM | POA: Diagnosis not present

## 2019-03-07 DIAGNOSIS — D701 Agranulocytosis secondary to cancer chemotherapy: Secondary | ICD-10-CM | POA: Diagnosis not present

## 2019-03-07 DIAGNOSIS — Z5111 Encounter for antineoplastic chemotherapy: Secondary | ICD-10-CM | POA: Diagnosis not present

## 2019-03-07 MED ORDER — DEXAMETHASONE SODIUM PHOSPHATE 10 MG/ML IJ SOLN
INTRAMUSCULAR | Status: AC
  Filled 2019-03-07: qty 1

## 2019-03-07 MED ORDER — SODIUM CHLORIDE 0.9 % IV SOLN
Freq: Once | INTRAVENOUS | Status: AC
  Administered 2019-03-07: 12:00:00 via INTRAVENOUS
  Filled 2019-03-07: qty 250

## 2019-03-07 MED ORDER — SODIUM CHLORIDE 0.9 % IV SOLN
120.0000 mg/m2 | Freq: Once | INTRAVENOUS | Status: AC
  Administered 2019-03-07: 13:00:00 180 mg via INTRAVENOUS
  Filled 2019-03-07: qty 9

## 2019-03-07 MED ORDER — DEXAMETHASONE SODIUM PHOSPHATE 10 MG/ML IJ SOLN
10.0000 mg | Freq: Once | INTRAMUSCULAR | Status: AC
  Administered 2019-03-07: 10 mg via INTRAVENOUS

## 2019-03-07 MED ORDER — HEPARIN SOD (PORK) LOCK FLUSH 100 UNIT/ML IV SOLN
500.0000 [IU] | Freq: Once | INTRAVENOUS | Status: AC | PRN
  Administered 2019-03-07: 500 [IU]
  Filled 2019-03-07: qty 5

## 2019-03-07 MED ORDER — SODIUM CHLORIDE 0.9% FLUSH
10.0000 mL | INTRAVENOUS | Status: DC | PRN
  Administered 2019-03-07: 10 mL
  Filled 2019-03-07: qty 10

## 2019-03-07 NOTE — Patient Instructions (Signed)
[facility] Discharge Instructions for Patients Receiving Chemotherapy  Today you received the following chemotherapy agents Etoposide  To help prevent nausea and vomiting after your treatment, we encourage you to take your nausea medication as directed.   If you develop nausea and vomiting that is not controlled by your nausea medication, call the clinic.   BELOW ARE SYMPTOMS THAT SHOULD BE REPORTED IMMEDIATELY:  *FEVER GREATER THAN 100.5 F  *CHILLS WITH OR WITHOUT FEVER  NAUSEA AND VOMITING THAT IS NOT CONTROLLED WITH YOUR NAUSEA MEDICATION  *UNUSUAL SHORTNESS OF BREATH  *UNUSUAL BRUISING OR BLEEDING  TENDERNESS IN MOUTH AND THROAT WITH OR WITHOUT PRESENCE OF ULCERS  *URINARY PROBLEMS  *BOWEL PROBLEMS  UNUSUAL RASH Items with * indicate a potential emergency and should be followed up as soon as possible.  Feel free to call the clinic should you have any questions or concerns. The clinic phone number is [phone].  Please show the [facility] at check-in to the Emergency Department and triage nurse.

## 2019-03-08 ENCOUNTER — Ambulatory Visit: Payer: Medicare HMO | Admitting: Cardiology

## 2019-03-08 ENCOUNTER — Ambulatory Visit
Admission: RE | Admit: 2019-03-08 | Discharge: 2019-03-08 | Disposition: A | Discharge status: Home / Self Care | Payer: Medicare HMO | Source: Ambulatory Visit | Attending: Radiation Oncology | Admitting: Radiation Oncology

## 2019-03-08 ENCOUNTER — Encounter: Payer: Self-pay | Admitting: Internal Medicine

## 2019-03-08 ENCOUNTER — Other Ambulatory Visit: Payer: Self-pay

## 2019-03-08 DIAGNOSIS — Z510 Encounter for antineoplastic radiation therapy: Secondary | ICD-10-CM | POA: Diagnosis not present

## 2019-03-08 DIAGNOSIS — C3411 Malignant neoplasm of upper lobe, right bronchus or lung: Secondary | ICD-10-CM | POA: Diagnosis not present

## 2019-03-09 ENCOUNTER — Other Ambulatory Visit: Payer: Self-pay

## 2019-03-09 ENCOUNTER — Ambulatory Visit
Admission: RE | Admit: 2019-03-09 | Discharge: 2019-03-09 | Disposition: A | Discharge status: Home / Self Care | Payer: Medicare HMO | Source: Ambulatory Visit | Attending: Radiation Oncology | Admitting: Radiation Oncology

## 2019-03-09 ENCOUNTER — Other Ambulatory Visit: Payer: Self-pay | Admitting: Radiation Oncology

## 2019-03-09 ENCOUNTER — Inpatient Hospital Stay: Payer: Medicare HMO

## 2019-03-09 DIAGNOSIS — Z510 Encounter for antineoplastic radiation therapy: Secondary | ICD-10-CM | POA: Diagnosis not present

## 2019-03-09 DIAGNOSIS — C3411 Malignant neoplasm of upper lobe, right bronchus or lung: Secondary | ICD-10-CM | POA: Diagnosis not present

## 2019-03-09 MED ORDER — SUCRALFATE 1 G PO TABS: 1.0000 g | ORAL_TABLET | Freq: Four times a day (QID) | ORAL | 2 refills | Status: AC

## 2019-03-12 ENCOUNTER — Inpatient Hospital Stay: Payer: Medicare HMO

## 2019-03-12 ENCOUNTER — Other Ambulatory Visit: Payer: Self-pay

## 2019-03-12 ENCOUNTER — Ambulatory Visit
Admission: RE | Admit: 2019-03-12 | Discharge: 2019-03-12 | Disposition: A | Discharge status: Home / Self Care | Payer: Medicare HMO | Source: Ambulatory Visit | Attending: Radiation Oncology | Admitting: Radiation Oncology

## 2019-03-12 DIAGNOSIS — M8580 Other specified disorders of bone density and structure, unspecified site: Secondary | ICD-10-CM | POA: Diagnosis not present

## 2019-03-12 DIAGNOSIS — I10 Essential (primary) hypertension: Secondary | ICD-10-CM | POA: Diagnosis not present

## 2019-03-12 DIAGNOSIS — C3481 Malignant neoplasm of overlapping sites of right bronchus and lung: Secondary | ICD-10-CM

## 2019-03-12 DIAGNOSIS — J449 Chronic obstructive pulmonary disease, unspecified: Secondary | ICD-10-CM | POA: Diagnosis not present

## 2019-03-12 DIAGNOSIS — Z5189 Encounter for other specified aftercare: Secondary | ICD-10-CM | POA: Diagnosis not present

## 2019-03-12 DIAGNOSIS — C3411 Malignant neoplasm of upper lobe, right bronchus or lung: Secondary | ICD-10-CM

## 2019-03-12 DIAGNOSIS — Z7982 Long term (current) use of aspirin: Secondary | ICD-10-CM | POA: Diagnosis not present

## 2019-03-12 DIAGNOSIS — Z95828 Presence of other vascular implants and grafts: Secondary | ICD-10-CM

## 2019-03-12 DIAGNOSIS — E785 Hyperlipidemia, unspecified: Secondary | ICD-10-CM | POA: Diagnosis not present

## 2019-03-12 DIAGNOSIS — Z791 Long term (current) use of non-steroidal anti-inflammatories (NSAID): Secondary | ICD-10-CM | POA: Diagnosis not present

## 2019-03-12 DIAGNOSIS — Z510 Encounter for antineoplastic radiation therapy: Secondary | ICD-10-CM | POA: Diagnosis not present

## 2019-03-12 DIAGNOSIS — Z5111 Encounter for antineoplastic chemotherapy: Secondary | ICD-10-CM | POA: Diagnosis not present

## 2019-03-12 DIAGNOSIS — D701 Agranulocytosis secondary to cancer chemotherapy: Secondary | ICD-10-CM | POA: Diagnosis not present

## 2019-03-12 LAB — CBC WITH DIFFERENTIAL (CANCER CENTER ONLY)
Abs Immature Granulocytes: 0.05 10*3/uL (ref 0.00–0.07)
Basophils Absolute: 0 10*3/uL (ref 0.0–0.1)
Basophils Relative: 0 %
Eosinophils Absolute: 0 10*3/uL (ref 0.0–0.5)
Eosinophils Relative: 0 %
HCT: 31.5 % — ABNORMAL LOW (ref 36.0–46.0)
Hemoglobin: 10.8 g/dL — ABNORMAL LOW (ref 12.0–15.0)
Immature Granulocytes: 2 %
Lymphocytes Relative: 11 %
Lymphs Abs: 0.3 10*3/uL — ABNORMAL LOW (ref 0.7–4.0)
MCH: 32.8 pg (ref 26.0–34.0)
MCHC: 34.3 g/dL (ref 30.0–36.0)
MCV: 95.7 fL (ref 80.0–100.0)
Monocytes Absolute: 0.1 10*3/uL (ref 0.1–1.0)
Monocytes Relative: 3 %
Neutro Abs: 1.9 10*3/uL (ref 1.7–7.7)
Neutrophils Relative %: 84 %
Platelet Count: 180 10*3/uL (ref 150–400)
RBC: 3.29 MIL/uL — ABNORMAL LOW (ref 3.87–5.11)
RDW: 12.2 % (ref 11.5–15.5)
WBC Count: 2.3 10*3/uL — ABNORMAL LOW (ref 4.0–10.5)
nRBC: 0 % (ref 0.0–0.2)

## 2019-03-12 LAB — CMP (CANCER CENTER ONLY)
ALT: 8 U/L (ref 0–44)
AST: 15 U/L (ref 15–41)
Albumin: 3.6 g/dL (ref 3.5–5.0)
Alkaline Phosphatase: 72 U/L (ref 38–126)
Anion gap: 11 (ref 5–15)
BUN: 15 mg/dL (ref 8–23)
CO2: 26 mmol/L (ref 22–32)
Calcium: 9.5 mg/dL (ref 8.9–10.3)
Chloride: 97 mmol/L — ABNORMAL LOW (ref 98–111)
Creatinine: 0.75 mg/dL (ref 0.44–1.00)
GFR, Est AFR Am: >60 mL/min (ref >60)
GFR, Estimated: >60 mL/min (ref >60)
Glucose, Bld: 117 mg/dL — ABNORMAL HIGH (ref 70–99)
Potassium: 4.5 mmol/L (ref 3.5–5.1)
Sodium: 134 mmol/L — ABNORMAL LOW (ref 135–145)
Total Bilirubin: 0.3 mg/dL (ref 0.3–1.2)
Total Protein: 7.7 g/dL (ref 6.5–8.1)

## 2019-03-12 MED ORDER — HEPARIN SOD (PORK) LOCK FLUSH 100 UNIT/ML IV SOLN
500.0000 [IU] | Freq: Once | INTRAVENOUS | Status: AC
  Administered 2019-03-12: 500 [IU] via INTRAVENOUS
  Filled 2019-03-12: qty 5

## 2019-03-12 MED ORDER — SODIUM CHLORIDE 0.9% FLUSH
10.0000 mL | Freq: Once | INTRAVENOUS | Status: AC
  Administered 2019-03-12: 10 mL via INTRAVENOUS
  Filled 2019-03-12: qty 10

## 2019-03-13 ENCOUNTER — Other Ambulatory Visit: Payer: Self-pay

## 2019-03-13 ENCOUNTER — Ambulatory Visit
Admission: RE | Admit: 2019-03-13 | Discharge: 2019-03-13 | Disposition: A | Discharge status: Home / Self Care | Payer: Medicare HMO | Source: Ambulatory Visit | Attending: Radiation Oncology | Admitting: Radiation Oncology

## 2019-03-13 DIAGNOSIS — Z510 Encounter for antineoplastic radiation therapy: Secondary | ICD-10-CM | POA: Diagnosis not present

## 2019-03-13 DIAGNOSIS — C3411 Malignant neoplasm of upper lobe, right bronchus or lung: Secondary | ICD-10-CM | POA: Diagnosis not present

## 2019-03-14 ENCOUNTER — Other Ambulatory Visit: Payer: Self-pay

## 2019-03-14 ENCOUNTER — Ambulatory Visit
Admission: RE | Admit: 2019-03-14 | Discharge: 2019-03-14 | Disposition: A | Discharge status: Home / Self Care | Payer: Medicare HMO | Source: Ambulatory Visit | Attending: Radiation Oncology | Admitting: Radiation Oncology

## 2019-03-14 DIAGNOSIS — C3411 Malignant neoplasm of upper lobe, right bronchus or lung: Secondary | ICD-10-CM | POA: Diagnosis not present

## 2019-03-14 DIAGNOSIS — Z510 Encounter for antineoplastic radiation therapy: Secondary | ICD-10-CM | POA: Diagnosis not present

## 2019-03-14 MED FILL — MAGIC MOUTHWASH N/B/M: 12 days supply | Qty: 240 | Fill #1

## 2019-03-15 ENCOUNTER — Ambulatory Visit
Admission: RE | Admit: 2019-03-15 | Discharge: 2019-03-15 | Disposition: A | Discharge status: Home / Self Care | Payer: Medicare HMO | Source: Ambulatory Visit | Attending: Radiation Oncology | Admitting: Radiation Oncology

## 2019-03-15 DIAGNOSIS — C3411 Malignant neoplasm of upper lobe, right bronchus or lung: Secondary | ICD-10-CM | POA: Diagnosis not present

## 2019-03-15 DIAGNOSIS — Z510 Encounter for antineoplastic radiation therapy: Secondary | ICD-10-CM | POA: Diagnosis not present

## 2019-03-16 ENCOUNTER — Other Ambulatory Visit: Payer: Self-pay

## 2019-03-16 ENCOUNTER — Ambulatory Visit: Payer: Medicare HMO | Admitting: Physical Medicine and Rehabilitation

## 2019-03-16 ENCOUNTER — Ambulatory Visit
Admission: RE | Admit: 2019-03-16 | Discharge: 2019-03-16 | Disposition: A | Discharge status: Home / Self Care | Payer: Medicare HMO | Source: Ambulatory Visit | Attending: Radiation Oncology | Admitting: Radiation Oncology

## 2019-03-16 ENCOUNTER — Encounter: Payer: Self-pay | Admitting: Physical Medicine and Rehabilitation

## 2019-03-16 DIAGNOSIS — Z510 Encounter for antineoplastic radiation therapy: Secondary | ICD-10-CM | POA: Diagnosis not present

## 2019-03-16 DIAGNOSIS — R531 Weakness: Secondary | ICD-10-CM

## 2019-03-16 DIAGNOSIS — R202 Paresthesia of skin: Secondary | ICD-10-CM | POA: Diagnosis not present

## 2019-03-16 DIAGNOSIS — C3411 Malignant neoplasm of upper lobe, right bronchus or lung: Secondary | ICD-10-CM | POA: Diagnosis not present

## 2019-03-16 NOTE — Progress Notes (Signed)
 .  Numeric Pain Rating Scale and Functional Assessment Average Pain 9   In the last MONTH (on 0-10 scale) has pain interfered with the following?  1. General activity like being  able to carry out your everyday physical activities such as walking, climbing stairs, carrying groceries, or moving a chair?  Rating(9)   +Driver, -BT, -Dye Allergies.

## 2019-03-18 ENCOUNTER — Other Ambulatory Visit: Payer: Self-pay | Admitting: Internal Medicine

## 2019-03-19 ENCOUNTER — Ambulatory Visit
Admission: RE | Admit: 2019-03-19 | Discharge: 2019-03-19 | Disposition: A | Discharge status: Home / Self Care | Payer: Medicare HMO | Source: Ambulatory Visit | Attending: Radiation Oncology | Admitting: Radiation Oncology

## 2019-03-19 ENCOUNTER — Telehealth: Payer: Self-pay | Admitting: *Deleted

## 2019-03-19 ENCOUNTER — Inpatient Hospital Stay: Payer: Medicare HMO

## 2019-03-19 ENCOUNTER — Other Ambulatory Visit: Payer: Self-pay | Admitting: Physician Assistant

## 2019-03-19 ENCOUNTER — Other Ambulatory Visit: Payer: Self-pay

## 2019-03-19 ENCOUNTER — Telehealth: Payer: Self-pay | Admitting: Internal Medicine

## 2019-03-19 DIAGNOSIS — D701 Agranulocytosis secondary to cancer chemotherapy: Secondary | ICD-10-CM | POA: Diagnosis not present

## 2019-03-19 DIAGNOSIS — E785 Hyperlipidemia, unspecified: Secondary | ICD-10-CM | POA: Diagnosis not present

## 2019-03-19 DIAGNOSIS — Z791 Long term (current) use of non-steroidal anti-inflammatories (NSAID): Secondary | ICD-10-CM | POA: Diagnosis not present

## 2019-03-19 DIAGNOSIS — M8580 Other specified disorders of bone density and structure, unspecified site: Secondary | ICD-10-CM | POA: Diagnosis not present

## 2019-03-19 DIAGNOSIS — Z7982 Long term (current) use of aspirin: Secondary | ICD-10-CM | POA: Diagnosis not present

## 2019-03-19 DIAGNOSIS — I10 Essential (primary) hypertension: Secondary | ICD-10-CM | POA: Diagnosis not present

## 2019-03-19 DIAGNOSIS — J449 Chronic obstructive pulmonary disease, unspecified: Secondary | ICD-10-CM | POA: Diagnosis not present

## 2019-03-19 DIAGNOSIS — Z510 Encounter for antineoplastic radiation therapy: Secondary | ICD-10-CM | POA: Diagnosis not present

## 2019-03-19 DIAGNOSIS — C3481 Malignant neoplasm of overlapping sites of right bronchus and lung: Secondary | ICD-10-CM

## 2019-03-19 DIAGNOSIS — Z5189 Encounter for other specified aftercare: Secondary | ICD-10-CM | POA: Diagnosis not present

## 2019-03-19 DIAGNOSIS — C3411 Malignant neoplasm of upper lobe, right bronchus or lung: Secondary | ICD-10-CM | POA: Diagnosis not present

## 2019-03-19 DIAGNOSIS — Z5111 Encounter for antineoplastic chemotherapy: Secondary | ICD-10-CM | POA: Diagnosis not present

## 2019-03-19 LAB — CMP (CANCER CENTER ONLY)
ALT: 13 U/L (ref 0–44)
AST: 16 U/L (ref 15–41)
Albumin: 3.6 g/dL (ref 3.5–5.0)
Alkaline Phosphatase: 75 U/L (ref 38–126)
Anion gap: 13 (ref 5–15)
BUN: 11 mg/dL (ref 8–23)
CO2: 27 mmol/L (ref 22–32)
Calcium: 9.4 mg/dL (ref 8.9–10.3)
Chloride: 94 mmol/L — ABNORMAL LOW (ref 98–111)
Creatinine: 0.72 mg/dL (ref 0.44–1.00)
GFR, Est AFR Am: >60 mL/min (ref >60)
GFR, Estimated: >60 mL/min (ref >60)
Glucose, Bld: 92 mg/dL (ref 70–99)
Potassium: 3.8 mmol/L (ref 3.5–5.1)
Sodium: 134 mmol/L — ABNORMAL LOW (ref 135–145)
Total Bilirubin: 0.3 mg/dL (ref 0.3–1.2)
Total Protein: 7.4 g/dL (ref 6.5–8.1)

## 2019-03-19 LAB — CBC WITH DIFFERENTIAL (CANCER CENTER ONLY)
Abs Immature Granulocytes: 0.01 10*3/uL (ref 0.00–0.07)
Basophils Absolute: 0 10*3/uL (ref 0.0–0.1)
Basophils Relative: 0 %
Eosinophils Absolute: 0 10*3/uL (ref 0.0–0.5)
Eosinophils Relative: 1 %
HCT: 26.6 % — ABNORMAL LOW (ref 36.0–46.0)
Hemoglobin: 9.6 g/dL — ABNORMAL LOW (ref 12.0–15.0)
Immature Granulocytes: 1 %
Lymphocytes Relative: 44 %
Lymphs Abs: 0.3 10*3/uL — ABNORMAL LOW (ref 0.7–4.0)
MCH: 32.9 pg (ref 26.0–34.0)
MCHC: 36.1 g/dL — ABNORMAL HIGH (ref 30.0–36.0)
MCV: 91.1 fL (ref 80.0–100.0)
Monocytes Absolute: 0.2 10*3/uL (ref 0.1–1.0)
Monocytes Relative: 25 %
Neutro Abs: 0.2 10*3/uL — CL (ref 1.7–7.7)
Neutrophils Relative %: 29 %
Platelet Count: 22 10*3/uL — ABNORMAL LOW (ref 150–400)
RBC: 2.92 MIL/uL — ABNORMAL LOW (ref 3.87–5.11)
RDW: 11.7 % (ref 11.5–15.5)
WBC Count: 0.8 10*3/uL — CL (ref 4.0–10.5)
nRBC: 0 % (ref 0.0–0.2)

## 2019-03-19 NOTE — Telephone Encounter (Signed)
Notified Cassie of WBC 0.8 Wants patient to receive Zarxio injections X 3 days beginning Tuesday.  Pt notified that she will receive call from scheduler to receive injection for 3 days starting tomorrow. Reviewed neutropenic precautions

## 2019-03-19 NOTE — Progress Notes (Signed)
Stacy [name] - 73 y.o. female MRN [id number]  Date of birth: May 20, 1945  Office Visit Note: Visit Date: 03/16/2019 PCP: [name], MD Referred by: [name], MD  Subjective: Chief Complaint  Patient presents with   Right Leg - Pain   Left Leg - Pain   HPI: Stacy [name] is a 73 y.o. female who comes in today At the request of Dr. Eunice Blase for electrodiagnostic study of both lower limbs.  She reports symptoms may have began around July but then she also talks about symptoms in September.  No specific injury.  No history of lumbar stenosis and no recent lumbar imaging.  She has had no prior electrodiagnostic studies.  Initial complaints were spasming in the thigh muscles with cramping and then she began noticing weakness when she walks.  She felt like if she walks with her knees straight she is okay but as soon as she bends her knee she gives a history of giveaway type weakness not progressive weakness.  She denies any numbness or tingling in the legs.  She has had no bowel or bladder dysfunction.  She had lab work done which was unremarkable.  According to the notes around August she had a CT scan performed of her lungs do to the fact that she had COPD and was still a current smoker.  The CT did reveal a lung mass which is now been diagnosed as small cell lung cancer.  She is undergoing chemotherapy and radiation treatment.  She notes that the symptoms in the legs began before the chemotherapy or radiation.  Symptoms began shortly before discovery of the mass however.     ROS Otherwise per HPI.  Assessment & Plan: Visit Diagnoses:  1. Paresthesia of skin   2. 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  Fairly definitively there is no specific nerve  entrapment of the lower extremities and nothing that appears to be radiculopathy.  There did not seem to be any evidence of a myopathy.  Clearly this could be some type of paraneoplastic issue from the cancer itself as she was having symptoms really just before it was discovered.  Because she was having symptoms prior to induction of chemotherapy and radiation this is likely not a source of her symptoms.  Radiation treatment would likely only affect the upper quadrant of the body.    Recommendations: 1.  Follow-up with referring physician.  Could consult neurology for further evaluation. 2.  Continue current management of symptoms.  Consider physical therapy with gait training and use of physical aids to reduce chance of falls.  Meds & Orders: No orders of the defined types were placed in this encounter.   Orders Placed This Encounter  Procedures   NCV with EMG (electromyography)    Follow-up: Return for Eunice Blase, MD.   Procedures: No procedures performed  EMG & NCV Findings: Evaluation of the left fibular motor and the left tibial motor nerves showed reduced amplitude (L1.4, L1.5 mV).  The right fibular motor nerve showed reduced amplitude (0.6 mV) and decreased conduction velocity (Poplt-B Fib, 29 m/s).  The right tibial motor nerve showed reduced amplitude (0.7 mV) and decreased conduction velocity (Knee-Ankle, 34 m/s).  The left saphenous sensory and the right saphenous sensory nerves showed prolonged distal peak latency (L5.1, R5.0  ms).  The left superficial fibular sensory nerve showed prolonged distal peak latency (4.9 ms) and decreased conduction velocity (14 cm-Ant Lat Mall, 29 m/s).  The right superficial fibular sensory nerve showed prolonged distal peak latency (5.1 ms), reduced amplitude (1.4 V), and decreased conduction velocity (14 cm-Ant Lat Mall, 27 m/s).  The left sural sensory and the right sural sensory nerves showed prolonged distal peak latency (L4.2, R4.8 ms) and  decreased conduction velocity (Calf-Lat Mall, L33, R29 m/s).  Left vs. Right side comparison data for the fibular motor nerve indicates abnormal L-R velocity difference (Poplt-B Fib, 24 m/s).  The tibial motor nerve indicates abnormal L-R amplitude difference (53.3 %).    All examined muscles (as indicated in the following table) showed no evidence of electrical instability.    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  Fairly definitively there is no specific nerve entrapment of the lower extremities and nothing that appears to be radiculopathy.  There did not seem to be any evidence of a myopathy.  Clearly this could be some type of paraneoplastic issue from the cancer itself as she was having symptoms really just before it was discovered.  Because she was having symptoms prior to induction of chemotherapy and radiation this is likely not a source of her symptoms.  Radiation treatment would likely only affect the upper quadrant of the body.    Recommendations: 1.  Follow-up with referring physician.  Could consult neurology for further evaluation. 2.  Continue current management of symptoms.  Consider physical therapy with gait training and use of physical aids to reduce chance of falls.  ___________________________ Laurence Spates FAAPMR Board Certified, American Board of Physical Medicine and Rehabilitation    Nerve Conduction Studies Anti Sensory Summary Table   Stim Site NR Peak (ms) Norm Peak (ms) P-T Amp (V) Norm P-T Amp Site1 Site2 Delta-P (ms) Dist (cm) Vel (m/s) Norm Vel (m/s)  Left Saphenous Anti Sensory (Ant Med Mall)  29.7C  14cm    *5.1 <4.4 9.9 >2 14cm Ant Med Mall 5.1 0.0  >32  Right Saphenous Anti Sensory (Ant Med Mall)  28.5C  14cm    *5.0 <4.4 10.0 >2 14cm Ant Med Mall 5.0 0.0  >32    Left Sup Fibular Anti Sensory (Ant Lat Mall)  29.7C  14 cm    *4.9 <4.4 9.9 >5.0 14 cm Ant Lat Mall 4.9 14.0 *29 >32  Right Sup Fibular Anti Sensory (Ant Lat Mall)  28.5C  14 cm    *5.1 <4.4 *1.4 >5.0 14 cm Ant Lat Mall 5.1 14.0 *27 >32  Left Sural Anti Sensory (Lat Mall)  29.4C  Calf    *4.2 <4.0 10.3 >5.0 Calf Lat Mall 4.2 14.0 *33 >35  Right Sural Anti Sensory (Lat Mall)  28.3C  Calf    *4.8 <4.0 6.5 >5.0 Calf Lat Mall 4.8 14.0 *29 >35   Motor Summary Table   Stim Site NR Onset (ms) Norm Onset (ms) O-P Amp (mV) Norm O-P Amp Site1 Site2 Delta-0 (ms) Dist (cm) Vel (m/s) Norm Vel (m/s)  Left Fibular Motor (Ext Dig Brev)  29C  Ankle    5.1 <6.1 *1.4 >2.5 B Fib Ankle 6.6 28.0 42 >38  B Fib    11.7  0.9  Poplt B Fib 1.9 10.0 53 >40  Poplt    13.6  1.0         Right Fibular Motor (Ext Dig Brev)  28.6C  Ankle    5.6 <6.1 *0.6 >2.5 B Fib Ankle 6.4 27.5 43 >38  B Fib    12.0  0.6  Poplt B Fib 3.4 10.0 *29 >40  Poplt    15.4  0.4         Left Tibial Motor (Abd Hall Brev)  29.8C  Ankle    5.9 <6.1 *1.5 >3.0 Knee Ankle 7.9 32.0 41 >35  Knee    13.8  0.5         Right Tibial Motor (Abd Hall Brev)  29C  Ankle    5.2 <6.1 *0.7 >3.0 Knee Ankle 9.4 32.0 *34 >35  Knee    14.6  0.3          EMG   Side Muscle Nerve Root Ins Act Fibs Psw Amp Dur Poly Recrt Int Fraser Din Comment  Left AntTibialis Dp Br Peron L4-5 Nml Nml Nml Nml Nml 0 Nml Nml   Left Fibularis Longus  Sup Br Peron L5-S1 Nml Nml Nml Nml Nml 0 Nml Nml   Left MedGastroc Tibial S1-2 Nml Nml Nml Nml Nml 0 Nml Nml   Left VastusMed Femoral L2-4 Nml Nml Nml Nml Nml 0 Nml Nml   Left BicepsFemS Sciatic L5-S1 Nml Nml Nml Nml Nml 0 Nml Nml   Right AntTibialis Dp Br Peron L4-5 Nml Nml Nml Nml Nml 0 Nml Nml   Right Fibularis Longus  Sup Br Peron L5-S1 Nml Nml Nml Nml Nml 0 Nml Nml   Right MedGastroc Tibial S1-2 Nml Nml Nml Nml Nml 0 Nml Nml   Right VastusMed Femoral L2-4 Nml Nml Nml Nml Nml 0 Nml Nml   Right BicepsFemS Sciatic L5-S1 Nml Nml  Nml Nml Nml 0 Nml Nml     Nerve Conduction Studies Anti Sensory Left/Right Comparison   Stim Site L Lat (ms) R Lat (ms) L-R Lat (ms) L Amp (V) R Amp (V) L-R Amp (%) Site1 Site2 L Vel (m/s) R Vel (m/s) L-R Vel (m/s)  Saphenous Anti Sensory (Ant Med Mall)  29.7C  14cm *5.1 *5.0 0.1 9.9 10.0 1.0 14cm Ant Med Mall     Sup Fibular Anti Sensory (Ant Lat Mall)  29.7C  14 cm *4.9 *5.1 0.2 9.9 *1.4 85.9 14 cm Ant Lat Mall *29 *27 2  Sural Anti Sensory (Lat Mall)  29.4C  Calf *4.2 *4.8 0.6 10.3 6.5 36.9 Calf Lat Mall *33 *29 4   Motor Left/Right Comparison   Stim Site L Lat (ms) R Lat (ms) L-R Lat (ms) L Amp (mV) R Amp (mV) L-R Amp (%) Site1 Site2 L Vel (m/s) R Vel (m/s) L-R Vel (m/s)  Fibular Motor (Ext Dig Brev)  29C  Ankle 5.1 5.6 0.5 *1.4 *0.6 57.1 B Fib Ankle 42 43 1  B Fib 11.7 12.0 0.3 0.9 0.6 33.3 Poplt B Fib 53 *29 *24  Poplt 13.6 15.4 1.8 1.0 0.4 60.0       Tibial Motor (Abd Hall Brev)  29.8C  Ankle 5.9 5.2 0.7 *1.5 *0.7 *53.3 Knee Ankle 41 *34 7  Knee 13.8 14.6 0.8 0.5 0.3 40.0          Waveforms:                     Clinical History: No specialty comments available.   She reports that she has been smoking. She has been smoking about 1.00  pack per day. She has never used smokeless tobacco.  Recent Labs    08/24/18 1407 11/27/18 1522  HGBA1C 5.6 5.7*    Objective:  VS:  HT:     WT:    BMI:      BP:    HR: bpm   TEMP: ( )   RESP:  Physical Exam Constitutional:      Appearance: She is ill-appearing.  Musculoskeletal:        General: No swelling, tenderness, deformity or signs of injury.     Right lower leg: No edema.     Left lower leg: No edema.     Comments: Inspection reveals generalized atrophy and appearance of weight loss with loose skin.  There is some atrophy of the EDB musculature bilaterally.  She has intact pulses.  She has intact sensation.  She has good strength with initial effort.  She gets very fatigued quickly.  She has no clonus  bilaterally.  Skin:    General: Skin is warm and dry.     Findings: No erythema or rash.  Neurological:     General: No focal deficit present.     Mental Status: She is alert and oriented to person, place, and time.     Motor: No weakness or abnormal muscle tone.     Coordination: Coordination normal.     Gait: Gait abnormal.  Psychiatric:        Mood and Affect: Mood normal.        Behavior: Behavior normal.     Ortho Exam Imaging: No results found.  Past Medical/Family/Surgical/Social History: Medications & Allergies reviewed per EMR, new medications updated. Patient Active Problem List   Diagnosis Date Noted   Chemotherapy induced neutropenia ([place]) 02/26/2019   Small cell lung cancer, overlapping sites of right lung ([place]) 01/29/2019   Encounter for antineoplastic chemotherapy 01/29/2019   Goals of care, counseling/discussion 01/29/2019   Exertional chest pain 01/23/2019   Peripheral artery disease ([place]) 01/23/2019   Malignant neoplasm of upper lobe of right lung ([place]) 01/11/2019   Insomnia 05/19/2018   Smokers' cough ([place]) 06/23/2017   Atherosclerosis of aorta ([place]) 06/23/2017   History of diverticulitis 06/23/2017   COPD (chronic obstructive pulmonary disease) ([place]) 04/02/2017   Interstitial lung disease ([place]) 04/02/2017   Tobacco abuse 03/23/2017   BMI 20.0-20.9, adult 02/17/2015   Poor compliance with medication 02/17/2015   Medication management 06/10/2014   Hypertension    Mixed hyperlipidemia    Vitamin D deficiency    Prediabetes    DJD (degenerative joint disease)    Past Medical History:  Diagnosis Date   Carotid bruit    Bilateral   Chest pain    occasionally, takes nitro if needed, at rest (1-2 times a month)   Colon polyps    with divertirticulitis   COPD (chronic obstructive pulmonary disease) ([facility])    DJD (degenerative joint disease)    Emphysema lung ([place])    Family history of adverse reaction to anesthesia     mom PONV   Hyperlipidemia    Hypertension    Lung cancer ([place])    Lymph node cancer ([place])    Osteopenia    Prediabetes    Vitamin D deficiency    Family History  Problem Relation Age of Onset   Hyperlipidemia Mother    Heart disease Mother        7 way bypass/ value replacement/pacemaker   Heart attack Mother 5   Diabetes Mother  Cancer - Other Mother        lung   Hypertension Father    Heart attack Father 12   Cancer - Other Father        throat   Past Surgical History:  Procedure Laterality Date   CATARACT EXTRACTION Bilateral 03/17/2017   COLONOSCOPY     ESOPHAGOGASTRODUODENOSCOPY     HAND SURGERY     head injury  1995   LAMINECTOMY  2002   L 5    NECK SURGERY  2012    c4 -5/6  fushion   PORTACATH PLACEMENT Left 02/19/2019   Procedure: INSERTION PORT-A-CATH;  Surgeon: Melrose Nakayama, MD;  Location: [facility];  Service: Thoracic;  Laterality: Left;  placed left subclavian   TONSILLECTOMY     TRIGGER FINGER RELEASE     VIDEO BRONCHOSCOPY WITH ENDOBRONCHIAL ULTRASOUND N/A 01/25/2019   Procedure: VIDEO BRONCHOSCOPY WITH ENDOBRONCHIAL ULTRASOUND;  Surgeon: Melrose Nakayama, MD;  Location: [facility];  Service: Thoracic;  Laterality: N/A;   Social History   Occupational History   Not on file  Tobacco Use   Smoking status: Current Every Day Smoker    Packs/day: 1.00   Smokeless tobacco: Never Used   Tobacco comment: smokes 2 cigarettes per day  Substance and Sexual Activity   Alcohol use: No   Drug use: Never   Sexual activity: Not on file

## 2019-03-19 NOTE — Telephone Encounter (Signed)
Left message re injection appointments added for 11/17 thru 11/19. Also added comment to xrt appointments to send patient to [facility] for injection after xrt.

## 2019-03-19 NOTE — Procedures (Signed)
EMG & NCV Findings: Evaluation of the left fibular motor and the left tibial motor nerves showed reduced amplitude (L1.4, L1.5 mV).  The right fibular motor nerve showed reduced amplitude (0.6 mV) and decreased conduction velocity (Poplt-B Fib, 29 m/s).  The right tibial motor nerve showed reduced amplitude (0.7 mV) and decreased conduction velocity (Knee-Ankle, 34 m/s).  The left saphenous sensory and the right saphenous sensory nerves showed prolonged distal peak latency (L5.1, R5.0 ms).  The left superficial fibular sensory nerve showed prolonged distal peak latency (4.9 ms) and decreased conduction velocity (14 cm-Ant Lat Mall, 29 m/s).  The right superficial fibular sensory nerve showed prolonged distal peak latency (5.1 ms), reduced amplitude (1.4 V), and decreased conduction velocity (14 cm-Ant Lat Mall, 27 m/s).  The left sural sensory and the right sural sensory nerves showed prolonged distal peak latency (L4.2, R4.8 ms) and decreased conduction velocity (Calf-Lat Mall, L33, R29 m/s).  Left vs. Right side comparison data for the fibular motor nerve indicates abnormal L-R velocity difference (Poplt-B Fib, 24 m/s).  The tibial motor nerve indicates abnormal L-R amplitude difference (53.3 %).    All examined muscles (as indicated in the following table) showed no evidence of electrical instability.    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  Fairly definitively there is no specific nerve entrapment of the lower extremities and nothing that appears to be radiculopathy.  There did not seem to be any evidence of a myopathy.  Clearly this could be some type of paraneoplastic issue from the cancer itself as she was having symptoms really just before it was discovered.  Because she was having symptoms prior to  induction of chemotherapy and radiation this is likely not a source of her symptoms.  Radiation treatment would likely only affect the upper quadrant of the body.    Recommendations: 1.  Follow-up with referring physician.  Could consult neurology for further evaluation. 2.  Continue current management of symptoms.  Consider physical therapy with gait training and use of physical aids to reduce chance of falls.  ___________________________ Laurence Spates FAAPMR Board Certified, American Board of Physical Medicine and Rehabilitation    Nerve Conduction Studies Anti Sensory Summary Table   Stim Site NR Peak (ms) Norm Peak (ms) P-T Amp (V) Norm P-T Amp Site1 Site2 Delta-P (ms) Dist (cm) Vel (m/s) Norm Vel (m/s)  Left Saphenous Anti Sensory (Ant Med Mall)  29.7C  14cm    *5.1 <4.4 9.9 >2 14cm Ant Med Mall 5.1 0.0  >32  Right Saphenous Anti Sensory (Ant Med Mall)  28.5C  14cm    *5.0 <4.4 10.0 >2 14cm Ant Med Mall 5.0 0.0  >32  Left Sup Fibular Anti Sensory (Ant Lat Mall)  29.7C  14 cm    *4.9 <4.4 9.9 >5.0 14 cm Ant Lat Mall 4.9 14.0 *29 >32  Right Sup Fibular Anti Sensory (Ant Lat Mall)  28.5C  14 cm    *5.1 <4.4 *1.4 >5.0 14 cm Ant Lat Mall 5.1 14.0 *27 >32  Left Sural Anti Sensory (Lat Mall)  29.4C  Calf    *4.2 <4.0 10.3 >5.0 Calf Lat Mall 4.2 14.0 *33 >35  Right Sural Anti Sensory (Lat Mall)  28.3C  Calf    *4.8 <4.0 6.5 >5.0 Calf Lat Mall 4.8 14.0 *29 >35   Motor Summary Table  Stim Site NR Onset (ms) Norm Onset (ms) O-P Amp (mV) Norm O-P Amp Site1 Site2 Delta-0 (ms) Dist (cm) Vel (m/s) Norm Vel (m/s)  Left Fibular Motor (Ext Dig Brev)  29C  Ankle    5.1 <6.1 *1.4 >2.5 B Fib Ankle 6.6 28.0 42 >38  B Fib    11.7  0.9  Poplt B Fib 1.9 10.0 53 >40  Poplt    13.6  1.0         Right Fibular Motor (Ext Dig Brev)  28.6C  Ankle    5.6 <6.1 *0.6 >2.5 B Fib Ankle 6.4 27.5 43 >38  B Fib    12.0  0.6  Poplt B Fib 3.4 10.0 *29 >40  Poplt    15.4  0.4         Left Tibial Motor  (Abd Hall Brev)  29.8C  Ankle    5.9 <6.1 *1.5 >3.0 Knee Ankle 7.9 32.0 41 >35  Knee    13.8  0.5         Right Tibial Motor (Abd Hall Brev)  29C  Ankle    5.2 <6.1 *0.7 >3.0 Knee Ankle 9.4 32.0 *34 >35  Knee    14.6  0.3          EMG   Side Muscle Nerve Root Ins Act Fibs Psw Amp Dur Poly Recrt Int Fraser Din Comment  Left AntTibialis Dp Br Peron L4-5 Nml Nml Nml Nml Nml 0 Nml Nml   Left Fibularis Longus  Sup Br Peron L5-S1 Nml Nml Nml Nml Nml 0 Nml Nml   Left MedGastroc Tibial S1-2 Nml Nml Nml Nml Nml 0 Nml Nml   Left VastusMed Femoral L2-4 Nml Nml Nml Nml Nml 0 Nml Nml   Left BicepsFemS Sciatic L5-S1 Nml Nml Nml Nml Nml 0 Nml Nml   Right AntTibialis Dp Br Peron L4-5 Nml Nml Nml Nml Nml 0 Nml Nml   Right Fibularis Longus  Sup Br Peron L5-S1 Nml Nml Nml Nml Nml 0 Nml Nml   Right MedGastroc Tibial S1-2 Nml Nml Nml Nml Nml 0 Nml Nml   Right VastusMed Femoral L2-4 Nml Nml Nml Nml Nml 0 Nml Nml   Right BicepsFemS Sciatic L5-S1 Nml Nml Nml Nml Nml 0 Nml Nml     Nerve Conduction Studies Anti Sensory Left/Right Comparison   Stim Site L Lat (ms) R Lat (ms) L-R Lat (ms) L Amp (V) R Amp (V) L-R Amp (%) Site1 Site2 L Vel (m/s) R Vel (m/s) L-R Vel (m/s)  Saphenous Anti Sensory (Ant Med Mall)  29.7C  14cm *5.1 *5.0 0.1 9.9 10.0 1.0 14cm Ant Med Mall     Sup Fibular Anti Sensory (Ant Lat Mall)  29.7C  14 cm *4.9 *5.1 0.2 9.9 *1.4 85.9 14 cm Ant Lat Mall *29 *27 2  Sural Anti Sensory (Lat Mall)  29.4C  Calf *4.2 *4.8 0.6 10.3 6.5 36.9 Calf Lat Mall *33 *29 4   Motor Left/Right Comparison   Stim Site L Lat (ms) R Lat (ms) L-R Lat (ms) L Amp (mV) R Amp (mV) L-R Amp (%) Site1 Site2 L Vel (m/s) R Vel (m/s) L-R Vel (m/s)  Fibular Motor (Ext Dig Brev)  29C  Ankle 5.1 5.6 0.5 *1.4 *0.6 57.1 B Fib Ankle 42 43 1  B Fib 11.7 12.0 0.3 0.9 0.6 33.3 Poplt B Fib 53 *29 *24  Poplt 13.6 15.4 1.8 1.0 0.4 60.0       Tibial Motor (Abd Hall Brev)  29.8C  Ankle 5.9 5.2 0.7 *1.5 *0.7 *53.3 Knee Ankle 41 *34 7   Knee 13.8 14.6 0.8 0.5 0.3 40.0          Waveforms:

## 2019-03-20 ENCOUNTER — Other Ambulatory Visit: Payer: Self-pay

## 2019-03-20 ENCOUNTER — Inpatient Hospital Stay: Payer: Medicare HMO

## 2019-03-20 ENCOUNTER — Ambulatory Visit
Admission: RE | Admit: 2019-03-20 | Discharge: 2019-03-20 | Disposition: A | Discharge status: Home / Self Care | Payer: Medicare HMO | Source: Ambulatory Visit | Attending: Radiation Oncology | Admitting: Radiation Oncology

## 2019-03-20 VITALS — BP 98/62 | HR 93 | Temp 98.3°F | Resp 17

## 2019-03-20 DIAGNOSIS — I10 Essential (primary) hypertension: Secondary | ICD-10-CM | POA: Diagnosis not present

## 2019-03-20 DIAGNOSIS — Z510 Encounter for antineoplastic radiation therapy: Secondary | ICD-10-CM | POA: Diagnosis not present

## 2019-03-20 DIAGNOSIS — Z791 Long term (current) use of non-steroidal anti-inflammatories (NSAID): Secondary | ICD-10-CM | POA: Diagnosis not present

## 2019-03-20 DIAGNOSIS — Z7982 Long term (current) use of aspirin: Secondary | ICD-10-CM | POA: Diagnosis not present

## 2019-03-20 DIAGNOSIS — C3411 Malignant neoplasm of upper lobe, right bronchus or lung: Secondary | ICD-10-CM | POA: Diagnosis not present

## 2019-03-20 DIAGNOSIS — M8580 Other specified disorders of bone density and structure, unspecified site: Secondary | ICD-10-CM | POA: Diagnosis not present

## 2019-03-20 DIAGNOSIS — D701 Agranulocytosis secondary to cancer chemotherapy: Secondary | ICD-10-CM

## 2019-03-20 DIAGNOSIS — Z5189 Encounter for other specified aftercare: Secondary | ICD-10-CM | POA: Diagnosis not present

## 2019-03-20 DIAGNOSIS — Z5111 Encounter for antineoplastic chemotherapy: Secondary | ICD-10-CM | POA: Diagnosis not present

## 2019-03-20 DIAGNOSIS — J449 Chronic obstructive pulmonary disease, unspecified: Secondary | ICD-10-CM | POA: Diagnosis not present

## 2019-03-20 DIAGNOSIS — E785 Hyperlipidemia, unspecified: Secondary | ICD-10-CM | POA: Diagnosis not present

## 2019-03-20 MED ORDER — FILGRASTIM-SNDZ 300 MCG/0.5ML IJ SOSY
PREFILLED_SYRINGE | INTRAMUSCULAR | Status: AC
  Filled 2019-03-20: qty 0.5

## 2019-03-20 MED ORDER — FILGRASTIM-SNDZ 300 MCG/0.5ML IJ SOSY
300.0000 ug | PREFILLED_SYRINGE | Freq: Once | INTRAMUSCULAR | Status: AC
  Administered 2019-03-20: 15:00:00 300 ug via SUBCUTANEOUS

## 2019-03-20 NOTE — Patient Instructions (Signed)

## 2019-03-21 ENCOUNTER — Inpatient Hospital Stay: Payer: Medicare HMO

## 2019-03-21 ENCOUNTER — Ambulatory Visit
Admission: RE | Admit: 2019-03-21 | Discharge: 2019-03-21 | Disposition: A | Discharge status: Home / Self Care | Payer: Medicare HMO | Source: Ambulatory Visit | Attending: Radiation Oncology | Admitting: Radiation Oncology

## 2019-03-21 ENCOUNTER — Other Ambulatory Visit: Payer: Self-pay

## 2019-03-21 VITALS — BP 108/72 | HR 88 | Temp 98.7°F | Resp 18

## 2019-03-21 DIAGNOSIS — Z510 Encounter for antineoplastic radiation therapy: Secondary | ICD-10-CM | POA: Diagnosis not present

## 2019-03-21 DIAGNOSIS — E785 Hyperlipidemia, unspecified: Secondary | ICD-10-CM | POA: Diagnosis not present

## 2019-03-21 DIAGNOSIS — D701 Agranulocytosis secondary to cancer chemotherapy: Secondary | ICD-10-CM

## 2019-03-21 DIAGNOSIS — M8580 Other specified disorders of bone density and structure, unspecified site: Secondary | ICD-10-CM | POA: Diagnosis not present

## 2019-03-21 DIAGNOSIS — I10 Essential (primary) hypertension: Secondary | ICD-10-CM | POA: Diagnosis not present

## 2019-03-21 DIAGNOSIS — Z5189 Encounter for other specified aftercare: Secondary | ICD-10-CM | POA: Diagnosis not present

## 2019-03-21 DIAGNOSIS — Z791 Long term (current) use of non-steroidal anti-inflammatories (NSAID): Secondary | ICD-10-CM | POA: Diagnosis not present

## 2019-03-21 DIAGNOSIS — C3411 Malignant neoplasm of upper lobe, right bronchus or lung: Secondary | ICD-10-CM | POA: Diagnosis not present

## 2019-03-21 DIAGNOSIS — Z5111 Encounter for antineoplastic chemotherapy: Secondary | ICD-10-CM | POA: Diagnosis not present

## 2019-03-21 DIAGNOSIS — Z7982 Long term (current) use of aspirin: Secondary | ICD-10-CM | POA: Diagnosis not present

## 2019-03-21 DIAGNOSIS — J449 Chronic obstructive pulmonary disease, unspecified: Secondary | ICD-10-CM | POA: Diagnosis not present

## 2019-03-21 MED ORDER — FILGRASTIM-SNDZ 300 MCG/0.5ML IJ SOSY
300.0000 ug | PREFILLED_SYRINGE | Freq: Once | INTRAMUSCULAR | Status: AC
  Administered 2019-03-21: 300 ug via SUBCUTANEOUS

## 2019-03-21 MED ORDER — FILGRASTIM-SNDZ 300 MCG/0.5ML IJ SOSY
PREFILLED_SYRINGE | INTRAMUSCULAR | Status: AC
  Filled 2019-03-21: qty 0.5

## 2019-03-21 NOTE — Patient Instructions (Signed)

## 2019-03-22 ENCOUNTER — Inpatient Hospital Stay: Payer: Medicare HMO

## 2019-03-22 ENCOUNTER — Other Ambulatory Visit: Payer: Self-pay

## 2019-03-22 ENCOUNTER — Ambulatory Visit
Admission: RE | Admit: 2019-03-22 | Discharge: 2019-03-22 | Disposition: A | Discharge status: Home / Self Care | Payer: Medicare HMO | Source: Ambulatory Visit | Attending: Radiation Oncology | Admitting: Radiation Oncology

## 2019-03-22 VITALS — BP 118/72 | HR 78 | Temp 98.7°F | Resp 18

## 2019-03-22 DIAGNOSIS — E785 Hyperlipidemia, unspecified: Secondary | ICD-10-CM | POA: Diagnosis not present

## 2019-03-22 DIAGNOSIS — D701 Agranulocytosis secondary to cancer chemotherapy: Secondary | ICD-10-CM | POA: Diagnosis not present

## 2019-03-22 DIAGNOSIS — Z5189 Encounter for other specified aftercare: Secondary | ICD-10-CM | POA: Diagnosis not present

## 2019-03-22 DIAGNOSIS — J449 Chronic obstructive pulmonary disease, unspecified: Secondary | ICD-10-CM | POA: Diagnosis not present

## 2019-03-22 DIAGNOSIS — Z7982 Long term (current) use of aspirin: Secondary | ICD-10-CM | POA: Diagnosis not present

## 2019-03-22 DIAGNOSIS — Z791 Long term (current) use of non-steroidal anti-inflammatories (NSAID): Secondary | ICD-10-CM | POA: Diagnosis not present

## 2019-03-22 DIAGNOSIS — Z5111 Encounter for antineoplastic chemotherapy: Secondary | ICD-10-CM | POA: Diagnosis not present

## 2019-03-22 DIAGNOSIS — Z510 Encounter for antineoplastic radiation therapy: Secondary | ICD-10-CM | POA: Diagnosis not present

## 2019-03-22 DIAGNOSIS — M8580 Other specified disorders of bone density and structure, unspecified site: Secondary | ICD-10-CM | POA: Diagnosis not present

## 2019-03-22 DIAGNOSIS — I10 Essential (primary) hypertension: Secondary | ICD-10-CM | POA: Diagnosis not present

## 2019-03-22 DIAGNOSIS — C3411 Malignant neoplasm of upper lobe, right bronchus or lung: Secondary | ICD-10-CM | POA: Diagnosis not present

## 2019-03-22 MED ORDER — FILGRASTIM-SNDZ 300 MCG/0.5ML IJ SOSY
300.0000 ug | PREFILLED_SYRINGE | Freq: Once | INTRAMUSCULAR | Status: AC
  Administered 2019-03-22: 300 ug via SUBCUTANEOUS

## 2019-03-23 ENCOUNTER — Other Ambulatory Visit: Payer: Self-pay | Admitting: Radiation Oncology

## 2019-03-23 ENCOUNTER — Other Ambulatory Visit: Payer: Self-pay

## 2019-03-23 ENCOUNTER — Telehealth: Payer: Self-pay | Admitting: Physician Assistant

## 2019-03-23 ENCOUNTER — Ambulatory Visit
Admission: RE | Admit: 2019-03-23 | Discharge: 2019-03-23 | Disposition: A | Discharge status: Home / Self Care | Payer: Medicare HMO | Source: Ambulatory Visit | Attending: Radiation Oncology | Admitting: Radiation Oncology

## 2019-03-23 DIAGNOSIS — C3411 Malignant neoplasm of upper lobe, right bronchus or lung: Secondary | ICD-10-CM | POA: Diagnosis not present

## 2019-03-23 DIAGNOSIS — Z510 Encounter for antineoplastic radiation therapy: Secondary | ICD-10-CM | POA: Diagnosis not present

## 2019-03-23 MED ORDER — ONDANSETRON HCL 8 MG PO TABS: 8.0000 mg | ORAL_TABLET | Freq: Two times a day (BID) | ORAL | 1 refills | Status: AC | PRN

## 2019-03-23 NOTE — Telephone Encounter (Signed)
Noticed the patient's CT scan was not scheduled. Spoke to the patient and gave her the number to radiology scheduling. Instructed her to call and schedule her CT scan at her earliest availability

## 2019-03-25 ENCOUNTER — Ambulatory Visit
Admission: RE | Admit: 2019-03-25 | Discharge: 2019-03-25 | Disposition: A | Discharge status: Home / Self Care | Payer: Medicare HMO | Source: Ambulatory Visit | Attending: Radiation Oncology | Admitting: Radiation Oncology

## 2019-03-25 ENCOUNTER — Other Ambulatory Visit: Payer: Self-pay

## 2019-03-25 DIAGNOSIS — C3411 Malignant neoplasm of upper lobe, right bronchus or lung: Secondary | ICD-10-CM | POA: Diagnosis not present

## 2019-03-25 DIAGNOSIS — Z510 Encounter for antineoplastic radiation therapy: Secondary | ICD-10-CM | POA: Diagnosis not present

## 2019-03-26 ENCOUNTER — Encounter: Payer: Self-pay | Admitting: Physician Assistant

## 2019-03-26 ENCOUNTER — Ambulatory Visit: Payer: Medicare HMO

## 2019-03-26 ENCOUNTER — Inpatient Hospital Stay: Payer: Medicare HMO

## 2019-03-26 ENCOUNTER — Inpatient Hospital Stay (HOSPITAL_BASED_OUTPATIENT_CLINIC_OR_DEPARTMENT_OTHER): Payer: Medicare HMO | Admitting: Physician Assistant

## 2019-03-26 ENCOUNTER — Other Ambulatory Visit: Payer: Self-pay

## 2019-03-26 ENCOUNTER — Ambulatory Visit
Admission: RE | Admit: 2019-03-26 | Discharge: 2019-03-26 | Disposition: A | Discharge status: Home / Self Care | Payer: Medicare HMO | Source: Ambulatory Visit | Attending: Radiation Oncology | Admitting: Radiation Oncology

## 2019-03-26 VITALS — BP 94/66 | HR 94 | Temp 98.5°F | Resp 18 | Ht 62.0 in | Wt 98.9 lb

## 2019-03-26 DIAGNOSIS — C3481 Malignant neoplasm of overlapping sites of right bronchus and lung: Secondary | ICD-10-CM

## 2019-03-26 DIAGNOSIS — I10 Essential (primary) hypertension: Secondary | ICD-10-CM | POA: Diagnosis not present

## 2019-03-26 DIAGNOSIS — J449 Chronic obstructive pulmonary disease, unspecified: Secondary | ICD-10-CM | POA: Diagnosis not present

## 2019-03-26 DIAGNOSIS — Z7982 Long term (current) use of aspirin: Secondary | ICD-10-CM | POA: Diagnosis not present

## 2019-03-26 DIAGNOSIS — R634 Abnormal weight loss: Secondary | ICD-10-CM | POA: Diagnosis not present

## 2019-03-26 DIAGNOSIS — Z5189 Encounter for other specified aftercare: Secondary | ICD-10-CM | POA: Diagnosis not present

## 2019-03-26 DIAGNOSIS — E785 Hyperlipidemia, unspecified: Secondary | ICD-10-CM | POA: Diagnosis not present

## 2019-03-26 DIAGNOSIS — Z791 Long term (current) use of non-steroidal anti-inflammatories (NSAID): Secondary | ICD-10-CM | POA: Diagnosis not present

## 2019-03-26 DIAGNOSIS — R110 Nausea: Secondary | ICD-10-CM | POA: Diagnosis not present

## 2019-03-26 DIAGNOSIS — Z5111 Encounter for antineoplastic chemotherapy: Secondary | ICD-10-CM | POA: Diagnosis not present

## 2019-03-26 DIAGNOSIS — C3411 Malignant neoplasm of upper lobe, right bronchus or lung: Secondary | ICD-10-CM

## 2019-03-26 DIAGNOSIS — M8580 Other specified disorders of bone density and structure, unspecified site: Secondary | ICD-10-CM | POA: Diagnosis not present

## 2019-03-26 DIAGNOSIS — Z510 Encounter for antineoplastic radiation therapy: Secondary | ICD-10-CM | POA: Diagnosis not present

## 2019-03-26 DIAGNOSIS — D701 Agranulocytosis secondary to cancer chemotherapy: Secondary | ICD-10-CM | POA: Diagnosis not present

## 2019-03-26 LAB — CBC WITH DIFFERENTIAL (CANCER CENTER ONLY)
Abs Immature Granulocytes: 0.66 10*3/uL — ABNORMAL HIGH (ref 0.00–0.07)
Basophils Absolute: 0 10*3/uL (ref 0.0–0.1)
Basophils Relative: 0 %
Eosinophils Absolute: 0 10*3/uL (ref 0.0–0.5)
Eosinophils Relative: 0 %
HCT: 27.4 % — ABNORMAL LOW (ref 36.0–46.0)
Hemoglobin: 9.6 g/dL — ABNORMAL LOW (ref 12.0–15.0)
Immature Granulocytes: 9 %
Lymphocytes Relative: 6 %
Lymphs Abs: 0.4 10*3/uL — ABNORMAL LOW (ref 0.7–4.0)
MCH: 33.2 pg (ref 26.0–34.0)
MCHC: 35 g/dL (ref 30.0–36.0)
MCV: 94.8 fL (ref 80.0–100.0)
Monocytes Absolute: 1.1 10*3/uL — ABNORMAL HIGH (ref 0.1–1.0)
Monocytes Relative: 14 %
Neutro Abs: 5.3 10*3/uL (ref 1.7–7.7)
Neutrophils Relative %: 71 %
Platelet Count: 96 10*3/uL — ABNORMAL LOW (ref 150–400)
RBC: 2.89 MIL/uL — ABNORMAL LOW (ref 3.87–5.11)
RDW: 12.9 % (ref 11.5–15.5)
WBC Count: 7.5 10*3/uL (ref 4.0–10.5)
nRBC: 0 % (ref 0.0–0.2)

## 2019-03-26 LAB — CMP (CANCER CENTER ONLY)
ALT: 8 U/L (ref 0–44)
AST: 16 U/L (ref 15–41)
Albumin: 3.5 g/dL (ref 3.5–5.0)
Alkaline Phosphatase: 85 U/L (ref 38–126)
Anion gap: 15 (ref 5–15)
BUN: 9 mg/dL (ref 8–23)
CO2: 26 mmol/L (ref 22–32)
Calcium: 9.4 mg/dL (ref 8.9–10.3)
Chloride: 94 mmol/L — ABNORMAL LOW (ref 98–111)
Creatinine: 0.81 mg/dL (ref 0.44–1.00)
GFR, Est AFR Am: >60 mL/min (ref >60)
GFR, Estimated: >60 mL/min (ref >60)
Glucose, Bld: 101 mg/dL — ABNORMAL HIGH (ref 70–99)
Potassium: 3.6 mmol/L (ref 3.5–5.1)
Sodium: 135 mmol/L (ref 135–145)
Total Bilirubin: <0.2 mg/dL — ABNORMAL LOW (ref 0.3–1.2)
Total Protein: 7.2 g/dL (ref 6.5–8.1)

## 2019-03-26 MED ORDER — HEPARIN SOD (PORK) LOCK FLUSH 100 UNIT/ML IV SOLN
500.0000 [IU] | Freq: Once | INTRAVENOUS | Status: AC | PRN
  Administered 2019-03-26: 500 [IU]
  Filled 2019-03-26: qty 5

## 2019-03-26 MED ORDER — DENOSUMAB 120 MG/1.7ML ~~LOC~~ SOLN
SUBCUTANEOUS | Status: AC
  Filled 2019-03-26: qty 1.7

## 2019-03-26 MED ORDER — SODIUM CHLORIDE 0.9 % IV SOLN
120.0000 mg/m2 | Freq: Once | INTRAVENOUS | Status: AC
  Administered 2019-03-26: 180 mg via INTRAVENOUS
  Filled 2019-03-26: qty 9

## 2019-03-26 MED ORDER — PALONOSETRON HCL INJECTION 0.25 MG/5ML
INTRAVENOUS | Status: AC
  Filled 2019-03-26: qty 5

## 2019-03-26 MED ORDER — SODIUM CHLORIDE 0.9 % IV SOLN
Freq: Once | INTRAVENOUS | Status: AC
  Administered 2019-03-26: 11:00:00 via INTRAVENOUS
  Filled 2019-03-26: qty 250

## 2019-03-26 MED ORDER — DEXAMETHASONE 4 MG PO TABS: 4.0000 mg | ORAL_TABLET | Freq: Two times a day (BID) | ORAL | 0 refills | Status: AC

## 2019-03-26 MED ORDER — PALONOSETRON HCL INJECTION 0.25 MG/5ML
0.2500 mg | Freq: Once | INTRAVENOUS | Status: AC
  Administered 2019-03-26: 11:00:00 0.25 mg via INTRAVENOUS

## 2019-03-26 MED ORDER — SODIUM CHLORIDE 0.9 % IV SOLN
Freq: Once | INTRAVENOUS | Status: AC
  Administered 2019-03-26: 12:00:00 via INTRAVENOUS
  Filled 2019-03-26: qty 5

## 2019-03-26 MED ORDER — SODIUM CHLORIDE 0.9 % IV SOLN
329.0000 mg | Freq: Once | INTRAVENOUS | Status: AC
  Administered 2019-03-26: 13:00:00 330 mg via INTRAVENOUS
  Filled 2019-03-26: qty 33

## 2019-03-26 MED ORDER — SODIUM CHLORIDE 0.9% FLUSH
10.0000 mL | INTRAVENOUS | Status: DC | PRN
  Administered 2019-03-26: 15:00:00 10 mL
  Filled 2019-03-26: qty 10

## 2019-03-26 NOTE — Progress Notes (Signed)
03/26/2019 Ok to treat Carbo/Etoposide with Platelets 96 per [employer], PA

## 2019-03-26 NOTE — Patient Instructions (Signed)
[facility] Discharge Instructions for Patients Receiving Chemotherapy  Today you received the following chemotherapy agents carboplatin and Etoposide  To help prevent nausea and vomiting after your treatment, we encourage you to take your nausea medication as directed.   If you develop nausea and vomiting that is not controlled by your nausea medication, call the clinic.   BELOW ARE SYMPTOMS THAT SHOULD BE REPORTED IMMEDIATELY:  *FEVER GREATER THAN 100.5 F  *CHILLS WITH OR WITHOUT FEVER  NAUSEA AND VOMITING THAT IS NOT CONTROLLED WITH YOUR NAUSEA MEDICATION  *UNUSUAL SHORTNESS OF BREATH  *UNUSUAL BRUISING OR BLEEDING  TENDERNESS IN MOUTH AND THROAT WITH OR WITHOUT PRESENCE OF ULCERS  *URINARY PROBLEMS  *BOWEL PROBLEMS  UNUSUAL RASH Items with * indicate a potential emergency and should be followed up as soon as possible.  Feel free to call the clinic should you have any questions or concerns. The clinic phone number is [phone].  Please show the [facility] at check-in to the Emergency Department and triage nurse.

## 2019-03-26 NOTE — Progress Notes (Signed)
Per C. Hellingoepter PA, ok to treat with platelets of 96 today.

## 2019-03-26 NOTE — Progress Notes (Signed)
[facility] OFFICE PROGRESS NOTE  [name], [address] [place] [address]  DIAGNOSIS: Limited stage (T3, N3, M0) small cell lung cancer presented with right upper lobe lung mass in addition to satellite nodule as well as right hilar, mediastinal and right supraclavicular lymphadenopathy diagnosed in September 2020.  PRIOR THERAPY: None  CURRENT THERAPY: Concurrent chemoradiation with carboplatin for AUC of 5 on day 1 and 2 etoposide 120 mg/M2 on days 1, 2 and 3 every 3 weeks with neulasta support. First cycle on 02/12/2019. Status post 2 cycles.   INTERVAL HISTORY: Stacy [name] 73 y.o. female returns to the clinic for a follow-up visit.  The patient is feeling fair today without any concerning complaints except for persistent nausea. She is prescribed Zofran and compazine for nausea without significant relief. She denies significant vomiting. She states she had one episode of vomiting this morning. Her nausea is affecting her appetite and she has a 12 lb weight loss over the last 4 weeks. She is trying to drink 3-4 boost per day. She is drinking plenty of water and states that it is one of the few things she tolerates.   With her last cycle of treatment, she missed her Udenyca injection following cycle #2 and was found to have neutropenia on routine weekly labs which required 3 Zarxio injections.   She also had mucositis and was given a prescription for viscous lidocaine and Magic mouthwash which resolved her symptoms.  She denies any fever, chills, or night sweats.  She denies any diarrhea or constipation.  Her shortness of breath is somewhat better and she reports her similar baseline cough. She notes improvement in her prior right sided chest pain. She denies hemoptysis.  She denies any headache. She reports blurry vision in both eyes. She recently had her staging brain MRI which was negative for any metastatic disease to the brain. She is  scheduled to complete her concurrent radiation on 04/03/2019.  She was supposed to have a restaging CT scan performed prior to her appointment today, but it was not scheduled for today's visit due to issues with scheduling.  She is scheduled to have a CT scan on 03/30/2019. She is here for eavluation before starting cycle #3.    MEDICAL HISTORY: Past Medical History:  Diagnosis Date  . Carotid bruit    Bilateral  . Chest pain    occasionally, takes nitro if needed, at rest (1-2 times a month)  . Colon polyps    with divertirticulitis  . COPD (chronic obstructive pulmonary disease) ([facility])   . DJD (degenerative joint disease)   . Emphysema lung ([facility])   . Family history of adverse reaction to anesthesia    mom PONV  . Hyperlipidemia   . Hypertension   . Lung cancer ([facility])   . Lymph node cancer ([facility])   . Osteopenia   . Prediabetes   . Vitamin D deficiency     ALLERGIES:  is allergic to dilaudid [hydromorphone hcl]; fentanyl; penicillins; and crestor [rosuvastatin].  MEDICATIONS:  Current Outpatient Medications  Medication Sig Dispense Refill  . albuterol (PROAIR HFA) 108 (90 Base) MCG/ACT inhaler Inhale 2 puffs into the lungs every 6 (six) hours as needed for wheezing or shortness of breath. 18 g 1  . ALPRAZolam (XANAX) 0.5 MG tablet TAKE 1/2 TO 1 (ONE-HALF TO ONE) TABLET BY MOUTH TWICE DAILY AS NEEDED FOR ANXIETY OR  SLEEP 60 tablet 2  . aspirin 81 MG tablet Take 162  mg by mouth daily.     Marland Kitchen atenolol (TENORMIN) 25 MG tablet Take 1 tablet Daily for BP (Patient taking differently: Take 25 mg by mouth daily. ) 90 tablet 3  . cholecalciferol (VITAMIN D) 1000 units tablet Take 1,000 Units by mouth daily.     . diphenhydramine-acetaminophen (TYLENOL PM) 25-500 MG TABS tablet Take 1 tablet by mouth at bedtime as needed (sleep).     . ezetimibe (ZETIA) 10 MG tablet TAKE 1 TABLET BY MOUTH ONCE DAILY FOR CHOLESTEROL (Patient taking differently: Take 10 mg by mouth daily. ) 90 tablet 0  .  ibuprofen (ADVIL) 200 MG tablet Take 600 mg by mouth every 6 (six) hours as needed for headache or mild pain.    . isosorbide mononitrate (IMDUR) 30 MG 24 hr tablet Take 30 mg by mouth daily.    Marland Kitchen lidocaine (XYLOCAINE) 2 % solution 5 ml swish and swallow or spit for mouth pain/pain with swallowing. May add to Magic Mouthwash 200 mL 1  . lidocaine-prilocaine (EMLA) cream Apply 1 application topically as needed. 30 g 0  . magic mouthwash SOLN 5 ml swish and spit QID prn mouth pain 240 mL 1  . nitroGLYCERIN (NITROSTAT) 0.4 MG SL tablet Place 1 tablet (0.4 mg total) under the tongue every 5 (five) minutes as needed for chest pain. 30 tablet 3  . ondansetron (ZOFRAN) 8 MG tablet Take 1 tablet (8 mg total) by mouth every 12 (twelve) hours as needed for nausea or vomiting. 40 tablet 1  . oxyCODONE (OXY IR/ROXICODONE) 5 MG immediate release tablet Take 1 tablet (5 mg total) by mouth every 4 (four) hours as needed for moderate pain or breakthrough pain. 20 tablet 0  . prochlorperazine (COMPAZINE) 10 MG tablet TAKE ONE TABLET BY MOUTH EVERY 6 HOURS AS NEEDED FOR NAUSEA AND VOMITING 30 tablet 0  . sucralfate (CARAFATE) 1 g tablet Take 1 tablet (1 g total) by mouth 4 (four) times daily. 120 tablet 2  . dexamethasone (DECADRON) 4 MG tablet Take 1 tablet (4 mg total) by mouth 2 (two) times daily with a meal. Please take 1 tablet twice a day for 3-4 days after chemotherapy. 8 tablet 0   No current facility-administered medications for this visit.     SURGICAL HISTORY:  Past Surgical History:  Procedure Laterality Date  . CATARACT EXTRACTION Bilateral 03/17/2017  . COLONOSCOPY    . ESOPHAGOGASTRODUODENOSCOPY    . HAND SURGERY    . head injury  1995  . LAMINECTOMY  2002   L 5   . NECK SURGERY  2012    c4 -5/6  fushion  . PORTACATH PLACEMENT Left 02/19/2019   Procedure: INSERTION PORT-A-CATH;  Surgeon: Melrose Nakayama, MD;  Location: [facility];  Service: Thoracic;  Laterality: Left;  placed left  subclavian  . TONSILLECTOMY    . TRIGGER FINGER RELEASE    . VIDEO BRONCHOSCOPY WITH ENDOBRONCHIAL ULTRASOUND N/A 01/25/2019   Procedure: VIDEO BRONCHOSCOPY WITH ENDOBRONCHIAL ULTRASOUND;  Surgeon: Melrose Nakayama, MD;  Location: [facility] OR;  Service: Thoracic;  Laterality: N/A;    REVIEW OF SYSTEMS:   Review of Systems  Constitutional: Positive for fatigue, decreased appetite secondary to nausea, and weight loss. Negative for appetite change, chills, and fever. HENT: Negative for mouth sores, nosebleeds, sore throat and trouble swallowing.   Eyes: Positive for bilateral visual blurring. Negative for diplopiadiplopia, photophobia, visual field loss and icterus.  Respiratory: Positive for baseline cough. Negative for hemoptysis, shortness of breath and wheezing.  Cardiovascular: Negative for chest pain and leg swelling.  Gastrointestinal: Positive for nausea and mild vomiting. Negative for abdominal pain, constipation, and diarrhea. Genitourinary: Negative for bladder incontinence, difficulty urinating, dysuria, frequency and hematuria.   Musculoskeletal: Negative for back pain, gait problem, neck pain and neck stiffness.  Skin: Negative for itching and rash.  Neurological: Negative for dizziness, extremity weakness, gait problem, headaches, light-headedness and seizures.  Hematological: Negative for adenopathy. Does not bruise/bleed easily.  Psychiatric/Behavioral: Negative for confusion, depression and sleep disturbance. The patient is not nervous/anxious.     PHYSICAL EXAMINATION:  Blood pressure 94/66, pulse 94, temperature 98.5 F (36.9 C), temperature source Temporal, resp. rate 18, height 5\' 2"  (1.575 m), weight 98 lb 14.4 oz (44.9 kg), SpO2 99 %.  ECOG PERFORMANCE STATUS: 1 - Symptomatic but completely ambulatory  Physical Exam  Constitutional: Oriented to person, place, and time and thin appearing female and in no distress. HENT:  Head: Normocephalic and atraumatic.   Mouth/Throat: Oropharynx is clear and moist. No oropharyngeal exudate.  Eyes: Conjunctivae are normal. Right eye exhibits no discharge. Left eye exhibits no discharge. No scleral icterus.  Neck: Normal range of motion. Neck supple.  Cardiovascular: Normal rate, regular rhythm, normal heart sounds and intact distal pulses.   Pulmonary/Chest: Effort normal and breath sounds normal. No respiratory distress. No wheezes. No rales.  Abdominal: Soft. Bowel sounds are normal. Exhibits no distension and no mass. There is no tenderness.  Musculoskeletal: Normal range of motion. Exhibits no edema.  Lymphadenopathy:    No cervical adenopathy.  Neurological: Alert and oriented to person, place, and time. Exhibits normal muscle tone. Gait normal. Coordination normal.  Skin: Skin is warm and dry. No rash noted. Not diaphoretic. No erythema. No pallor.  Psychiatric: Mood, memory and judgment normal.  Vitals reviewed.  LABORATORY DATA: Lab Results  Component Value Date   WBC 7.5 03/26/2019   HGB 9.6 (L) 03/26/2019   HCT 27.4 (L) 03/26/2019   MCV 94.8 03/26/2019   PLT 96 (L) 03/26/2019      Chemistry      Component Value Date/Time   NA 135 03/26/2019 0930   K 3.6 03/26/2019 0930   CL 94 (L) 03/26/2019 0930   CO2 26 03/26/2019 0930   BUN 9 03/26/2019 0930   CREATININE 0.81 03/26/2019 0930   CREATININE 0.80 11/27/2018 1522      Component Value Date/Time   CALCIUM 9.4 03/26/2019 0930   ALKPHOS 85 03/26/2019 0930   AST 16 03/26/2019 0930   ALT 8 03/26/2019 0930   BILITOT <0.2 (L) 03/26/2019 0930       RADIOGRAPHIC STUDIES:  No results found.   ASSESSMENT/PLAN:  This is a very pleasant 73 year old Caucasian female diagnosed with limited stage small cell lung cancer, she presented with a right upper lobe lung mass in addition to satellite nodules as well as right hilar, mediastinal, and right supraclavicular lymphadenopathy.  She was diagnosed in September 2020.  The patient is  currently undergoing systemic chemotherapy with carboplatin for an AUC of 5 on day 1 and etoposide on days 1, 2, and 3 IV every 3 weeks with Neulasta support.  She is also undergoing concurrent radiation.  She is status post 2 cycles. She was neutropenic after her last treatment requiring 3 Zarxio injections.   The patient was seen with Dr. Julien Nordmann today. Labs were reviewed. Her labs are adequate for treatment today.  We recommend we proceed with cycle #3 today as scheduled.   We will see the  patient back for a follow up visit in 3 weeks for evaluation before starting cycle #4.   She will proceed with her restaging CT scan on 03/30/2019 as scheduled.   I will put in a referral to the nutritionist team here at the cancer center for evaluation and further recommendations regarding her weight loss.   The patient's blood pressure was noted to be low today. She takes antihypertensives and took her medications today as prescribed. I advised the patient to monitor her BP at home before taking her antihypertensives. If her blood pressure continues to be low, she should reach out to her prescribing doctor to see if her dose needs to be adjusted. She was encouraged to continue to drink plenty of fluid.   For her persistent nausea, the patient was prescribed decadron 4 mg BID for 3-4 days for her nausea in addition to compazine.   The patient was advised to call immediately if she has any concerning symptoms in the interval. The patient voices understanding of current disease status and treatment options and is in agreement with the current care plan. All questions were answered. The patient knows to call the clinic with any problems, questions or concerns. We can certainly see the patient much sooner if necessary    Orders Placed This Encounter  Procedures  . Ambulatory referral to Nutrition and Diabetic E    Referral Priority:   Routine    Referral Type:   Consultation    Referral Reason:   Specialty  Services Required    Number of Visits Requested:   [place], PA-C 03/26/19  ADDENDUM: Hematology/Oncology Attending: I had a face-to-face encounter with the patient today.  I recommended her care plan.  This is a very pleasant 73 years old white female with limited stage small cell lung cancer and she is currently undergoing systemic chemotherapy with carboplatin and 2 etoposide status post 2 cycles.  This is concurrent with radiation and the patient is tolerating her treatment well.  She missed her appointment for the Udenyca injection and she has significant neutropenia with the second cycle of her treatment. I recommended for her to proceed with cycle #3 today as planned. We will make sure the patient receives her Udenyca injection after her treatment. We will arrange for the patient to have repeat CT scan of the chest before her upcoming visit. For the malnutrition, I will refer the patient to the dietitian at the cancer center for evaluation. The patient will come back for follow-up visit in 3 weeks for evaluation before the next cycle of her treatment. She was advised to call immediately if she has any concerning symptoms in the interval.  Disclaimer: This note was dictated with voice recognition software. Similar sounding words can inadvertently be transcribed and may be missed upon review. Eilleen Kempf, MD 03/26/19

## 2019-03-27 ENCOUNTER — Ambulatory Visit: Payer: Medicare HMO

## 2019-03-27 ENCOUNTER — Inpatient Hospital Stay: Payer: Medicare HMO

## 2019-03-27 ENCOUNTER — Other Ambulatory Visit: Payer: Self-pay

## 2019-03-27 ENCOUNTER — Ambulatory Visit
Admission: RE | Admit: 2019-03-27 | Discharge: 2019-03-27 | Disposition: A | Discharge status: Home / Self Care | Payer: Medicare HMO | Source: Ambulatory Visit | Attending: Radiation Oncology | Admitting: Radiation Oncology

## 2019-03-27 VITALS — BP 118/74 | HR 95 | Temp 98.2°F | Resp 16

## 2019-03-27 DIAGNOSIS — E785 Hyperlipidemia, unspecified: Secondary | ICD-10-CM | POA: Diagnosis not present

## 2019-03-27 DIAGNOSIS — Z791 Long term (current) use of non-steroidal anti-inflammatories (NSAID): Secondary | ICD-10-CM | POA: Diagnosis not present

## 2019-03-27 DIAGNOSIS — M8580 Other specified disorders of bone density and structure, unspecified site: Secondary | ICD-10-CM | POA: Diagnosis not present

## 2019-03-27 DIAGNOSIS — D701 Agranulocytosis secondary to cancer chemotherapy: Secondary | ICD-10-CM | POA: Diagnosis not present

## 2019-03-27 DIAGNOSIS — Z510 Encounter for antineoplastic radiation therapy: Secondary | ICD-10-CM | POA: Diagnosis not present

## 2019-03-27 DIAGNOSIS — Z7982 Long term (current) use of aspirin: Secondary | ICD-10-CM | POA: Diagnosis not present

## 2019-03-27 DIAGNOSIS — I10 Essential (primary) hypertension: Secondary | ICD-10-CM | POA: Diagnosis not present

## 2019-03-27 DIAGNOSIS — C3411 Malignant neoplasm of upper lobe, right bronchus or lung: Secondary | ICD-10-CM | POA: Diagnosis not present

## 2019-03-27 DIAGNOSIS — Z5189 Encounter for other specified aftercare: Secondary | ICD-10-CM | POA: Diagnosis not present

## 2019-03-27 DIAGNOSIS — Z5111 Encounter for antineoplastic chemotherapy: Secondary | ICD-10-CM | POA: Diagnosis not present

## 2019-03-27 DIAGNOSIS — J449 Chronic obstructive pulmonary disease, unspecified: Secondary | ICD-10-CM | POA: Diagnosis not present

## 2019-03-27 MED ORDER — SODIUM CHLORIDE 0.9 % IV SOLN
120.0000 mg/m2 | Freq: Once | INTRAVENOUS | Status: AC
  Administered 2019-03-27: 180 mg via INTRAVENOUS
  Filled 2019-03-27: qty 9

## 2019-03-27 MED ORDER — SODIUM CHLORIDE 0.9% FLUSH
10.0000 mL | INTRAVENOUS | Status: DC | PRN
  Administered 2019-03-27: 10 mL
  Filled 2019-03-27: qty 10

## 2019-03-27 MED ORDER — DEXAMETHASONE SODIUM PHOSPHATE 10 MG/ML IJ SOLN
INTRAMUSCULAR | Status: AC
  Filled 2019-03-27: qty 1

## 2019-03-27 MED ORDER — SODIUM CHLORIDE 0.9 % IV SOLN
Freq: Once | INTRAVENOUS | Status: AC
  Administered 2019-03-27: 15:00:00 via INTRAVENOUS
  Filled 2019-03-27: qty 250

## 2019-03-27 MED ORDER — DEXAMETHASONE SODIUM PHOSPHATE 10 MG/ML IJ SOLN
10.0000 mg | Freq: Once | INTRAMUSCULAR | Status: AC
  Administered 2019-03-27: 10 mg via INTRAVENOUS

## 2019-03-27 MED ORDER — HEPARIN SOD (PORK) LOCK FLUSH 100 UNIT/ML IV SOLN
500.0000 [IU] | Freq: Once | INTRAVENOUS | Status: AC | PRN
  Administered 2019-03-27: 500 [IU]
  Filled 2019-03-27: qty 5

## 2019-03-27 NOTE — Patient Instructions (Signed)
[facility] Discharge Instructions for Patients Receiving Chemotherapy  Today you received the following chemotherapy agents Etoposide  To help prevent nausea and vomiting after your treatment, we encourage you to take your nausea medication as directed.   If you develop nausea and vomiting that is not controlled by your nausea medication, call the clinic.   BELOW ARE SYMPTOMS THAT SHOULD BE REPORTED IMMEDIATELY:  *FEVER GREATER THAN 100.5 F  *CHILLS WITH OR WITHOUT FEVER  NAUSEA AND VOMITING THAT IS NOT CONTROLLED WITH YOUR NAUSEA MEDICATION  *UNUSUAL SHORTNESS OF BREATH  *UNUSUAL BRUISING OR BLEEDING  TENDERNESS IN MOUTH AND THROAT WITH OR WITHOUT PRESENCE OF ULCERS  *URINARY PROBLEMS  *BOWEL PROBLEMS  UNUSUAL RASH Items with * indicate a potential emergency and should be followed up as soon as possible.  Feel free to call the clinic should you have any questions or concerns. The clinic phone number is [phone].  Please show the [facility] at check-in to the Emergency Department and triage nurse.

## 2019-03-28 ENCOUNTER — Other Ambulatory Visit: Payer: Self-pay

## 2019-03-28 ENCOUNTER — Ambulatory Visit: Payer: Medicare HMO

## 2019-03-28 ENCOUNTER — Ambulatory Visit
Admission: RE | Admit: 2019-03-28 | Discharge: 2019-03-28 | Disposition: A | Discharge status: Home / Self Care | Payer: Medicare HMO | Source: Ambulatory Visit | Attending: Radiation Oncology | Admitting: Radiation Oncology

## 2019-03-28 ENCOUNTER — Inpatient Hospital Stay: Payer: Medicare HMO

## 2019-03-28 VITALS — BP 120/82 | HR 92 | Temp 98.2°F | Resp 18

## 2019-03-28 DIAGNOSIS — Z510 Encounter for antineoplastic radiation therapy: Secondary | ICD-10-CM | POA: Diagnosis not present

## 2019-03-28 DIAGNOSIS — M8580 Other specified disorders of bone density and structure, unspecified site: Secondary | ICD-10-CM | POA: Diagnosis not present

## 2019-03-28 DIAGNOSIS — Z5111 Encounter for antineoplastic chemotherapy: Secondary | ICD-10-CM | POA: Diagnosis not present

## 2019-03-28 DIAGNOSIS — J449 Chronic obstructive pulmonary disease, unspecified: Secondary | ICD-10-CM | POA: Diagnosis not present

## 2019-03-28 DIAGNOSIS — Z7982 Long term (current) use of aspirin: Secondary | ICD-10-CM | POA: Diagnosis not present

## 2019-03-28 DIAGNOSIS — D701 Agranulocytosis secondary to cancer chemotherapy: Secondary | ICD-10-CM | POA: Diagnosis not present

## 2019-03-28 DIAGNOSIS — C3411 Malignant neoplasm of upper lobe, right bronchus or lung: Secondary | ICD-10-CM

## 2019-03-28 DIAGNOSIS — I10 Essential (primary) hypertension: Secondary | ICD-10-CM | POA: Diagnosis not present

## 2019-03-28 DIAGNOSIS — Z791 Long term (current) use of non-steroidal anti-inflammatories (NSAID): Secondary | ICD-10-CM | POA: Diagnosis not present

## 2019-03-28 DIAGNOSIS — E785 Hyperlipidemia, unspecified: Secondary | ICD-10-CM | POA: Diagnosis not present

## 2019-03-28 DIAGNOSIS — Z5189 Encounter for other specified aftercare: Secondary | ICD-10-CM | POA: Diagnosis not present

## 2019-03-28 MED ORDER — SODIUM CHLORIDE 0.9% FLUSH
10.0000 mL | INTRAVENOUS | Status: DC | PRN
  Administered 2019-03-28: 10 mL
  Filled 2019-03-28: qty 10

## 2019-03-28 MED ORDER — SODIUM CHLORIDE 0.9 % IV SOLN
120.0000 mg/m2 | Freq: Once | INTRAVENOUS | Status: AC
  Administered 2019-03-28: 180 mg via INTRAVENOUS
  Filled 2019-03-28: qty 9

## 2019-03-28 MED ORDER — DEXAMETHASONE SODIUM PHOSPHATE 10 MG/ML IJ SOLN
10.0000 mg | Freq: Once | INTRAMUSCULAR | Status: AC
  Administered 2019-03-28: 10 mg via INTRAVENOUS

## 2019-03-28 MED ORDER — SODIUM CHLORIDE 0.9 % IV SOLN
Freq: Once | INTRAVENOUS | Status: AC
  Administered 2019-03-28: 12:00:00 via INTRAVENOUS
  Filled 2019-03-28: qty 250

## 2019-03-28 MED ORDER — HEPARIN SOD (PORK) LOCK FLUSH 100 UNIT/ML IV SOLN
500.0000 [IU] | Freq: Once | INTRAVENOUS | Status: AC | PRN
  Administered 2019-03-28: 500 [IU]
  Filled 2019-03-28: qty 5

## 2019-03-28 MED ORDER — DEXAMETHASONE SODIUM PHOSPHATE 10 MG/ML IJ SOLN
INTRAMUSCULAR | Status: AC
  Filled 2019-03-28: qty 1

## 2019-03-28 NOTE — Patient Instructions (Signed)
[facility] Discharge Instructions for Patients Receiving Chemotherapy  Today you received the following chemotherapy agents Etoposide  To help prevent nausea and vomiting after your treatment, we encourage you to take your nausea medication as directed.   If you develop nausea and vomiting that is not controlled by your nausea medication, call the clinic.   BELOW ARE SYMPTOMS THAT SHOULD BE REPORTED IMMEDIATELY:  *FEVER GREATER THAN 100.5 F  *CHILLS WITH OR WITHOUT FEVER  NAUSEA AND VOMITING THAT IS NOT CONTROLLED WITH YOUR NAUSEA MEDICATION  *UNUSUAL SHORTNESS OF BREATH  *UNUSUAL BRUISING OR BLEEDING  TENDERNESS IN MOUTH AND THROAT WITH OR WITHOUT PRESENCE OF ULCERS  *URINARY PROBLEMS  *BOWEL PROBLEMS  UNUSUAL RASH Items with * indicate a potential emergency and should be followed up as soon as possible.  Feel free to call the clinic should you have any questions or concerns. The clinic phone number is [phone].  Please show the [facility] at check-in to the Emergency Department and triage nurse.

## 2019-03-29 ENCOUNTER — Ambulatory Visit: Payer: Medicare HMO

## 2019-03-30 ENCOUNTER — Ambulatory Visit: Payer: Medicare HMO

## 2019-03-30 ENCOUNTER — Encounter (HOSPITAL_COMMUNITY): Payer: Self-pay

## 2019-03-30 ENCOUNTER — Inpatient Hospital Stay: Payer: Medicare HMO

## 2019-03-30 ENCOUNTER — Ambulatory Visit (HOSPITAL_COMMUNITY)
Admission: RE | Admit: 2019-03-30 | Discharge: 2019-03-30 | Disposition: A | Discharge status: Home / Self Care | Payer: Medicare HMO | Source: Ambulatory Visit | Attending: Internal Medicine | Admitting: Internal Medicine

## 2019-03-30 ENCOUNTER — Other Ambulatory Visit: Payer: Self-pay

## 2019-03-30 VITALS — BP 128/72 | HR 82 | Temp 98.2°F | Resp 18

## 2019-03-30 DIAGNOSIS — Z5189 Encounter for other specified aftercare: Secondary | ICD-10-CM | POA: Diagnosis not present

## 2019-03-30 DIAGNOSIS — E785 Hyperlipidemia, unspecified: Secondary | ICD-10-CM | POA: Diagnosis not present

## 2019-03-30 DIAGNOSIS — C3481 Malignant neoplasm of overlapping sites of right bronchus and lung: Secondary | ICD-10-CM | POA: Diagnosis present

## 2019-03-30 DIAGNOSIS — J449 Chronic obstructive pulmonary disease, unspecified: Secondary | ICD-10-CM | POA: Diagnosis not present

## 2019-03-30 DIAGNOSIS — C3411 Malignant neoplasm of upper lobe, right bronchus or lung: Secondary | ICD-10-CM

## 2019-03-30 DIAGNOSIS — Z791 Long term (current) use of non-steroidal anti-inflammatories (NSAID): Secondary | ICD-10-CM | POA: Diagnosis not present

## 2019-03-30 DIAGNOSIS — M8580 Other specified disorders of bone density and structure, unspecified site: Secondary | ICD-10-CM | POA: Diagnosis not present

## 2019-03-30 DIAGNOSIS — Z5111 Encounter for antineoplastic chemotherapy: Secondary | ICD-10-CM | POA: Diagnosis not present

## 2019-03-30 DIAGNOSIS — D701 Agranulocytosis secondary to cancer chemotherapy: Secondary | ICD-10-CM | POA: Diagnosis not present

## 2019-03-30 DIAGNOSIS — I10 Essential (primary) hypertension: Secondary | ICD-10-CM | POA: Diagnosis not present

## 2019-03-30 DIAGNOSIS — J439 Emphysema, unspecified: Secondary | ICD-10-CM | POA: Diagnosis not present

## 2019-03-30 DIAGNOSIS — R918 Other nonspecific abnormal finding of lung field: Secondary | ICD-10-CM | POA: Diagnosis not present

## 2019-03-30 DIAGNOSIS — Z7982 Long term (current) use of aspirin: Secondary | ICD-10-CM | POA: Diagnosis not present

## 2019-03-30 MED ORDER — PEGFILGRASTIM-CBQV 6 MG/0.6ML ~~LOC~~ SOSY
6.0000 mg | PREFILLED_SYRINGE | Freq: Once | SUBCUTANEOUS | Status: AC
  Administered 2019-03-30: 13:00:00 6 mg via SUBCUTANEOUS

## 2019-03-30 MED ORDER — HEPARIN SOD (PORK) LOCK FLUSH 100 UNIT/ML IV SOLN
500.0000 [IU] | Freq: Once | INTRAVENOUS | Status: AC
  Administered 2019-03-30: 500 [IU] via INTRAVENOUS

## 2019-03-30 MED ORDER — PEGFILGRASTIM-CBQV 6 MG/0.6ML ~~LOC~~ SOSY
PREFILLED_SYRINGE | SUBCUTANEOUS | Status: AC
  Filled 2019-03-30: qty 0.6

## 2019-03-30 MED ORDER — SODIUM CHLORIDE (PF) 0.9 % IJ SOLN
INTRAMUSCULAR | Status: AC
  Filled 2019-03-30: qty 50

## 2019-03-30 MED ORDER — IOHEXOL 300 MG/ML  SOLN
75.0000 mL | Freq: Once | INTRAMUSCULAR | Status: AC | PRN
  Administered 2019-03-30: 75 mL via INTRAVENOUS

## 2019-03-30 MED ORDER — HEPARIN SOD (PORK) LOCK FLUSH 100 UNIT/ML IV SOLN
INTRAVENOUS | Status: AC
  Filled 2019-03-30: qty 5

## 2019-03-30 NOTE — Patient Instructions (Signed)

## 2019-04-01 ENCOUNTER — Other Ambulatory Visit: Payer: Self-pay

## 2019-04-01 ENCOUNTER — Emergency Department (HOSPITAL_COMMUNITY)
Admission: EM | Admit: 2019-04-01 | Discharge: 2019-04-01 | Disposition: A | Discharge status: Home / Self Care | Payer: Medicare HMO | Source: Home / Self Care | Attending: Emergency Medicine | Admitting: Emergency Medicine

## 2019-04-01 ENCOUNTER — Emergency Department (HOSPITAL_COMMUNITY): Payer: Medicare HMO

## 2019-04-01 ENCOUNTER — Encounter (HOSPITAL_COMMUNITY): Payer: Self-pay | Admitting: Emergency Medicine

## 2019-04-01 DIAGNOSIS — Z79899 Other long term (current) drug therapy: Secondary | ICD-10-CM | POA: Insufficient documentation

## 2019-04-01 DIAGNOSIS — R069 Unspecified abnormalities of breathing: Secondary | ICD-10-CM | POA: Diagnosis not present

## 2019-04-01 DIAGNOSIS — R5081 Fever presenting with conditions classified elsewhere: Secondary | ICD-10-CM | POA: Diagnosis not present

## 2019-04-01 DIAGNOSIS — R531 Weakness: Secondary | ICD-10-CM | POA: Diagnosis not present

## 2019-04-01 DIAGNOSIS — Z20828 Contact with and (suspected) exposure to other viral communicable diseases: Secondary | ICD-10-CM | POA: Insufficient documentation

## 2019-04-01 DIAGNOSIS — E860 Dehydration: Secondary | ICD-10-CM

## 2019-04-01 DIAGNOSIS — C3490 Malignant neoplasm of unspecified part of unspecified bronchus or lung: Secondary | ICD-10-CM

## 2019-04-01 DIAGNOSIS — F17210 Nicotine dependence, cigarettes, uncomplicated: Secondary | ICD-10-CM | POA: Insufficient documentation

## 2019-04-01 DIAGNOSIS — Z7982 Long term (current) use of aspirin: Secondary | ICD-10-CM | POA: Insufficient documentation

## 2019-04-01 DIAGNOSIS — R000 Tachycardia, unspecified: Secondary | ICD-10-CM | POA: Diagnosis not present

## 2019-04-01 DIAGNOSIS — B370 Candidal stomatitis: Secondary | ICD-10-CM

## 2019-04-01 DIAGNOSIS — R079 Chest pain, unspecified: Secondary | ICD-10-CM | POA: Diagnosis not present

## 2019-04-01 DIAGNOSIS — J449 Chronic obstructive pulmonary disease, unspecified: Secondary | ICD-10-CM | POA: Insufficient documentation

## 2019-04-01 DIAGNOSIS — D709 Neutropenia, unspecified: Secondary | ICD-10-CM | POA: Diagnosis not present

## 2019-04-01 DIAGNOSIS — R918 Other nonspecific abnormal finding of lung field: Secondary | ICD-10-CM | POA: Diagnosis not present

## 2019-04-01 DIAGNOSIS — I10 Essential (primary) hypertension: Secondary | ICD-10-CM | POA: Insufficient documentation

## 2019-04-01 DIAGNOSIS — R05 Cough: Secondary | ICD-10-CM | POA: Diagnosis not present

## 2019-04-01 DIAGNOSIS — R0602 Shortness of breath: Secondary | ICD-10-CM | POA: Diagnosis not present

## 2019-04-01 DIAGNOSIS — A419 Sepsis, unspecified organism: Secondary | ICD-10-CM | POA: Diagnosis not present

## 2019-04-01 LAB — TROPONIN I (HIGH SENSITIVITY)
Troponin I (High Sensitivity): 4 ng/L (ref <18)
Troponin I (High Sensitivity): 3 ng/L (ref <18)

## 2019-04-01 LAB — URINALYSIS, ROUTINE W REFLEX MICROSCOPIC
Bilirubin Urine: NEGATIVE
Glucose, UA: NEGATIVE mg/dL
Hgb urine dipstick: NEGATIVE
Ketones, ur: 80 mg/dL — AB
Leukocytes,Ua: NEGATIVE
Nitrite: NEGATIVE
Protein, ur: NEGATIVE mg/dL
Specific Gravity, Urine: 1.013 (ref 1.005–1.030)
pH: 6 (ref 5.0–8.0)

## 2019-04-01 LAB — LACTIC ACID, PLASMA
Lactic Acid, Venous: 1.1 mmol/L (ref 0.5–1.9)
Lactic Acid, Venous: 0.7 mmol/L (ref 0.5–1.9)

## 2019-04-01 LAB — BASIC METABOLIC PANEL
Anion gap: 13 (ref 5–15)
BUN: 10 mg/dL (ref 8–23)
CO2: 27 mmol/L (ref 22–32)
Calcium: 9.1 mg/dL (ref 8.9–10.3)
Chloride: 91 mmol/L — ABNORMAL LOW (ref 98–111)
Creatinine, Ser: 0.62 mg/dL (ref 0.44–1.00)
GFR calc Af Amer: >60 mL/min (ref >60)
GFR calc non Af Amer: >60 mL/min (ref >60)
Glucose, Bld: 112 mg/dL — ABNORMAL HIGH (ref 70–99)
Potassium: 3.3 mmol/L — ABNORMAL LOW (ref 3.5–5.1)
Sodium: 131 mmol/L — ABNORMAL LOW (ref 135–145)

## 2019-04-01 LAB — BRAIN NATRIURETIC PEPTIDE: B Natriuretic Peptide: 77 pg/mL (ref 0.0–100.0)

## 2019-04-01 MED ORDER — FLUCONAZOLE 100 MG PO TABS
200.0000 mg | ORAL_TABLET | Freq: Once | ORAL | Status: AC
  Administered 2019-04-01: 200 mg via ORAL
  Filled 2019-04-01: qty 2

## 2019-04-01 MED ORDER — LIDOCAINE VISCOUS HCL 2 % MT SOLN
15.0000 mL | Freq: Once | OROMUCOSAL | Status: AC
  Administered 2019-04-01: 15 mL via OROMUCOSAL
  Filled 2019-04-01: qty 15

## 2019-04-01 MED ORDER — SODIUM CHLORIDE 0.9 % IV BOLUS
1000.0000 mL | Freq: Once | INTRAVENOUS | Status: AC
  Administered 2019-04-01: 1000 mL via INTRAVENOUS

## 2019-04-01 MED ORDER — FLUCONAZOLE 200 MG PO TABS: 200.0000 mg | ORAL_TABLET | Freq: Every day | ORAL | 0 refills | Status: AC

## 2019-04-01 MED ORDER — HEPARIN SOD (PORK) LOCK FLUSH 100 UNIT/ML IV SOLN
500.0000 [IU] | Freq: Once | INTRAVENOUS | Status: AC
  Administered 2019-04-01: 500 [IU]
  Filled 2019-04-01: qty 5

## 2019-04-01 MED ORDER — LIDOCAINE VISCOUS HCL 2 % MT SOLN: 15.0000 mL | Freq: Four times a day (QID) | OROMUCOSAL | 0 refills | Status: AC | PRN

## 2019-04-01 MED ORDER — ONDANSETRON HCL 4 MG/2ML IJ SOLN
4.0000 mg | Freq: Once | INTRAMUSCULAR | Status: AC
  Administered 2019-04-01: 4 mg via INTRAVENOUS
  Filled 2019-04-01: qty 2

## 2019-04-01 NOTE — ED Notes (Signed)
Patient unable to void

## 2019-04-01 NOTE — ED Provider Notes (Signed)
[facility] EMERGENCY DEPARTMENT Provider Note   CSN: [id number] Arrival date & time: 04/01/19  0915     History   Chief Complaint Chief Complaint  Patient presents with  . Weakness    HPI Stacy [name] is a 73 y.o. female.     Pt presents to the ED today with weakness and dehydration.  The pt is currently undergoing chemo/radiation for lung cancer.  She last received chemo on 11/25 with etoposide and received Neulasta on 11/27.  Pt has been very nauseous and it hurts to swallow.  The only thing she's been able to swallow has been ice chips.  The pt said she's starting to hallucinate.  Her oncologist added decadron to compazine and zofran for nausea on the 23rd.  Pt's bp has been low and so she's been cutting her atenolol dose in half.  She did not take any of that today. She last took it yesterday.  Pt denies any fever.  She has had a cough, but it is unchanged from usual.      Past Medical History:  Diagnosis Date  . Carotid bruit    Bilateral  . Chest pain    occasionally, takes nitro if needed, at rest (1-2 times a month)  . Colon polyps    with divertirticulitis  . COPD (chronic obstructive pulmonary disease) ([place])   . DJD (degenerative joint disease)   . Emphysema lung ([place])   . Family history of adverse reaction to anesthesia    mom PONV  . Hyperlipidemia   . Hypertension   . Lung cancer ([place])   . Lymph node cancer ([place])   . Osteopenia   . Prediabetes   . Vitamin D deficiency     Patient Active Problem List   Diagnosis Date Noted  . Weight loss 03/26/2019  . Chemotherapy induced neutropenia ([place]) 02/26/2019  . Small cell lung cancer, overlapping sites of right lung ([place]) 01/29/2019  . Encounter for antineoplastic chemotherapy 01/29/2019  . Goals of care, counseling/discussion 01/29/2019  . Exertional chest pain 01/23/2019  . Peripheral artery disease ([place]) 01/23/2019  . Malignant neoplasm of upper lobe of right lung ([place]) 01/11/2019  . Insomnia  05/19/2018  . Smokers' cough ([place]) 06/23/2017  . Atherosclerosis of aorta ([place]) 06/23/2017  . History of diverticulitis 06/23/2017  . COPD (chronic obstructive pulmonary disease) ([place]) 04/02/2017  . Interstitial lung disease ([place]) 04/02/2017  . Tobacco abuse 03/23/2017  . BMI 20.0-20.9, adult 02/17/2015  . Poor compliance with medication 02/17/2015  . Medication management 06/10/2014  . Hypertension   . Mixed hyperlipidemia   . Vitamin D deficiency   . Prediabetes   . DJD (degenerative joint disease)     Past Surgical History:  Procedure Laterality Date  . CATARACT EXTRACTION Bilateral 03/17/2017  . COLONOSCOPY    . ESOPHAGOGASTRODUODENOSCOPY    . HAND SURGERY    . head injury  1995  . LAMINECTOMY  2002   L 5   . NECK SURGERY  2012    c4 -5/6  fushion  . PORTACATH PLACEMENT Left 02/19/2019   Procedure: INSERTION PORT-A-CATH;  Surgeon: Melrose Nakayama, MD;  Location: [facility];  Service: Thoracic;  Laterality: Left;  placed left subclavian  . TONSILLECTOMY    . TRIGGER FINGER RELEASE    . VIDEO BRONCHOSCOPY WITH ENDOBRONCHIAL ULTRASOUND N/A 01/25/2019   Procedure: VIDEO BRONCHOSCOPY WITH ENDOBRONCHIAL ULTRASOUND;  Surgeon: Melrose Nakayama, MD;  Location: [facility];  Service: Thoracic;  Laterality: N/A;  OB History   No obstetric history on file.      Home Medications    Prior to Admission medications   Medication Sig Start Date End Date Taking? Authorizing Provider  albuterol (PROAIR HFA) 108 (90 Base) MCG/ACT inhaler Inhale 2 puffs into the lungs every 6 (six) hours as needed for wheezing or shortness of breath. 06/23/17   Vicie Mutters, PA-C  ALPRAZolam Duanne Moron) 0.5 MG tablet TAKE 1/2 TO 1 (ONE-HALF TO ONE) TABLET BY MOUTH TWICE DAILY AS NEEDED FOR ANXIETY OR  SLEEP 01/22/19   Vicie Mutters, PA-C  aspirin 81 MG tablet Take 162 mg by mouth daily.     [provider]  atenolol (TENORMIN) 25 MG tablet Take 1 tablet Daily for BP Patient taking  differently: Take 25 mg by mouth daily.  08/25/18   [name], MD  cholecalciferol (VITAMIN D) 1000 units tablet Take 1,000 Units by mouth daily.     [provider]  dexamethasone (DECADRON) 4 MG tablet Take 1 tablet (4 mg total) by mouth 2 (two) times daily with a meal. Please take 1 tablet twice a day for 3-4 days after chemotherapy. 03/26/19   Heilingoetter, Cassandra L, PA-C  diphenhydramine-acetaminophen (TYLENOL PM) 25-500 MG TABS tablet Take 1 tablet by mouth at bedtime as needed (sleep).     [provider]  ezetimibe (ZETIA) 10 MG tablet TAKE 1 TABLET BY MOUTH ONCE DAILY FOR CHOLESTEROL Patient taking differently: Take 10 mg by mouth daily.  04/07/18   [name], MD  fluconazole (DIFLUCAN) 200 MG tablet Take 1 tablet (200 mg total) by mouth daily for 7 days. 04/01/19 04/08/19  Isla Pence, MD  ibuprofen (ADVIL) 200 MG tablet Take 600 mg by mouth every 6 (six) hours as needed for headache or mild pain.    [provider]  isosorbide mononitrate (IMDUR) 30 MG 24 hr tablet Take 30 mg by mouth daily.    [provider]  lidocaine (XYLOCAINE) 2 % solution 5 ml swish and swallow or spit for mouth pain/pain with swallowing. May add to Magic Mouthwash 02/26/19   Tanner, Lyndon Code., PA-C  lidocaine (XYLOCAINE) 2 % solution Use as directed 15 mLs in the mouth or throat every 6 (six) hours as needed for mouth pain. 04/01/19   Isla Pence, MD  lidocaine-prilocaine (EMLA) cream Apply 1 application topically as needed. 02/19/19   Heilingoetter, Cassandra L, PA-C  magic mouthwash SOLN 5 ml swish and spit QID prn mouth pain 02/27/19   Tanner, Lyndon Code., PA-C  nitroGLYCERIN (NITROSTAT) 0.4 MG SL tablet Place 1 tablet (0.4 mg total) under the tongue every 5 (five) minutes as needed for chest pain. 01/23/19 04/23/19  Patwardhan, Reynold Bowen, MD  ondansetron (ZOFRAN) 8 MG tablet Take 1 tablet (8 mg total) by mouth every 12 (twelve) hours as needed for nausea or  vomiting. 03/23/19   Kyung Rudd, MD  oxyCODONE (OXY IR/ROXICODONE) 5 MG immediate release tablet Take 1 tablet (5 mg total) by mouth every 4 (four) hours as needed for moderate pain or breakthrough pain. 03/05/19   Curt Bears, MD  prochlorperazine (COMPAZINE) 10 MG tablet TAKE ONE TABLET BY MOUTH EVERY 6 HOURS AS NEEDED FOR NAUSEA AND VOMITING 03/19/19   Curt Bears, MD  sucralfate (CARAFATE) 1 g tablet Take 1 tablet (1 g total) by mouth 4 (four) times daily. 03/09/19   Kyung Rudd, MD    Family History Family History  Problem Relation Age of Onset  . Hyperlipidemia Mother   .  Heart disease Mother        7 way bypass/ value replacement/pacemaker  . Heart attack Mother 3  . Diabetes Mother   . Cancer - Other Mother        lung  . Hypertension Father   . Heart attack Father 40  . Cancer - Other Father        throat    Social History Social History   Tobacco Use  . Smoking status: Current Every Day Smoker    Packs/day: 1.00  . Smokeless tobacco: Never Used  . Tobacco comment: smokes 2 cigarettes per day  Substance Use Topics  . Alcohol use: No  . Drug use: Never     Allergies   Dilaudid [hydromorphone hcl], Fentanyl, Penicillins, and Crestor [rosuvastatin]   Review of Systems Review of Systems  Constitutional: Positive for fatigue.  Respiratory: Positive for cough.   Gastrointestinal: Positive for nausea.  Neurological: Positive for weakness.  All other systems reviewed and are negative.    Physical Exam Updated Vital Signs BP 96/72 (BP Location: Left Arm)   Pulse 100   Temp 98.4 F (36.9 C) (Oral)   Resp 18   Ht 5\' 1"  (1.549 m)   Wt 44.5 kg   SpO2 93%   BMI 18.52 kg/m   Physical Exam Vitals signs and nursing note reviewed.  Constitutional:      Appearance: Normal appearance. She is cachectic.  HENT:     Head: Normocephalic and atraumatic.     Comments: Oral thrush    Right Ear: External ear normal.     Left Ear: External ear normal.      Nose: Nose normal.     Mouth/Throat:     Mouth: Mucous membranes are dry.  Eyes:     Extraocular Movements: Extraocular movements intact.     Conjunctiva/sclera: Conjunctivae normal.     Pupils: Pupils are equal, round, and reactive to light.  Neck:     Musculoskeletal: Normal range of motion.  Cardiovascular:     Rate and Rhythm: Normal rate and regular rhythm.     Pulses: Normal pulses.     Heart sounds: Normal heart sounds.  Pulmonary:     Effort: Pulmonary effort is normal.     Breath sounds: Normal breath sounds.  Abdominal:     General: Abdomen is flat. Bowel sounds are normal.     Palpations: Abdomen is soft.  Musculoskeletal: Normal range of motion.  Skin:    General: Skin is warm and dry.     Capillary Refill: Capillary refill takes 2 to 3 seconds.  Neurological:     General: No focal deficit present.     Mental Status: She is alert and oriented to person, place, and time.  Psychiatric:        Mood and Affect: Mood normal.        Behavior: Behavior normal.        Thought Content: Thought content normal.        Judgment: Judgment normal.      ED Treatments / Results  Labs (all labs ordered are listed, but only abnormal results are displayed) Labs Reviewed  BASIC METABOLIC PANEL - Abnormal; Notable for the following components:      Result Value   Sodium 131 (*)    Potassium 3.3 (*)    Chloride 91 (*)    Glucose, Bld 112 (*)    All other components within normal limits  CULTURE, BLOOD (ROUTINE X 2)  CULTURE,  BLOOD (ROUTINE X 2)  SARS CORONAVIRUS 2 (TAT 6-24 HRS)  BRAIN NATRIURETIC PEPTIDE  LACTIC ACID, PLASMA  LACTIC ACID, PLASMA  URINALYSIS, ROUTINE W REFLEX MICROSCOPIC  TROPONIN I (HIGH SENSITIVITY)  TROPONIN I (HIGH SENSITIVITY)    EKG EKG Interpretation  Date/Time:  Sunday April 01 2019 09:27:48 EST Ventricular Rate:  96 PR Interval:    QRS Duration: 75 QT Interval:  312 QTC Calculation: 395 R Axis:   59 Text Interpretation: Sinus  rhythm Borderline repolarization abnormality t wave inversions inf/lat.  new from prior. Confirmed by Alee Gressman ([id number]) on 04/01/2019 10:34:35 AM   Radiology Dg Chest Port 1 View  Result Date: 04/01/2019 CLINICAL DATA:  Weakness and dehydration.  History of lung carcinoma EXAM: PORTABLE CHEST 1 VIEW COMPARISON:  Chest CT March 30, 2019 and chest radiograph February 19, 2019. FINDINGS: Underlying emphysematous change noted. Nodular lesion in the periphery of the right upper lobe measuring 1.5 x 1.7 cm is noted, better seen on recent CT. There are scattered areas of fibrosis. No edema or consolidation evident. Heart size and pulmonary vascularity are normal. No adenopathy. Port-A-Cath tip is in the superior vena cava. There is aortic atherosclerosis. No evident bone lesions. IMPRESSION: Underlying emphysematous change in fibrosis. Nodular opacity in the periphery of the right upper lobe again noted. No edema or consolidation. Stable cardiac silhouette. No adenopathy. Port-A-Cath tip in superior vena cava. Aortic Atherosclerosis (ICD10-I70.0). Electronically Signed   By: William  Woodruff III M.D.   On: 04/01/2019 09:49    Procedures Procedures (including critical care time)  Medications Ordered in ED Medications  fluconazole (DIFLUCAN) tablet 200 mg (has no administration in time range)  sodium chloride 0.9 % bolus 1,000 mL (0 mLs Intravenous Stopped 04/01/19 1307)  ondansetron (ZOFRAN) injection 4 mg (4 mg Intravenous Given 04/01/19 1005)  lidocaine (XYLOCAINE) 2 % viscous mouth solution 15 mL (15 mLs Mouth/Throat Given 04/01/19 1005)  sodium chloride 0.9 % bolus 1,000 mL (1,000 mLs Intravenous New Bag/Given 04/01/19 1307)     Initial Impression / Assessment and Plan / ED Course  I have reviewed the triage vital signs and the nursing notes.  Pertinent labs & imaging results that were available during my care of the patient were reviewed by me and considered in my medical decision  making (see chart for details).    Pt is feeling much better after 2L of NS.  The pt is offered admission, but does not want to stay.  She has an appt tomorrow with oncology.  She knows to return if worse.    Final Clinical Impressions(s) / ED Diagnoses   Final diagnoses:  Weakness  Dehydration  Small cell lung cancer (HCC)  Oral thrush    ED Discharge Orders         Ordered    fluconazole (DIFLUCAN) 200 MG tablet  Daily     04/01/19 1455    lidocaine (XYLOCAINE) 2 % solution  Every 6 hours PRN     11 /29/20 1455           Isla Pence, MD 04/01/19 1457

## 2019-04-01 NOTE — ED Triage Notes (Signed)
Pt brought in for generalized weakness and possible dehydration. Has not been eating or drinking much. Last Chemo last Wednesday.

## 2019-04-02 ENCOUNTER — Encounter: Payer: Self-pay | Admitting: Radiation Oncology

## 2019-04-02 ENCOUNTER — Other Ambulatory Visit: Payer: Self-pay

## 2019-04-02 ENCOUNTER — Inpatient Hospital Stay (HOSPITAL_COMMUNITY)
Admission: EM | Admit: 2019-04-02 | Discharge: 2019-05-04 | DRG: 871 | Disposition: E | Payer: Medicare HMO | Attending: Internal Medicine | Admitting: Internal Medicine

## 2019-04-02 ENCOUNTER — Telehealth: Payer: Self-pay | Admitting: Medical Oncology

## 2019-04-02 ENCOUNTER — Emergency Department (HOSPITAL_COMMUNITY): Payer: Medicare HMO

## 2019-04-02 ENCOUNTER — Other Ambulatory Visit (HOSPITAL_COMMUNITY): Payer: Self-pay

## 2019-04-02 ENCOUNTER — Ambulatory Visit
Admission: RE | Admit: 2019-04-02 | Discharge: 2019-04-02 | Disposition: A | Discharge status: Home / Self Care | Payer: Medicare HMO | Source: Ambulatory Visit | Attending: Radiation Oncology | Admitting: Radiation Oncology

## 2019-04-02 ENCOUNTER — Encounter (HOSPITAL_COMMUNITY): Payer: Self-pay

## 2019-04-02 ENCOUNTER — Inpatient Hospital Stay: Payer: Medicare HMO

## 2019-04-02 ENCOUNTER — Ambulatory Visit: Payer: Medicare HMO

## 2019-04-02 DIAGNOSIS — I10 Essential (primary) hypertension: Secondary | ICD-10-CM

## 2019-04-02 DIAGNOSIS — D61810 Antineoplastic chemotherapy induced pancytopenia: Secondary | ICD-10-CM | POA: Diagnosis present

## 2019-04-02 DIAGNOSIS — E8770 Fluid overload, unspecified: Secondary | ICD-10-CM

## 2019-04-02 DIAGNOSIS — R5081 Fever presenting with conditions classified elsewhere: Secondary | ICD-10-CM | POA: Diagnosis present

## 2019-04-02 DIAGNOSIS — B370 Candidal stomatitis: Secondary | ICD-10-CM | POA: Diagnosis not present

## 2019-04-02 DIAGNOSIS — R627 Adult failure to thrive: Secondary | ICD-10-CM | POA: Diagnosis not present

## 2019-04-02 DIAGNOSIS — A419 Sepsis, unspecified organism: Secondary | ICD-10-CM | POA: Diagnosis not present

## 2019-04-02 DIAGNOSIS — J449 Chronic obstructive pulmonary disease, unspecified: Secondary | ICD-10-CM | POA: Diagnosis not present

## 2019-04-02 DIAGNOSIS — M8580 Other specified disorders of bone density and structure, unspecified site: Secondary | ICD-10-CM | POA: Diagnosis present

## 2019-04-02 DIAGNOSIS — Z66 Do not resuscitate: Secondary | ICD-10-CM | POA: Diagnosis present

## 2019-04-02 DIAGNOSIS — K1230 Oral mucositis (ulcerative), unspecified: Secondary | ICD-10-CM | POA: Diagnosis present

## 2019-04-02 DIAGNOSIS — Z681 Body mass index (BMI) 19 or less, adult: Secondary | ICD-10-CM

## 2019-04-02 DIAGNOSIS — Z833 Family history of diabetes mellitus: Secondary | ICD-10-CM

## 2019-04-02 DIAGNOSIS — C3411 Malignant neoplasm of upper lobe, right bronchus or lung: Secondary | ICD-10-CM

## 2019-04-02 DIAGNOSIS — Z8249 Family history of ischemic heart disease and other diseases of the circulatory system: Secondary | ICD-10-CM

## 2019-04-02 DIAGNOSIS — Z885 Allergy status to narcotic agent status: Secondary | ICD-10-CM

## 2019-04-02 DIAGNOSIS — J9601 Acute respiratory failure with hypoxia: Secondary | ICD-10-CM | POA: Diagnosis not present

## 2019-04-02 DIAGNOSIS — F17210 Nicotine dependence, cigarettes, uncomplicated: Secondary | ICD-10-CM | POA: Diagnosis present

## 2019-04-02 DIAGNOSIS — Z8349 Family history of other endocrine, nutritional and metabolic diseases: Secondary | ICD-10-CM

## 2019-04-02 DIAGNOSIS — E876 Hypokalemia: Secondary | ICD-10-CM | POA: Diagnosis present

## 2019-04-02 DIAGNOSIS — R7303 Prediabetes: Secondary | ICD-10-CM | POA: Diagnosis present

## 2019-04-02 DIAGNOSIS — E43 Unspecified severe protein-calorie malnutrition: Secondary | ICD-10-CM | POA: Diagnosis present

## 2019-04-02 DIAGNOSIS — E860 Dehydration: Secondary | ICD-10-CM

## 2019-04-02 DIAGNOSIS — C3481 Malignant neoplasm of overlapping sites of right bronchus and lung: Secondary | ICD-10-CM

## 2019-04-02 DIAGNOSIS — R110 Nausea: Secondary | ICD-10-CM | POA: Diagnosis not present

## 2019-04-02 DIAGNOSIS — D6481 Anemia due to antineoplastic chemotherapy: Secondary | ICD-10-CM

## 2019-04-02 DIAGNOSIS — Z20828 Contact with and (suspected) exposure to other viral communicable diseases: Secondary | ICD-10-CM | POA: Diagnosis present

## 2019-04-02 DIAGNOSIS — Z515 Encounter for palliative care: Secondary | ICD-10-CM | POA: Diagnosis not present

## 2019-04-02 DIAGNOSIS — C3490 Malignant neoplasm of unspecified part of unspecified bronchus or lung: Secondary | ICD-10-CM

## 2019-04-02 DIAGNOSIS — R0602 Shortness of breath: Secondary | ICD-10-CM | POA: Diagnosis not present

## 2019-04-02 DIAGNOSIS — Z7982 Long term (current) use of aspirin: Secondary | ICD-10-CM

## 2019-04-02 DIAGNOSIS — R64 Cachexia: Secondary | ICD-10-CM | POA: Diagnosis present

## 2019-04-02 DIAGNOSIS — R079 Chest pain, unspecified: Secondary | ICD-10-CM | POA: Diagnosis not present

## 2019-04-02 DIAGNOSIS — F419 Anxiety disorder, unspecified: Secondary | ICD-10-CM | POA: Diagnosis present

## 2019-04-02 DIAGNOSIS — M1990 Unspecified osteoarthritis, unspecified site: Secondary | ICD-10-CM | POA: Diagnosis present

## 2019-04-02 DIAGNOSIS — Z888 Allergy status to other drugs, medicaments and biological substances status: Secondary | ICD-10-CM

## 2019-04-02 DIAGNOSIS — E782 Mixed hyperlipidemia: Secondary | ICD-10-CM | POA: Diagnosis not present

## 2019-04-02 DIAGNOSIS — D709 Neutropenia, unspecified: Secondary | ICD-10-CM | POA: Diagnosis present

## 2019-04-02 DIAGNOSIS — I25110 Atherosclerotic heart disease of native coronary artery with unstable angina pectoris: Secondary | ICD-10-CM | POA: Diagnosis present

## 2019-04-02 DIAGNOSIS — T451X5A Adverse effect of antineoplastic and immunosuppressive drugs, initial encounter: Secondary | ICD-10-CM | POA: Diagnosis present

## 2019-04-02 DIAGNOSIS — I4891 Unspecified atrial fibrillation: Secondary | ICD-10-CM | POA: Diagnosis not present

## 2019-04-02 DIAGNOSIS — Z884 Allergy status to anesthetic agent status: Secondary | ICD-10-CM

## 2019-04-02 DIAGNOSIS — J439 Emphysema, unspecified: Secondary | ICD-10-CM | POA: Diagnosis present

## 2019-04-02 DIAGNOSIS — Z720 Tobacco use: Secondary | ICD-10-CM

## 2019-04-02 DIAGNOSIS — Z95828 Presence of other vascular implants and grafts: Secondary | ICD-10-CM

## 2019-04-02 DIAGNOSIS — Z880 Allergy status to penicillin: Secondary | ICD-10-CM

## 2019-04-02 LAB — CBC WITH DIFFERENTIAL (CANCER CENTER ONLY)
HCT: 22.6 % — ABNORMAL LOW (ref 36.0–46.0)
Hemoglobin: 7.8 g/dL — ABNORMAL LOW (ref 12.0–15.0)
MCH: 32.8 pg (ref 26.0–34.0)
MCHC: 34.5 g/dL (ref 30.0–36.0)
MCV: 95 fL (ref 80.0–100.0)
Platelet Count: 40 10*3/uL — ABNORMAL LOW (ref 150–400)
RBC: 2.38 MIL/uL — ABNORMAL LOW (ref 3.87–5.11)
RDW: 13.2 % (ref 11.5–15.5)
WBC Count: 0.1 10*3/uL — CL (ref 4.0–10.5)
nRBC: 0 % (ref 0.0–0.2)

## 2019-04-02 LAB — CBC WITH DIFFERENTIAL/PLATELET
Abs Immature Granulocytes: 0.01 10*3/uL (ref 0.00–0.07)
Basophils Absolute: 0 10*3/uL (ref 0.0–0.1)
Basophils Relative: 0 %
Eosinophils Absolute: 0 10*3/uL (ref 0.0–0.5)
Eosinophils Relative: 0 %
HCT: 22.5 % — ABNORMAL LOW (ref 36.0–46.0)
Hemoglobin: 7.7 g/dL — ABNORMAL LOW (ref 12.0–15.0)
Immature Granulocytes: 14 %
Lymphocytes Relative: 43 %
Lymphs Abs: 0 10*3/uL — ABNORMAL LOW (ref 0.7–4.0)
MCH: 32.9 pg (ref 26.0–34.0)
MCHC: 34.2 g/dL (ref 30.0–36.0)
MCV: 96.2 fL (ref 80.0–100.0)
Monocytes Absolute: 0 10*3/uL — ABNORMAL LOW (ref 0.1–1.0)
Monocytes Relative: 0 %
Neutro Abs: 0 10*3/uL — ABNORMAL LOW (ref 1.7–7.7)
Neutrophils Relative %: 43 %
Platelets: 36 10*3/uL — ABNORMAL LOW (ref 150–400)
RBC: 2.34 MIL/uL — ABNORMAL LOW (ref 3.87–5.11)
RDW: 13.2 % (ref 11.5–15.5)
WBC: 0.1 10*3/uL — CL (ref 4.0–10.5)
nRBC: 0 % (ref 0.0–0.2)

## 2019-04-02 LAB — CMP (CANCER CENTER ONLY)
ALT: 11 U/L (ref 0–44)
AST: 12 U/L — ABNORMAL LOW (ref 15–41)
Albumin: 3 g/dL — ABNORMAL LOW (ref 3.5–5.0)
Alkaline Phosphatase: 60 U/L (ref 38–126)
Anion gap: 11 (ref 5–15)
BUN: 9 mg/dL (ref 8–23)
CO2: 26 mmol/L (ref 22–32)
Calcium: 8.7 mg/dL — ABNORMAL LOW (ref 8.9–10.3)
Chloride: 95 mmol/L — ABNORMAL LOW (ref 98–111)
Creatinine: 0.66 mg/dL (ref 0.44–1.00)
GFR, Est AFR Am: >60 mL/min (ref >60)
GFR, Estimated: >60 mL/min (ref >60)
Glucose, Bld: 120 mg/dL — ABNORMAL HIGH (ref 70–99)
Potassium: 3.5 mmol/L (ref 3.5–5.1)
Sodium: 132 mmol/L — ABNORMAL LOW (ref 135–145)
Total Bilirubin: 0.8 mg/dL (ref 0.3–1.2)
Total Protein: 6.4 g/dL — ABNORMAL LOW (ref 6.5–8.1)

## 2019-04-02 LAB — COMPREHENSIVE METABOLIC PANEL
ALT: 14 U/L (ref 0–44)
AST: 13 U/L — ABNORMAL LOW (ref 15–41)
Albumin: 3.2 g/dL — ABNORMAL LOW (ref 3.5–5.0)
Alkaline Phosphatase: 53 U/L (ref 38–126)
Anion gap: 12 (ref 5–15)
BUN: 11 mg/dL (ref 8–23)
CO2: 25 mmol/L (ref 22–32)
Calcium: 8.6 mg/dL — ABNORMAL LOW (ref 8.9–10.3)
Chloride: 95 mmol/L — ABNORMAL LOW (ref 98–111)
Creatinine, Ser: 0.6 mg/dL (ref 0.44–1.00)
GFR calc Af Amer: >60 mL/min (ref >60)
GFR calc non Af Amer: >60 mL/min (ref >60)
Glucose, Bld: 122 mg/dL — ABNORMAL HIGH (ref 70–99)
Potassium: 3.3 mmol/L — ABNORMAL LOW (ref 3.5–5.1)
Sodium: 132 mmol/L — ABNORMAL LOW (ref 135–145)
Total Bilirubin: 1.1 mg/dL (ref 0.3–1.2)
Total Protein: 6.8 g/dL (ref 6.5–8.1)

## 2019-04-02 LAB — PROCALCITONIN: Procalcitonin: <0.1 ng/mL

## 2019-04-02 LAB — APTT: aPTT: 41 seconds — ABNORMAL HIGH (ref 24–36)

## 2019-04-02 LAB — PROTIME-INR
INR: 1.2 (ref 0.8–1.2)
Prothrombin Time: 14.7 seconds (ref 11.4–15.2)

## 2019-04-02 LAB — LACTIC ACID, PLASMA
Lactic Acid, Venous: 1.3 mmol/L (ref 0.5–1.9)
Lactic Acid, Venous: 1.1 mmol/L (ref 0.5–1.9)

## 2019-04-02 LAB — POC SARS CORONAVIRUS 2 AG -  ED: SARS Coronavirus 2 Ag: NEGATIVE

## 2019-04-02 LAB — SARS CORONAVIRUS 2 (TAT 6-24 HRS): SARS Coronavirus 2: NEGATIVE

## 2019-04-02 MED ORDER — FLUCONAZOLE IN SODIUM CHLORIDE 400-0.9 MG/200ML-% IV SOLN
400.0000 mg | Freq: Once | INTRAVENOUS | Status: AC
  Administered 2019-04-03: 400 mg via INTRAVENOUS
  Filled 2019-04-02: qty 200

## 2019-04-02 MED ORDER — FLUCONAZOLE IN SODIUM CHLORIDE 200-0.9 MG/100ML-% IV SOLN
200.0000 mg | INTRAVENOUS | Status: DC
  Administered 2019-04-03: 21:00:00 200 mg via INTRAVENOUS
  Filled 2019-04-02 (×2): qty 100

## 2019-04-02 MED ORDER — HEPARIN SOD (PORK) LOCK FLUSH 100 UNIT/ML IV SOLN
500.0000 [IU] | Freq: Once | INTRAVENOUS | Status: AC
  Administered 2019-04-02: 500 [IU] via INTRAVENOUS
  Filled 2019-04-02: qty 5

## 2019-04-02 MED ORDER — ACETAMINOPHEN 500 MG PO TABS: 1000.0000 mg | ORAL_TABLET | Freq: Once | ORAL | Status: DC

## 2019-04-02 MED ORDER — MORPHINE SULFATE (PF) 2 MG/ML IV SOLN
2.0000 mg | INTRAVENOUS | Status: DC | PRN
  Administered 2019-04-02 – 2019-04-04 (×7): 2 mg via INTRAVENOUS
  Filled 2019-04-02 (×7): qty 1

## 2019-04-02 MED ORDER — MAGIC MOUTHWASH W/LIDOCAINE
5.0000 mL | Freq: Three times a day (TID) | ORAL | Status: DC | PRN
  Administered 2019-04-02 – 2019-04-04 (×4): 5 mL via ORAL
  Filled 2019-04-02 (×7): qty 5

## 2019-04-02 MED ORDER — SODIUM CHLORIDE 0.9 % IV BOLUS
1000.0000 mL | Freq: Once | INTRAVENOUS | Status: AC
  Administered 2019-04-03: 1000 mL via INTRAVENOUS

## 2019-04-02 MED ORDER — ACETAMINOPHEN 160 MG/5ML PO SOLN
1000.0000 mg | Freq: Once | ORAL | Status: AC
  Administered 2019-04-02: 1000 mg via ORAL
  Filled 2019-04-02: qty 40.6

## 2019-04-02 MED ORDER — METRONIDAZOLE IN NACL 5-0.79 MG/ML-% IV SOLN
500.0000 mg | Freq: Three times a day (TID) | INTRAVENOUS | Status: DC
  Administered 2019-04-02 – 2019-04-03 (×2): 500 mg via INTRAVENOUS
  Filled 2019-04-02 (×2): qty 100

## 2019-04-02 MED ORDER — VANCOMYCIN HCL IN DEXTROSE 1-5 GM/200ML-% IV SOLN
1000.0000 mg | INTRAVENOUS | Status: AC
  Administered 2019-04-02: 1000 mg via INTRAVENOUS
  Filled 2019-04-02: qty 200

## 2019-04-02 MED ORDER — ONDANSETRON HCL 4 MG/2ML IJ SOLN
4.0000 mg | Freq: Once | INTRAMUSCULAR | Status: AC
  Administered 2019-04-02: 4 mg via INTRAVENOUS
  Filled 2019-04-02: qty 2

## 2019-04-02 MED ORDER — SODIUM CHLORIDE 0.9 % IV BOLUS
1000.0000 mL | Freq: Once | INTRAVENOUS | Status: AC
  Administered 2019-04-02: 18:00:00 1000 mL via INTRAVENOUS

## 2019-04-02 MED ORDER — SODIUM CHLORIDE 0.9 % IV BOLUS
1000.0000 mL | Freq: Once | INTRAVENOUS | Status: AC
  Administered 2019-04-02: 1000 mL via INTRAVENOUS

## 2019-04-02 MED ORDER — SODIUM CHLORIDE 0.9 % IV SOLN
2.0000 g | Freq: Once | INTRAVENOUS | Status: AC
  Administered 2019-04-02: 2 g via INTRAVENOUS
  Filled 2019-04-02: qty 2

## 2019-04-02 MED ORDER — SODIUM CHLORIDE 0.9% FLUSH
10.0000 mL | INTRAVENOUS | Status: DC | PRN
  Administered 2019-04-02: 10 mL via INTRAVENOUS
  Filled 2019-04-02: qty 10

## 2019-04-02 NOTE — ED Triage Notes (Signed)
Patient states, "I just want to be admitted." Patient states," I was at [facility] yesterday and I received 2 bags of NS and Diflucan." Patient states they asked me if I could stay, but I wanted to do my radiation.  Patient c/o emesis, diarrhea, and thrush of her mouth.

## 2019-04-02 NOTE — H&P (Signed)
Stacy [name] JTT:[id number] DOB: 1946-02-22 DOA: 03/16/2019    PCP: [name], MD   Outpatient Specialists:   Eustaquio Boyden   Oncology   Dr. Julien Nordmann    Patient arrived to ER on 03/18/2019 at 1523  Patient coming from: home Lives alone,    But   Family visits alot    Chief Complaint:   Chief Complaint  Patient presents with  . cancer patient  . Hematemesis  . Diarrhea  . Thrush    HPI: Stacy [name] is a 73 y.o. female with medical history significant of lung cancer, COPD, HLD, HTN    Presented with  Unable to eat has sever thrush Now febrile  WNC 0.1 She had blood cultures done yesterday and today Last Chemo was 11/25 with etoposide and received Neulasta   she have had sever thrush it hurts to swallow diminished PO intake Was seen at [facility] yesterday But did not want to be admitted at the time bc she was due for radiation treatment today  today developed a fever Her BP has been low she has tried to decrease her Atenolol dose to half Reports cough but it is chronic for her   Infectious risk factors:  Reports fever, cough    In  ER RAPID COVID TEST  NEGATIVE   in house  PCR testing  Pending  Lab Results  Component Value Date   [place] NEGATIVE 04/01/2019   [place] NOT DETECTED 02/16/2019   [place] NOT DETECTED 01/22/2019     Regarding pertinent Chronic problems:   Lung Cancer   Hyperlipidemia -  on statins Zetia   HTN on atenolol , Imdur    COPD denies hx of Reports she stopped smoking recently    While in ER: vanc and aztreonam   ER Provider Called:   Oncology  Dr.Kale  They Recommend admit to medicine for IV ABX and IVF  No need for neutrophil stimulating factors    I will email Dr. Earlie Server the patient has been admitted for him to see pneumonia   The following Work up has been ordered so far:  Orders Placed This Encounter  Procedures  . Culture, blood (Routine x 2)  . DG Chest 2 View  . Comprehensive metabolic  panel  . Lactic acid, plasma  . CBC with Differential  . Protime-INR  . Urinalysis, Routine w reflex microscopic  . Notify Physician if pt is possible Sepsis patient  . Document height and weight  . Insert / maintain saline lock  . Consult to hospitalist  ALL PATIENTS BEING ADMITTED/HAVING PROCEDURES NEED COVID-19 SCREENING  . POC SARS Coronavirus 2 Ag-ED - Nasal Swab (BD Veritor Kit)    Following Medications were ordered in ER: Medications  aztreonam (AZACTAM) 2 g in sodium chloride 0.9 % 100 mL IVPB (has no administration in time range)  sodium chloride 0.9 % bolus 1,000 mL (1,000 mLs Intravenous New Bag/Given 03/05/2019 1805)  ondansetron (ZOFRAN) injection 4 mg (4 mg Intravenous Given 03/04/2019 1805)  acetaminophen (TYLENOL) 160 MG/5ML solution 1,000 mg (1,000 mg Oral Given 03/08/2019 1722)  vancomycin (VANCOCIN) IVPB 1000 mg/200 mL premix (1,000 mg Intravenous New Bag/Given 03/10/2019 1805)        Consult Orders  (From admission, onward)         Start     Ordered   03/29/2019 1941  Consult to hospitalist  ALL PATIENTS BEING ADMITTED/HAVING PROCEDURES NEED COVID-19 SCREENING  Once    Comments: ALL PATIENTS BEING ADMITTED/HAVING  PROCEDURES NEED COVID-19 SCREENING  Provider:  (Not yet assigned)  Question Answer Comment  Place call to: [facility]   Reason for Consult Admit      03/08/2019 1940          Significant initial  Findings: Abnormal Labs Reviewed  COMPREHENSIVE METABOLIC PANEL - Abnormal; Notable for the following components:      Result Value   Sodium 132 (*)    Potassium 3.3 (*)    Chloride 95 (*)    Glucose, Bld 122 (*)    Calcium 8.6 (*)    Albumin 3.2 (*)    AST 13 (*)    All other components within normal limits  CBC WITH DIFFERENTIAL/PLATELET - Abnormal; Notable for the following components:   WBC 0.1 (*)    RBC 2.34 (*)    Hemoglobin 7.7 (*)    HCT 22.5 (*)    Platelets 36 (*)    Neutro Abs 0.0 (*)    Lymphs Abs 0.0 (*)    Monocytes Absolute  0.0 (*)    All other components within normal limits     Otherwise labs showing:    Recent Labs  Lab 04/01/19 0952 03/05/2019 1452 03/26/2019 1640  NA 131* 132* 132*  K 3.3* 3.5 3.3*  CO2 27 26 25   GLUCOSE 112* 120* 122*  BUN 10 9 11   CREATININE 0.62 0.66 0.60  CALCIUM 9.1 8.7* 8.6*    Cr   stable,   Lab Results  Component Value Date   CREATININE 0.60 03/29/2019   CREATININE 0.66 03/13/2019   CREATININE 0.62 04/01/2019    Recent Labs  Lab 03/05/2019 1452 03/17/2019 1640  AST 12* 13*  ALT 11 14  ALKPHOS 60 53  BILITOT 0.8 1.1  PROT 6.4* 6.8  ALBUMIN 3.0* 3.2*   Lab Results  Component Value Date   CALCIUM 8.6 (L) 03/28/2019      WBC      Component Value Date/Time   WBC 0.1 (LL) 03/31/2019 1640   ANC    Component Value Date/Time   NEUTROABS 0.0 (L) 03/22/2019 1640   ALC No components found for: LYMPHAB    Plt: Lab Results  Component Value Date   PLT 36 (L) 03/21/2019     Lactic Acid, Venous    Component Value Date/Time   LATICACIDVEN 1.1 04/01/2019 1640    Procalcitonin <0.1   COVID-19 Labs  No results for input(s): DDIMER, FERRITIN, LDH, CRP in the last 72 hours.  Lab Results  Component Value Date   SARSCOV2NAA NEGATIVE 04/01/2019   [place] NOT DETECTED 02/16/2019   SARSCOV2NAA NOT DETECTED 01/22/2019      HG/HCT  stable,      Component Value Date/Time   HGB 7.7 (L) 03/09/2019 1640   HGB 7.8 (L) 03/28/2019 1452   HCT 22.5 (L) 03/21/2019 1640     ECG: Ordered Personally reviewed by me showing: HR : 115 Rhythm:  Sinus tachycardia     no evidence of ischemic changes QTC 368   BNP (last 3 results) Recent Labs    04/01/19 0952  BNP 77.0    ProBNP (last 3 results) No results for input(s): PROBNP in the last 8760 hours.  DM  labs:  HbA1C: Recent Labs    08/24/18 1407 11/27/18 1522  HGBA1C 5.6 5.7*       CBG (last 3)  No results for input(s): GLUCAP in the last 72 hours.     UA  no evidence of UTI  Urine  analysis:    Component Value Date/Time   COLORURINE YELLOW 04/01/2019 0922   APPEARANCEUR CLEAR 04/01/2019 0922   LABSPEC 1.013 04/01/2019 0922   PHURINE 6.0 04/01/2019 0922   GLUCOSEU NEGATIVE 04/01/2019 0922   HGBUR NEGATIVE 04/01/2019 0922   BILIRUBINUR NEGATIVE 04/01/2019 0922   KETONESUR 80 (A) 04/01/2019 0922   PROTEINUR NEGATIVE 04/01/2019 0922   UROBILINOGEN 0.2 11/11/2014 1503   NITRITE NEGATIVE 04/01/2019 0922   LEUKOCYTESUR NEGATIVE 04/01/2019 0922       Ordered   CXR -  NON acute   CT  chest w contrast from 11/27 - nonacute,  no evidence of infiltrate Showed interval improvement of known nodules      ED Triage Vitals  Enc Vitals Group     BP 03/06/2019 1542 112/70     Pulse Rate 03/06/2019 1542 (!) 116     Resp 03/09/2019 1542 18     Temp 03/27/2019 1542 (!) 101.1 F (38.4 C)     Temp Source 03/18/2019 1542 Oral     SpO2 03/27/2019 1542 96 %     Weight 03/13/2019 1542 105 lb (47.6 kg)     Height 03/29/2019 1542 5' 1.5" (1.562 m)     Head Circumference --      Peak Flow --      Pain Score 04/01/2019 1545 9     Pain Loc --      Pain Edu? --      Excl. in [place]? --   TMAX(24)@       Latest  Blood pressure 99/67, pulse 100, temperature 98.9 F (37.2 C), temperature source Oral, resp. rate 20, height 5' 1.5" (1.562 m), weight 47.6 kg, SpO2 99 %.     Hospitalist was called for admission for neutropenic fever   Review of Systems:    Pertinent positives include: Fevers, chills,  shortness of breath at rest.productive cough, Constitutional:  No weight loss, night sweats,  fatigue, weight loss  HEENT:  No headaches, Difficulty swallowing,Tooth/dental problems,Sore throat,  No sneezing, itching, ear ache, nasal congestion, post nasal drip,  Cardio-vascular:  No chest pain, Orthopnea, PND, anasarca, dizziness, palpitations.no Bilateral lower extremity swelling  GI:  No heartburn, indigestion, abdominal pain, nausea, vomiting, diarrhea, change in bowel habits, loss of  appetite, melena, blood in stool, hematemesis Resp:  no No dyspnea on exertion, No excess mucus, no  No non-productive cough, No coughing up of blood.No change in color of mucus.No wheezing. Skin:  no rash or lesions. No jaundice GU:  no dysuria, change in color of urine, no urgency or frequency. No straining to urinate.  No flank pain.  Musculoskeletal:  No joint pain or no joint swelling. No decreased range of motion. No back pain.  Psych:  No change in mood or affect. No depression or anxiety. No memory loss.  Neuro: no localizing neurological complaints, no tingling, no weakness, no double vision, no gait abnormality, no slurred speech, no confusion  All systems reviewed and apart from [place] all are negative  Past Medical History:   Past Medical History:  Diagnosis Date  . Carotid bruit    Bilateral  . Chest pain    occasionally, takes nitro if needed, at rest (1-2 times a month)  . Colon polyps    with divertirticulitis  . COPD (chronic obstructive pulmonary disease) ([place])   . DJD (degenerative joint disease)   . Emphysema lung ([place])   . Family history of adverse reaction to anesthesia    mom  PONV  . Hyperlipidemia   . Hypertension   . Lung cancer ([place])   . Lymph node cancer ([place])   . Osteopenia   . Prediabetes   . Vitamin D deficiency       Past Surgical History:  Procedure Laterality Date  . CATARACT EXTRACTION Bilateral 03/17/2017  . COLONOSCOPY    . ESOPHAGOGASTRODUODENOSCOPY    . HAND SURGERY    . head injury  1995  . LAMINECTOMY  2002   L 5   . NECK SURGERY  2012    c4 -5/6  fushion  . PORTACATH PLACEMENT Left 02/19/2019   Procedure: INSERTION PORT-A-CATH;  Surgeon: Melrose Nakayama, MD;  Location: [facility];  Service: Thoracic;  Laterality: Left;  placed left subclavian  . TONSILLECTOMY    . TRIGGER FINGER RELEASE    . VIDEO BRONCHOSCOPY WITH ENDOBRONCHIAL ULTRASOUND N/A 01/25/2019   Procedure: VIDEO BRONCHOSCOPY WITH ENDOBRONCHIAL ULTRASOUND;   Surgeon: Melrose Nakayama, MD;  Location: [facility];  Service: Thoracic;  Laterality: N/A;    Social History:  Ambulatory  independently     reports that she has been smoking. She has been smoking about 1.00 pack per day. She has never used smokeless tobacco. She reports that she does not drink alcohol or use drugs. Family History:  Family History  Problem Relation Age of Onset  . Hyperlipidemia Mother   . Heart disease Mother        7 way bypass/ value replacement/pacemaker  . Heart attack Mother 14  . Diabetes Mother   . Cancer - Other Mother        lung  . Hypertension Father   . Heart attack Father 77  . Cancer - Other Father        throat    Allergies: Allergies  Allergen Reactions  . Dilaudid [Hydromorphone Hcl] Shortness Of Breath and Swelling  . Fentanyl Shortness Of Breath and Swelling  . Penicillins Anaphylaxis    (Had skin test at [facility]) Did it involve swelling of the face/tongue/throat, SOB, or low BP? Yes  +++CARDIAC ARREST+++ Did it involve sudden or severe rash/hives, skin peeling, or any reaction on the inside of your mouth or nose? No Did you need to seek medical attention at a hospital or doctor's office? Yes When did it last happen? Many years ago If all above answers are "NO", may proceed with cephalosporin use.   . Crestor [Rosuvastatin] Other (See Comments)    Joint pain, cramping      Prior to Admission medications   Medication Sig Start Date End Date Taking? Authorizing Provider  albuterol (PROAIR HFA) 108 (90 Base) MCG/ACT inhaler Inhale 2 puffs into the lungs every 6 (six) hours as needed for wheezing or shortness of breath. 06/23/17  Yes Vicie Mutters, PA-C  ALPRAZolam (XANAX) 0.5 MG tablet TAKE 1/2 TO 1 (ONE-HALF TO ONE) TABLET BY MOUTH TWICE DAILY AS NEEDED FOR ANXIETY OR&nbsp;&nbsp;SLEEP Patient taking differently: Take 0.5-1 mg by mouth 2 (two) times daily as needed for anxiety or sleep.  01/22/19  Yes Vicie Mutters, PA-C   aspirin 81 MG tablet Take 162 mg by mouth daily.    Yes [provider]  atenolol (TENORMIN) 25 MG tablet Take 1 tablet Daily for BP Patient taking differently: Take 25 mg by mouth daily.  08/25/18  Yes [name], MD  cholecalciferol (VITAMIN D) 1000 units tablet Take 1,000 Units by mouth daily.    Yes [provider]  diphenhydramine-acetaminophen (TYLENOL PM) 25-500 MG TABS  tablet Take 1 tablet by mouth at bedtime as needed (sleep).    Yes [provider]  ezetimibe (ZETIA) 10 MG tablet TAKE 1 TABLET BY MOUTH ONCE DAILY FOR CHOLESTEROL Patient taking differently: Take 10 mg by mouth daily.  04/07/18  Yes [name], MD  fluconazole (DIFLUCAN) 200 MG tablet Take 1 tablet (200 mg total) by mouth daily for 7 days. 04/01/19 04/08/19 Yes Isla Pence, MD  ibuprofen (ADVIL) 200 MG tablet Take 600 mg by mouth every 6 (six) hours as needed for headache or mild pain.   Yes [provider]  isosorbide mononitrate (IMDUR) 30 MG 24 hr tablet Take 30 mg by mouth daily.   Yes [provider]  lidocaine (XYLOCAINE) 2 % solution Use as directed 15 mLs in the mouth or throat every 6 (six) hours as needed for mouth pain. 04/01/19  Yes Isla Pence, MD  lidocaine-prilocaine (EMLA) cream Apply 1 application topically as needed. 02/19/19  Yes Heilingoetter, Cassandra L, PA-C  nitroGLYCERIN (NITROSTAT) 0.4 MG SL tablet Place 1 tablet (0.4 mg total) under the tongue every 5 (five) minutes as needed for chest pain. 01/23/19 04/23/19 Yes Patwardhan, Manish J, MD  ondansetron (ZOFRAN) 8 MG tablet Take 1 tablet (8 mg total) by mouth every 12 (twelve) hours as needed for nausea or vomiting. 03/23/19  Yes Kyung Rudd, MD  oxyCODONE (OXY IR/ROXICODONE) 5 MG immediate release tablet Take 1 tablet (5 mg total) by mouth every 4 (four) hours as needed for moderate pain or breakthrough pain. 03/05/19  Yes Curt Bears, MD  prochlorperazine (COMPAZINE) 10 MG tablet TAKE  ONE TABLET BY MOUTH EVERY 6 HOURS AS NEEDED FOR NAUSEA AND VOMITING Patient taking differently: Take 10 mg by mouth every 6 (six) hours as needed for nausea or vomiting.  03/19/19  Yes Curt Bears, MD  sucralfate (CARAFATE) 1 g tablet Take 1 tablet (1 g total) by mouth 4 (four) times daily. 03/09/19  Yes Kyung Rudd, MD  dexamethasone (DECADRON) 4 MG tablet Take 1 tablet (4 mg total) by mouth 2 (two) times daily with a meal. Please take 1 tablet twice a day for 3-4 days after chemotherapy. Patient not taking: Reported on 03/22/2019 03/26/19   Heilingoetter, Cassandra L, PA-C  lidocaine (XYLOCAINE) 2 % solution 5 ml swish and swallow or spit for mouth pain/pain with swallowing. May add to [place] Patient not taking: Reported on 03/30/2019 02/26/19   Harle Stanford., PA-C  magic mouthwash SOLN 5 ml swish and spit QID prn mouth pain Patient not taking: Reported on 03/13/2019 02/27/19   Harle Stanford., PA-C  nystatin (MYCOSTATIN) 100000 UNIT/ML suspension Take 5 mLs by mouth 4 (four) times daily. 03/14/19   [provider]   Physical Exam: Blood pressure 99/67, pulse 100, temperature 98.9 F (37.2 C), temperature source Oral, resp. rate 20, height 5' 1.5" (1.562 m), weight 47.6 kg, SpO2 99 %. 1. General:  in No  Acute distress   Chronically ill cachectic-appearing 2. Psychological: Alert and   Oriented 3. Head/ENT:     Dry Mucous Membranes                          Head Non traumatic, neck supple                           Poor Dentition Thrush noted 4. SKIN:  decreased Skin turgor,  Skin clean Dry and intact no rash  5. Heart: Regular rate and rhythm no Murmur, no Rub or gallop 6. Lungs:   no wheezes or crackles   7. Abdomen: Soft,  non-tender, Non distended thin,  bowel sounds present 8. Lower extremities: no clubbing, cyanosis, no  edema 9. Neurologically Grossly intact, moving all 4 extremities equally   10. MSK: Normal range of motion   All other LABS:     Recent  Labs  Lab 03/08/2019 1452 03/14/2019 1640  WBC 0.1* 0.1*  NEUTROABS  --  0.0*  HGB 7.8* 7.7*  HCT 22.6* 22.5*  MCV 95.0 96.2  PLT 40* 36*     Recent Labs  Lab 04/01/19 0952 03/23/2019 1452 03/13/2019 1640  NA 131* 132* 132*  K 3.3* 3.5 3.3*  CL 91* 95* 95*  CO2 27 26 25   GLUCOSE 112* 120* 122*  BUN 10 9 11   CREATININE 0.62 0.66 0.60  CALCIUM 9.1 8.7* 8.6*     Recent Labs  Lab 03/20/2019 1452 03/13/2019 1640  AST 12* 13*  ALT 11 14  ALKPHOS 60 53  BILITOT 0.8 1.1  PROT 6.4* 6.8  ALBUMIN 3.0* 3.2*       Cultures:    Component Value Date/Time   SDES  04/01/2019 0952    BLOOD RIGHT ARM BOTTLES DRAWN AEROBIC AND ANAEROBIC   SPECREQUEST Blood Culture adequate volume 04/01/2019 0952   CULT  04/01/2019 0952    NO GROWTH < 24 HOURS Performed at [facility], [address] [place],  [address]    REPTSTATUS PENDING 04/01/2019 2585     Radiological Exams on Admission: Dg Chest 2 View  Result Date: 03/14/2019 CLINICAL DATA:  Shortness of breath and chest pain EXAM: CHEST - 2 VIEW COMPARISON:  04/01/2019, 01/22/2019 FINDINGS: Left-sided central venous port with tip over the SVC. Emphysematous disease. Stable to slight decreased subpleural right upper lobe lung mass. Chronic interstitial changes. No acute consolidation or effusion. Normal heart size. Aortic atherosclerosis. No pneumothorax. IMPRESSION: No active cardiopulmonary disease. Emphysematous disease and chronic interstitial opacities. No acute airspace disease. Stable to slight decreased subpleural right upper lobe lung mass Electronically Signed   By: Donavan Foil M.D.   On: 03/10/2019 18:09   Dg Chest Port 1 View  Result Date: 04/01/2019 CLINICAL DATA:  Weakness and dehydration.  History of lung carcinoma EXAM: PORTABLE CHEST 1 VIEW COMPARISON:  Chest CT March 30, 2019 and chest radiograph February 19, 2019. FINDINGS: Underlying emphysematous change noted. Nodular lesion in the periphery of the right upper  lobe measuring 1.5 x 1.7 cm is noted, better seen on recent CT. There are scattered areas of fibrosis. No edema or consolidation evident. Heart size and pulmonary vascularity are normal. No adenopathy. Port-A-Cath tip is in the superior vena cava. There is aortic atherosclerosis. No evident bone lesions. IMPRESSION: Underlying emphysematous change in fibrosis. Nodular opacity in the periphery of the right upper lobe again noted. No edema or consolidation. Stable cardiac silhouette. No adenopathy. Port-A-Cath tip in superior vena cava. Aortic Atherosclerosis (ICD10-I70.0). Electronically Signed   By: Lowella Grip III M.D.   On: 04/01/2019 09:49    Chart has been reviewed    Assessment/Plan  73 y.o. female with medical history significant of lung cancer, COPD, HLD, HTN    Admitted for neutropenic fever  Present on Admission: . Neutropenic fever (HCC)-Abrol liver strain on vancomycin metronidazole.  Await results of blood and urine cultures. Respiratory panel ordered  . Hypertension -given soft blood pressures for tonight we will hold home medications  resume as able  . Mixed hyperlipidemia -chronic  . Tobacco abuse -patient states she recently quit we will provide nursing tobacco cessation protocol to assist in her efforts   . COPD (chronic obstructive pulmonary disease) (HCC) -albuterol as needed scheduled Atrovent's  . Malignant neoplasm of upper lobe of right lung [facility]) - will notify oncology pt hs been admited  Pancytopenia in the setting of recent chemotherapy administration defer to oncology at this point no indication for blood product transfusions  . Sepsis ([place]) -  -SIRS criteria met with low  white blood cell count,  tachycardia fever.     With/out  evidence of end organ damage   -Most likely source being:  undetermined  - Obtain serial lactic acid and procalcitonin level.  - Initiate IV antibiotics   - await results of blood and urine culture  - Rehydrat    Hypokalemia - - will replace and repeat in AM,  check magnesium level and replace as needed   Other plan as per orders.  DVT prophylaxis:   SCD  Code Status:  DNR/DNI   as per patient  I had personally discussed CODE STATUS with patient   Family Communication:   Family   at  Bedside  plan of care was discussed   With  Son,   Disposition Plan:    To home once workup is complete and patient is stable                     Would benefit from PT/OT eval prior to DC  Ordered                   Swallow eval - SLP ordered                                      Consults called: email oncology  Admission status:  ED Disposition    ED Disposition Condition Comment   Admit  The patient appears reasonably stabilized for admission considering the current resources, flow, and capabilities available in the ED at this time, and I doubt any other [facility] requiring further screening and/or treatment in the ED prior to admission is  present.       Obs      Level of care     SDU tele indefinitely please discontinue once patient no longer qualifies   Precautions: admitted as  PUI   Droplet precaution  If Covid PCR is negative  - please DC precautions     PPE: Used by the provider:   P100  eye Goggles,  Gloves    Adelfa Lozito 04/03/2019, 12:23 AM    [facility]     after 2 AM please page floor coverage PA If 7AM-7PM, please contact the day team taking care of the patient using [email or web address]

## 2019-04-02 NOTE — ED Notes (Signed)
Patient denies chest pain at this time 

## 2019-04-02 NOTE — ED Notes (Signed)
Patient was being assisted to the bed and she began having mid chest pain. EKG completed.

## 2019-04-02 NOTE — ED Notes (Signed)
Pt. Unable to urinate at this time due still having diarrhea

## 2019-04-02 NOTE — Progress Notes (Signed)
A consult was received from an ED physician for Vancomycin per pharmacy dosing.  The patient's profile has been reviewed for ht/wt/allergies/indication/available labs.    A one time order has been placed for Vancomycin 1g IV.  Further antibiotics/pharmacy consults should be ordered by admitting physician if indicated.                       Thank you, Luiz Ochoa 04/01/2019  5:37 PM

## 2019-04-02 NOTE — ED Notes (Addendum)
Date and time results received: 03/10/2019 1727 (use smartphrase ".now" to insert current time)  Test: wbC Critical Value: 0.1  Name of Provider Notified: Gilford Raid MD  Orders Received? Or Actions Taken?: waiting on orders

## 2019-04-02 NOTE — ED Provider Notes (Signed)
[facility] DEPT Provider Note   CSN: [id number] Arrival date & time: 03/26/2019  1523     History   Chief Complaint Chief Complaint  Patient presents with  . cancer patient  . Hematemesis  . Diarrhea  . Thrush    HPI Stacy [name] is a 73 y.o. female.     Pt presents to the ED today with not feeling well.  She was seen by me at [facility] yesterday for the same.  Pt is undergoing chemo/radiation for lung cancer.  She last received chemo on 11/25 with etoposide and received Neulasta on 11/27.  Pt felt better yesterday after 2L NS.  She was offered admission, but wanted to go home so she could have radiation today.  The pt did get radiation today.  She feels terrible and still has not been able to eat more than ice chips.  She has developed a fever since yesterday.  Labs yesterday, including covid were ok.     Past Medical History:  Diagnosis Date  . Carotid bruit    Bilateral  . Chest pain    occasionally, takes nitro if needed, at rest (1-2 times a month)  . Colon polyps    with divertirticulitis  . COPD (chronic obstructive pulmonary disease) ([place])   . DJD (degenerative joint disease)   . Emphysema lung ([place])   . Family history of adverse reaction to anesthesia    mom PONV  . Hyperlipidemia   . Hypertension   . Lung cancer ([place])   . Lymph node cancer ([place])   . Osteopenia   . Prediabetes   . Vitamin D deficiency     Patient Active Problem List   Diagnosis Date Noted  . Neutropenic fever ([place]) 03/13/2019  . Weight loss 03/26/2019  . Chemotherapy induced neutropenia ([place]) 02/26/2019  . Small cell lung cancer, overlapping sites of right lung ([place]) 01/29/2019  . Encounter for antineoplastic chemotherapy 01/29/2019  . Goals of care, counseling/discussion 01/29/2019  . Exertional chest pain 01/23/2019  . Peripheral artery disease ([place]) 01/23/2019  . Malignant neoplasm of upper lobe of right lung ([place]) 01/11/2019  . Insomnia 05/19/2018   . Smokers' cough ([place]) 06/23/2017  . Atherosclerosis of aorta ([place]) 06/23/2017  . History of diverticulitis 06/23/2017  . COPD (chronic obstructive pulmonary disease) ([place]) 04/02/2017  . Interstitial lung disease ([place]) 04/02/2017  . Tobacco abuse 03/23/2017  . BMI 20.0-20.9, adult 02/17/2015  . Poor compliance with medication 02/17/2015  . Medication management 06/10/2014  . Hypertension   . Mixed hyperlipidemia   . Vitamin D deficiency   . Prediabetes   . DJD (degenerative joint disease)     Past Surgical History:  Procedure Laterality Date  . CATARACT EXTRACTION Bilateral 03/17/2017  . COLONOSCOPY    . ESOPHAGOGASTRODUODENOSCOPY    . HAND SURGERY    . head injury  1995  . LAMINECTOMY  2002   L 5   . NECK SURGERY  2012    c4 -5/6  fushion  . PORTACATH PLACEMENT Left 02/19/2019   Procedure: INSERTION PORT-A-CATH;  Surgeon: Melrose Nakayama, MD;  Location: [facility];  Service: Thoracic;  Laterality: Left;  placed left subclavian  . TONSILLECTOMY    . TRIGGER FINGER RELEASE    . VIDEO BRONCHOSCOPY WITH ENDOBRONCHIAL ULTRASOUND N/A 01/25/2019   Procedure: VIDEO BRONCHOSCOPY WITH ENDOBRONCHIAL ULTRASOUND;  Surgeon: Melrose Nakayama, MD;  Location: [facility];  Service: Thoracic;  Laterality: N/A;     OB History  No obstetric history on file.      Home Medications    Prior to Admission medications   Medication Sig Start Date End Date Taking? Authorizing Provider  albuterol (PROAIR HFA) 108 (90 Base) MCG/ACT inhaler Inhale 2 puffs into the lungs every 6 (six) hours as needed for wheezing or shortness of breath. 06/23/17  Yes Vicie Mutters, PA-C  ALPRAZolam (XANAX) 0.5 MG tablet TAKE 1/2 TO 1 (ONE-HALF TO ONE) TABLET BY MOUTH TWICE DAILY AS NEEDED FOR ANXIETY OR  SLEEP Patient taking differently: Take 0.5-1 mg by mouth 2 (two) times daily as needed for anxiety or sleep.  01/22/19  Yes Vicie Mutters, PA-C  aspirin 81 MG tablet Take 162 mg by mouth daily.    Yes [provider]  atenolol (TENORMIN) 25 MG tablet Take 1 tablet Daily for BP Patient taking differently: Take 25 mg by mouth daily.  08/25/18  Yes [name], MD  cholecalciferol (VITAMIN D) 1000 units tablet Take 1,000 Units by mouth daily.    Yes [provider]  diphenhydramine-acetaminophen (TYLENOL PM) 25-500 MG TABS tablet Take 1 tablet by mouth at bedtime as needed (sleep).    Yes [provider]  ezetimibe (ZETIA) 10 MG tablet TAKE 1 TABLET BY MOUTH ONCE DAILY FOR CHOLESTEROL Patient taking differently: Take 10 mg by mouth daily.  04/07/18  Yes [name], MD  fluconazole (DIFLUCAN) 200 MG tablet Take 1 tablet (200 mg total) by mouth daily for 7 days. 04/01/19 04/08/19 Yes Isla Pence, MD  ibuprofen (ADVIL) 200 MG tablet Take 600 mg by mouth every 6 (six) hours as needed for headache or mild pain.   Yes [provider]  isosorbide mononitrate (IMDUR) 30 MG 24 hr tablet Take 30 mg by mouth daily.   Yes [provider]  lidocaine (XYLOCAINE) 2 % solution Use as directed 15 mLs in the mouth or throat every 6 (six) hours as needed for mouth pain. 04/01/19  Yes Isla Pence, MD  lidocaine-prilocaine (EMLA) cream Apply 1 application topically as needed. 02/19/19  Yes Heilingoetter, Cassandra L, PA-C  nitroGLYCERIN (NITROSTAT) 0.4 MG SL tablet Place 1 tablet (0.4 mg total) under the tongue every 5 (five) minutes as needed for chest pain. 01/23/19 04/23/19 Yes Patwardhan, Manish J, MD  ondansetron (ZOFRAN) 8 MG tablet Take 1 tablet (8 mg total) by mouth every 12 (twelve) hours as needed for nausea or vomiting. 03/23/19  Yes Kyung Rudd, MD  oxyCODONE (OXY IR/ROXICODONE) 5 MG immediate release tablet Take 1 tablet (5 mg total) by mouth every 4 (four) hours as needed for moderate pain or breakthrough pain. 03/05/19  Yes Curt Bears, MD  prochlorperazine (COMPAZINE) 10 MG tablet TAKE ONE TABLET BY MOUTH EVERY 6 HOURS AS NEEDED FOR NAUSEA AND VOMITING  Patient taking differently: Take 10 mg by mouth every 6 (six) hours as needed for nausea or vomiting.  03/19/19  Yes Curt Bears, MD  sucralfate (CARAFATE) 1 g tablet Take 1 tablet (1 g total) by mouth 4 (four) times daily. 03/09/19  Yes Kyung Rudd, MD  dexamethasone (DECADRON) 4 MG tablet Take 1 tablet (4 mg total) by mouth 2 (two) times daily with a meal. Please take 1 tablet twice a day for 3-4 days after chemotherapy. Patient not taking: Reported on 03/14/2019 03/26/19   Heilingoetter, Cassandra L, PA-C  lidocaine (XYLOCAINE) 2 % solution 5 ml swish and swallow or spit for mouth pain/pain with swallowing. May add to [place] Patient not taking: Reported on 03/19/2019 02/26/19  Harle Stanford., PA-C  magic mouthwash SOLN 5 ml swish and spit QID prn mouth pain Patient not taking: Reported on 03/11/2019 02/27/19   Harle Stanford., PA-C  nystatin (MYCOSTATIN) 100000 UNIT/ML suspension Take 5 mLs by mouth 4 (four) times daily. 03/14/19   [provider]    Family History Family History  Problem Relation Age of Onset  . Hyperlipidemia Mother   . Heart disease Mother        7 way bypass/ value replacement/pacemaker  . Heart attack Mother 57  . Diabetes Mother   . Cancer - Other Mother        lung  . Hypertension Father   . Heart attack Father 62  . Cancer - Other Father        throat    Social History Social History   Tobacco Use  . Smoking status: Current Every Day Smoker    Packs/day: 1.00  . Smokeless tobacco: Never Used  . Tobacco comment: smokes 2 cigarettes per day  Substance Use Topics  . Alcohol use: No  . Drug use: Never     Allergies   Dilaudid [hydromorphone hcl], Fentanyl, Penicillins, and Crestor [rosuvastatin]   Review of Systems Review of Systems  Constitutional: Positive for fever.  Gastrointestinal: Positive for nausea.  All other systems reviewed and are negative.    Physical Exam Updated Vital Signs BP (!) 111/98 (BP  Location: Right Arm)   Pulse 100   Temp 98.9 F (37.2 C) (Oral)   Resp 18   Ht 5' 1.5" (1.562 m)   Wt 47.6 kg   SpO2 98%   BMI 19.52 kg/m   Physical Exam Vitals signs and nursing note reviewed.  Constitutional:      Appearance: She is underweight.  HENT:     Head: Normocephalic and atraumatic.     Right Ear: External ear normal.     Left Ear: External ear normal.     Nose: Nose normal.     Mouth/Throat:     Mouth: Mucous membranes are dry.  Eyes:     Extraocular Movements: Extraocular movements intact.     Conjunctiva/sclera: Conjunctivae normal.     Pupils: Pupils are equal, round, and reactive to light.  Neck:     Musculoskeletal: Normal range of motion and neck supple.  Cardiovascular:     Rate and Rhythm: Regular rhythm. Tachycardia present.     Pulses: Normal pulses.     Heart sounds: Normal heart sounds.  Pulmonary:     Effort: Pulmonary effort is normal.     Breath sounds: Normal breath sounds.  Abdominal:     General: Abdomen is flat. Bowel sounds are normal.     Palpations: Abdomen is soft.  Musculoskeletal: Normal range of motion.  Skin:    General: Skin is warm and dry.     Capillary Refill: Capillary refill takes 2 to 3 seconds.  Neurological:     General: No focal deficit present.     Mental Status: She is alert and oriented to person, place, and time.  Psychiatric:        Mood and Affect: Mood normal.        Behavior: Behavior normal.        Thought Content: Thought content normal.      ED Treatments / Results  Labs (all labs ordered are listed, but only abnormal results are displayed) Labs Reviewed  COMPREHENSIVE METABOLIC PANEL - Abnormal; Notable for the following components:  Result Value   Sodium 132 (*)    Potassium 3.3 (*)    Chloride 95 (*)    Glucose, Bld 122 (*)    Calcium 8.6 (*)    Albumin 3.2 (*)    AST 13 (*)    All other components within normal limits  CBC WITH DIFFERENTIAL/PLATELET - Abnormal; Notable for the  following components:   WBC 0.1 (*)    RBC 2.34 (*)    Hemoglobin 7.7 (*)    HCT 22.5 (*)    Platelets 36 (*)    Neutro Abs 0.0 (*)    Lymphs Abs 0.0 (*)    Monocytes Absolute 0.0 (*)    All other components within normal limits  CULTURE, BLOOD (ROUTINE X 2)  CULTURE, BLOOD (ROUTINE X 2)  RESPIRATORY PANEL BY PCR  LACTIC ACID, PLASMA  LACTIC ACID, PLASMA  PROTIME-INR  URINALYSIS, ROUTINE W REFLEX MICROSCOPIC  POC SARS CORONAVIRUS 2 AG -  ED  TYPE AND SCREEN  ABO/RH    EKG None  Radiology Dg Chest 2 View  Result Date: 03/06/2019 CLINICAL DATA:  Shortness of breath and chest pain EXAM: CHEST - 2 VIEW COMPARISON:  04/01/2019, 01/22/2019 FINDINGS: Left-sided central venous port with tip over the SVC. Emphysematous disease. Stable to slight decreased subpleural right upper lobe lung mass. Chronic interstitial changes. No acute consolidation or effusion. Normal heart size. Aortic atherosclerosis. No pneumothorax. IMPRESSION: No active cardiopulmonary disease. Emphysematous disease and chronic interstitial opacities. No acute airspace disease. Stable to slight decreased subpleural right upper lobe lung mass Electronically Signed   By: Donavan Foil M.D.   On: 03/27/2019 18:09   Dg Chest Port 1 View  Result Date: 04/01/2019 CLINICAL DATA:  Weakness and dehydration.  History of lung carcinoma EXAM: PORTABLE CHEST 1 VIEW COMPARISON:  Chest CT March 30, 2019 and chest radiograph February 19, 2019. FINDINGS: Underlying emphysematous change noted. Nodular lesion in the periphery of the right upper lobe measuring 1.5 x 1.7 cm is noted, better seen on recent CT. There are scattered areas of fibrosis. No edema or consolidation evident. Heart size and pulmonary vascularity are normal. No adenopathy. Port-A-Cath tip is in the superior vena cava. There is aortic atherosclerosis. No evident bone lesions. IMPRESSION: Underlying emphysematous change in fibrosis. Nodular opacity in the periphery of the  right upper lobe again noted. No edema or consolidation. Stable cardiac silhouette. No adenopathy. Port-A-Cath tip in superior vena cava. Aortic Atherosclerosis (ICD10-I70.0). Electronically Signed   By: Lowella Grip III M.D.   On: 04/01/2019 09:49    Procedures Procedures (including critical care time)  Medications Ordered in ED Medications  sodium chloride 0.9 % bolus 1,000 mL (has no administration in time range)  sodium chloride 0.9 % bolus 1,000 mL (1,000 mLs Intravenous New Bag/Given 03/06/2019 1805)  ondansetron (ZOFRAN) injection 4 mg (4 mg Intravenous Given 03/08/2019 1805)  acetaminophen (TYLENOL) 160 MG/5ML solution 1,000 mg (1,000 mg Oral Given 04/01/2019 1722)  aztreonam (AZACTAM) 2 g in sodium chloride 0.9 % 100 mL IVPB (2 g Intravenous New Bag/Given 03/06/2019 2033)  vancomycin (VANCOCIN) IVPB 1000 mg/200 mL premix (0 mg Intravenous Stopped 03/26/2019 1905)     Initial Impression / Assessment and Plan / ED Course  I have reviewed the triage vital signs and the nursing notes.  Pertinent labs & imaging results that were available during my care of the patient were reviewed by me and considered in my medical decision making (see chart for details).     Pt  has a fever today.  She only has a wbc of 0.1, so she is given azactam and vancomycin for a neutropenic fever.  She is pcn allergic.  Pt given IVFs.  She wa placed on neutropenic precautions.  She has still not been able to urinate, but she is dehydrated.  Pt d/w Dr. Roel Cluck ([facility]) for admission.  Pt d/w Dr. Irene Limbo (oncology) who said not to give any additional Neulasta.   CRITICAL CARE Performed by: Isla Pence   Total critical care time: 30 minutes  Critical care time was exclusive of separately billable procedures and treating other patients.  Critical care was necessary to treat or prevent imminent or life-threatening deterioration.  Critical care was time spent personally by me on the following activities:  development of treatment plan with patient and/or surrogate as well as nursing, discussions with consultants, evaluation of patient's response to treatment, examination of patient, obtaining history from patient or surrogate, ordering and performing treatments and interventions, ordering and review of laboratory studies, ordering and review of radiographic studies, pulse oximetry and re-evaluation of patient's condition.  Final Clinical Impressions(s) / ED Diagnoses   Final diagnoses:  Neutropenic fever ([place])  Dehydration  Thrush  Nausea  Small cell lung cancer [facility])    ED Discharge Orders    None       Isla Pence, MD 03/26/2019 2120

## 2019-04-02 NOTE — Telephone Encounter (Signed)
LVM with Neutropenic precautions information . PT received Neulasta.

## 2019-04-03 ENCOUNTER — Ambulatory Visit: Payer: Medicare HMO

## 2019-04-03 DIAGNOSIS — D6481 Anemia due to antineoplastic chemotherapy: Secondary | ICD-10-CM | POA: Diagnosis not present

## 2019-04-03 DIAGNOSIS — A419 Sepsis, unspecified organism: Secondary | ICD-10-CM | POA: Diagnosis present

## 2019-04-03 DIAGNOSIS — C3481 Malignant neoplasm of overlapping sites of right bronchus and lung: Secondary | ICD-10-CM | POA: Diagnosis not present

## 2019-04-03 DIAGNOSIS — D61818 Other pancytopenia: Secondary | ICD-10-CM | POA: Diagnosis not present

## 2019-04-03 DIAGNOSIS — Z8249 Family history of ischemic heart disease and other diseases of the circulatory system: Secondary | ICD-10-CM | POA: Diagnosis not present

## 2019-04-03 DIAGNOSIS — Z7982 Long term (current) use of aspirin: Secondary | ICD-10-CM | POA: Diagnosis not present

## 2019-04-03 DIAGNOSIS — T451X5A Adverse effect of antineoplastic and immunosuppressive drugs, initial encounter: Secondary | ICD-10-CM

## 2019-04-03 DIAGNOSIS — I25118 Atherosclerotic heart disease of native coronary artery with other forms of angina pectoris: Secondary | ICD-10-CM | POA: Diagnosis not present

## 2019-04-03 DIAGNOSIS — D696 Thrombocytopenia, unspecified: Secondary | ICD-10-CM | POA: Diagnosis not present

## 2019-04-03 DIAGNOSIS — M1990 Unspecified osteoarthritis, unspecified site: Secondary | ICD-10-CM | POA: Diagnosis present

## 2019-04-03 DIAGNOSIS — G893 Neoplasm related pain (acute) (chronic): Secondary | ICD-10-CM | POA: Diagnosis not present

## 2019-04-03 DIAGNOSIS — J439 Emphysema, unspecified: Secondary | ICD-10-CM | POA: Diagnosis present

## 2019-04-03 DIAGNOSIS — J9601 Acute respiratory failure with hypoxia: Secondary | ICD-10-CM | POA: Diagnosis not present

## 2019-04-03 DIAGNOSIS — Z681 Body mass index (BMI) 19 or less, adult: Secondary | ICD-10-CM | POA: Diagnosis not present

## 2019-04-03 DIAGNOSIS — D61810 Antineoplastic chemotherapy induced pancytopenia: Secondary | ICD-10-CM | POA: Diagnosis not present

## 2019-04-03 DIAGNOSIS — I200 Unstable angina: Secondary | ICD-10-CM | POA: Diagnosis not present

## 2019-04-03 DIAGNOSIS — I10 Essential (primary) hypertension: Secondary | ICD-10-CM | POA: Diagnosis present

## 2019-04-03 DIAGNOSIS — J449 Chronic obstructive pulmonary disease, unspecified: Secondary | ICD-10-CM | POA: Diagnosis not present

## 2019-04-03 DIAGNOSIS — R5081 Fever presenting with conditions classified elsewhere: Secondary | ICD-10-CM | POA: Diagnosis not present

## 2019-04-03 DIAGNOSIS — Z20828 Contact with and (suspected) exposure to other viral communicable diseases: Secondary | ICD-10-CM | POA: Diagnosis not present

## 2019-04-03 DIAGNOSIS — R0600 Dyspnea, unspecified: Secondary | ICD-10-CM | POA: Diagnosis not present

## 2019-04-03 DIAGNOSIS — Z515 Encounter for palliative care: Secondary | ICD-10-CM | POA: Diagnosis not present

## 2019-04-03 DIAGNOSIS — F419 Anxiety disorder, unspecified: Secondary | ICD-10-CM | POA: Diagnosis present

## 2019-04-03 DIAGNOSIS — B370 Candidal stomatitis: Secondary | ICD-10-CM

## 2019-04-03 DIAGNOSIS — C3411 Malignant neoplasm of upper lobe, right bronchus or lung: Secondary | ICD-10-CM | POA: Diagnosis not present

## 2019-04-03 DIAGNOSIS — Z8349 Family history of other endocrine, nutritional and metabolic diseases: Secondary | ICD-10-CM | POA: Diagnosis not present

## 2019-04-03 DIAGNOSIS — R64 Cachexia: Secondary | ICD-10-CM | POA: Diagnosis not present

## 2019-04-03 DIAGNOSIS — I25110 Atherosclerotic heart disease of native coronary artery with unstable angina pectoris: Secondary | ICD-10-CM | POA: Diagnosis present

## 2019-04-03 DIAGNOSIS — I4891 Unspecified atrial fibrillation: Secondary | ICD-10-CM | POA: Diagnosis not present

## 2019-04-03 DIAGNOSIS — E43 Unspecified severe protein-calorie malnutrition: Secondary | ICD-10-CM | POA: Diagnosis not present

## 2019-04-03 DIAGNOSIS — M8580 Other specified disorders of bone density and structure, unspecified site: Secondary | ICD-10-CM | POA: Diagnosis present

## 2019-04-03 DIAGNOSIS — E860 Dehydration: Secondary | ICD-10-CM | POA: Diagnosis present

## 2019-04-03 DIAGNOSIS — R918 Other nonspecific abnormal finding of lung field: Secondary | ICD-10-CM | POA: Diagnosis not present

## 2019-04-03 DIAGNOSIS — Z833 Family history of diabetes mellitus: Secondary | ICD-10-CM | POA: Diagnosis not present

## 2019-04-03 DIAGNOSIS — D709 Neutropenia, unspecified: Secondary | ICD-10-CM | POA: Diagnosis not present

## 2019-04-03 DIAGNOSIS — R7303 Prediabetes: Secondary | ICD-10-CM | POA: Diagnosis present

## 2019-04-03 DIAGNOSIS — Z7189 Other specified counseling: Secondary | ICD-10-CM | POA: Diagnosis not present

## 2019-04-03 DIAGNOSIS — Z66 Do not resuscitate: Secondary | ICD-10-CM | POA: Diagnosis not present

## 2019-04-03 DIAGNOSIS — J9819 Other pulmonary collapse: Secondary | ICD-10-CM | POA: Diagnosis not present

## 2019-04-03 LAB — RESPIRATORY PANEL BY PCR

## 2019-04-03 LAB — COMPREHENSIVE METABOLIC PANEL
ALT: 12 U/L (ref 0–44)
AST: 12 U/L — ABNORMAL LOW (ref 15–41)
Albumin: 2.7 g/dL — ABNORMAL LOW (ref 3.5–5.0)
Alkaline Phosphatase: 53 U/L (ref 38–126)
Anion gap: 9 (ref 5–15)
BUN: 8 mg/dL (ref 8–23)
CO2: 22 mmol/L (ref 22–32)
Calcium: 7.7 mg/dL — ABNORMAL LOW (ref 8.9–10.3)
Chloride: 104 mmol/L (ref 98–111)
Creatinine, Ser: 0.45 mg/dL (ref 0.44–1.00)
GFR calc Af Amer: >60 mL/min (ref >60)
GFR calc non Af Amer: >60 mL/min (ref >60)
Glucose, Bld: 109 mg/dL — ABNORMAL HIGH (ref 70–99)
Potassium: 3.2 mmol/L — ABNORMAL LOW (ref 3.5–5.1)
Sodium: 135 mmol/L (ref 135–145)
Total Bilirubin: 0.5 mg/dL (ref 0.3–1.2)
Total Protein: 5.8 g/dL — ABNORMAL LOW (ref 6.5–8.1)

## 2019-04-03 LAB — URINALYSIS, ROUTINE W REFLEX MICROSCOPIC
Bilirubin Urine: NEGATIVE
Glucose, UA: NEGATIVE mg/dL
Ketones, ur: 5 mg/dL — AB
Leukocytes,Ua: NEGATIVE
Nitrite: NEGATIVE
Protein, ur: NEGATIVE mg/dL
Specific Gravity, Urine: 1.011 (ref 1.005–1.030)
pH: 5 (ref 5.0–8.0)

## 2019-04-03 LAB — PREPARE RBC (CROSSMATCH)

## 2019-04-03 LAB — HEMOGLOBIN AND HEMATOCRIT, BLOOD
HCT: 20.8 % — ABNORMAL LOW (ref 36.0–46.0)
HCT: 21.5 % — ABNORMAL LOW (ref 36.0–46.0)
Hemoglobin: 7 g/dL — ABNORMAL LOW (ref 12.0–15.0)
Hemoglobin: 7.4 g/dL — ABNORMAL LOW (ref 12.0–15.0)

## 2019-04-03 LAB — TSH: TSH: 1.885 u[IU]/mL (ref 0.350–4.500)

## 2019-04-03 LAB — PHOSPHORUS: Phosphorus: 2 mg/dL — ABNORMAL LOW (ref 2.5–4.6)

## 2019-04-03 LAB — ABO/RH: ABO/RH(D): O NEG

## 2019-04-03 LAB — CBC
HCT: 21.9 % — ABNORMAL LOW (ref 36.0–46.0)
Hemoglobin: 7.3 g/dL — ABNORMAL LOW (ref 12.0–15.0)
MCH: 32.9 pg (ref 26.0–34.0)
MCHC: 33.3 g/dL (ref 30.0–36.0)
MCV: 98.6 fL (ref 80.0–100.0)
Platelets: 23 10*3/uL — CL (ref 150–400)
RBC: 2.22 MIL/uL — ABNORMAL LOW (ref 3.87–5.11)
RDW: 13.2 % (ref 11.5–15.5)
WBC: 0.1 10*3/uL — CL (ref 4.0–10.5)
nRBC: 0 % (ref 0.0–0.2)

## 2019-04-03 LAB — PREALBUMIN: Prealbumin: 8.9 mg/dL — ABNORMAL LOW (ref 18–38)

## 2019-04-03 LAB — POTASSIUM
Potassium: 3.1 mmol/L — ABNORMAL LOW (ref 3.5–5.1)
Potassium: 3.2 mmol/L — ABNORMAL LOW (ref 3.5–5.1)

## 2019-04-03 LAB — MRSA PCR SCREENING: MRSA by PCR: NEGATIVE

## 2019-04-03 LAB — MAGNESIUM: Magnesium: 1.3 mg/dL — ABNORMAL LOW (ref 1.7–2.4)

## 2019-04-03 LAB — TROPONIN I (HIGH SENSITIVITY): Troponin I (High Sensitivity): 98 ng/L — ABNORMAL HIGH (ref <18)

## 2019-04-03 MED ORDER — BOOST / RESOURCE BREEZE PO LIQD CUSTOM: 1.0000 | Freq: Three times a day (TID) | ORAL | Status: DC

## 2019-04-03 MED ORDER — DIPHENHYDRAMINE HCL 25 MG PO CAPS
25.0000 mg | ORAL_CAPSULE | Freq: Once | ORAL | Status: AC
  Administered 2019-04-03: 12:00:00 25 mg via ORAL
  Filled 2019-04-03: qty 1

## 2019-04-03 MED ORDER — SODIUM CHLORIDE 0.9 % IV BOLUS
500.0000 mL | Freq: Once | INTRAVENOUS | Status: AC
  Administered 2019-04-03: 07:00:00 500 mL via INTRAVENOUS

## 2019-04-03 MED ORDER — MIDODRINE HCL 5 MG PO TABS
2.5000 mg | ORAL_TABLET | Freq: Three times a day (TID) | ORAL | Status: DC | PRN
  Administered 2019-04-03: 13:00:00 2.5 mg via ORAL
  Filled 2019-04-03: qty 1

## 2019-04-03 MED ORDER — ALBUTEROL SULFATE HFA 108 (90 BASE) MCG/ACT IN AERS
2.0000 | INHALATION_SPRAY | Freq: Four times a day (QID) | RESPIRATORY_TRACT | Status: DC | PRN
  Administered 2019-04-03: 04:00:00 2 via RESPIRATORY_TRACT
  Filled 2019-04-03: qty 6.7

## 2019-04-03 MED ORDER — FOLIC ACID 5 MG/ML IJ SOLN
1.0000 mg | Freq: Every day | INTRAMUSCULAR | Status: DC
  Administered 2019-04-03 – 2019-04-04 (×2): 1 mg via INTRAVENOUS
  Filled 2019-04-03 (×2): qty 0.2

## 2019-04-03 MED ORDER — SODIUM CHLORIDE 0.9 % IV SOLN
2.0000 g | Freq: Three times a day (TID) | INTRAVENOUS | Status: DC
  Administered 2019-04-03 – 2019-04-04 (×4): 2 g via INTRAVENOUS
  Filled 2019-04-03 (×5): qty 2

## 2019-04-03 MED ORDER — SODIUM CHLORIDE 0.9 % IV SOLN
INTRAVENOUS | Status: AC
  Administered 2019-04-03: 16:00:00 via INTRAVENOUS

## 2019-04-03 MED ORDER — SODIUM CHLORIDE 0.9% IV SOLUTION
Freq: Once | INTRAVENOUS | Status: AC
  Administered 2019-04-03: 12:00:00 via INTRAVENOUS

## 2019-04-03 MED ORDER — POTASSIUM CHLORIDE 10 MEQ/100ML IV SOLN
10.0000 meq | INTRAVENOUS | Status: AC
  Administered 2019-04-03 (×4): 10 meq via INTRAVENOUS
  Filled 2019-04-03 (×5): qty 100

## 2019-04-03 MED ORDER — CHLORHEXIDINE GLUCONATE CLOTH 2 % EX PADS
6.0000 | MEDICATED_PAD | Freq: Every day | CUTANEOUS | Status: DC
  Administered 2019-04-04: 6 via TOPICAL

## 2019-04-03 MED ORDER — POTASSIUM CHLORIDE 20 MEQ PO PACK: 40.0000 meq | PACK | Freq: Once | ORAL | Status: DC

## 2019-04-03 MED ORDER — SODIUM CHLORIDE 0.9 % IV BOLUS
250.0000 mL | Freq: Once | INTRAVENOUS | Status: AC
  Administered 2019-04-03: 13:00:00 250 mL via INTRAVENOUS

## 2019-04-03 MED ORDER — VANCOMYCIN HCL IN DEXTROSE 750-5 MG/150ML-% IV SOLN
750.0000 mg | INTRAVENOUS | Status: DC
  Administered 2019-04-03: 18:00:00 750 mg via INTRAVENOUS
  Filled 2019-04-03 (×2): qty 150

## 2019-04-03 MED ORDER — ONDANSETRON HCL 4 MG/2ML IJ SOLN: 4.0000 mg | Freq: Four times a day (QID) | INTRAMUSCULAR | Status: DC | PRN

## 2019-04-03 MED ORDER — IPRATROPIUM BROMIDE HFA 17 MCG/ACT IN AERS
2.0000 | INHALATION_SPRAY | Freq: Four times a day (QID) | RESPIRATORY_TRACT | Status: DC
  Filled 2019-04-03 (×2): qty 12.9

## 2019-04-03 MED ORDER — NITROGLYCERIN 0.4 MG SL SUBL
0.4000 mg | SUBLINGUAL_TABLET | SUBLINGUAL | Status: DC | PRN
  Administered 2019-04-03 – 2019-04-04 (×2): 0.4 mg via SUBLINGUAL
  Filled 2019-04-03 (×2): qty 1

## 2019-04-03 MED ORDER — ACETAMINOPHEN 325 MG PO TABS
650.0000 mg | ORAL_TABLET | Freq: Once | ORAL | Status: AC
  Administered 2019-04-03: 12:00:00 650 mg via ORAL
  Filled 2019-04-03: qty 2

## 2019-04-03 MED ORDER — SODIUM CHLORIDE 0.9 % IV SOLN
INTRAVENOUS | Status: AC
  Administered 2019-04-03: 01:00:00 75 mL/h via INTRAVENOUS

## 2019-04-03 MED ORDER — ACETAMINOPHEN 325 MG PO TABS: 650.0000 mg | ORAL_TABLET | Freq: Four times a day (QID) | ORAL | Status: DC | PRN

## 2019-04-03 MED ORDER — IPRATROPIUM BROMIDE HFA 17 MCG/ACT IN AERS
2.0000 | INHALATION_SPRAY | Freq: Four times a day (QID) | RESPIRATORY_TRACT | Status: DC | PRN
  Filled 2019-04-03: qty 12.9

## 2019-04-03 MED ORDER — POTASSIUM CHLORIDE 20 MEQ PO PACK
20.0000 meq | PACK | Freq: Once | ORAL | Status: AC
  Administered 2019-04-03: 16:00:00 20 meq via ORAL
  Filled 2019-04-03: qty 1

## 2019-04-03 MED ORDER — SODIUM CHLORIDE 0.9 % IV BOLUS
500.0000 mL | Freq: Once | INTRAVENOUS | Status: AC
  Administered 2019-04-03: 21:00:00 500 mL via INTRAVENOUS

## 2019-04-03 MED ORDER — OXYCODONE HCL 5 MG PO TABS
5.0000 mg | ORAL_TABLET | ORAL | Status: DC | PRN
  Administered 2019-04-03 – 2019-04-04 (×5): 5 mg via ORAL
  Filled 2019-04-03 (×5): qty 1

## 2019-04-03 MED ORDER — ALBUTEROL SULFATE HFA 108 (90 BASE) MCG/ACT IN AERS
2.0000 | INHALATION_SPRAY | Freq: Four times a day (QID) | RESPIRATORY_TRACT | Status: DC | PRN
  Filled 2019-04-03: qty 6.7

## 2019-04-03 MED ORDER — ASPIRIN EC 81 MG PO TBEC
162.0000 mg | DELAYED_RELEASE_TABLET | Freq: Every day | ORAL | Status: DC
  Administered 2019-04-03: 10:00:00 162 mg via ORAL
  Filled 2019-04-03: qty 2

## 2019-04-03 MED ORDER — ACETAMINOPHEN 650 MG RE SUPP: 650.0000 mg | Freq: Four times a day (QID) | RECTAL | Status: DC | PRN

## 2019-04-03 MED ORDER — ALPRAZOLAM 0.5 MG PO TABS
0.5000 mg | ORAL_TABLET | Freq: Two times a day (BID) | ORAL | Status: DC | PRN
  Administered 2019-04-04: 1 mg via ORAL
  Filled 2019-04-03 (×2): qty 2

## 2019-04-03 MED ORDER — EZETIMIBE 10 MG PO TABS
10.0000 mg | ORAL_TABLET | Freq: Every day | ORAL | Status: DC
  Administered 2019-04-03 – 2019-04-04 (×2): 10 mg via ORAL
  Filled 2019-04-03 (×2): qty 1

## 2019-04-03 MED ORDER — ONDANSETRON HCL 4 MG PO TABS: 4.0000 mg | ORAL_TABLET | Freq: Four times a day (QID) | ORAL | Status: DC | PRN

## 2019-04-03 MED ORDER — ALBUTEROL SULFATE HFA 108 (90 BASE) MCG/ACT IN AERS
2.0000 | INHALATION_SPRAY | Freq: Three times a day (TID) | RESPIRATORY_TRACT | Status: DC
  Administered 2019-04-03 – 2019-04-04 (×3): 2 via RESPIRATORY_TRACT
  Filled 2019-04-03: qty 6.7

## 2019-04-03 NOTE — Progress Notes (Signed)
CRITICAL VALUE ALERT  Critical Value:  Platelet- 23   Date & Time Notied:  04/03/2019 03:10 AM  Provider Notified: Dr. Josephine Cables  Orders Received/Actions taken: No new orders currently. Will continue to monitor.

## 2019-04-03 NOTE — Progress Notes (Signed)
Notified Md. Kamineni that pt's BP was still 28Z systolic, even after the 662HU bolus. Midodrine ordered and another 250 cc bolus . Also gave verbal order to continue to blood, as this does not seem to be a blood transfusion issue

## 2019-04-03 NOTE — Progress Notes (Addendum)
SLP Cancellation Note  Patient Details Name: FELICHA FRAYNE MRN: [id number] DOB: 03-May-1946   Cancelled treatment:       Reason Eval/Treat Not Completed: Patient declined, due to significant oral pain related to severe thrush. RN and pt request BSE at a later time. Will continue efforts. Pt currently receiving clear liquid diet.  Carmela Rima, [place] Speech Language Pathologist Office: [phone] Pager: [phone]  Shonna Chock 04/03/2019, 1:12 PM

## 2019-04-03 NOTE — Progress Notes (Signed)
Pt's HR 130's - 140's, Provider notified, verbal order for 500 cc NS bolus over 30 mins

## 2019-04-03 NOTE — Progress Notes (Signed)
HEMATOLOGY-ONCOLOGY PROGRESS NOTE  SUBJECTIVE: The patient presented to the emergency room yesterday after her radiation treatment.  She was complaining of difficulty eating, weakness, and was noted to be febrile in the emergency room.  She has been receiving chemotherapy for limited stage small cell lung cancer.  She started cycle #3 of carboplatin and etoposide on 03/26/2019 and received Neulasta on 03/30/2019.  When seen today, she reports ongoing fatigue and some shortness of breath and chest discomfort.  She has an ongoing cough which is unchanged from her baseline.  She denies nausea vomiting.  Has not noticed any bleeding.  No fever since yesterday afternoon.  Oncology History  Malignant neoplasm of upper lobe of right lung ([place])  01/11/2019 Initial Diagnosis   Malignant neoplasm of upper lobe of right lung ([place])   02/12/2019 -  Chemotherapy   The patient had palonosetron (ALOXI) injection 0.25 mg, 0.25 mg, Intravenous,  Once, 3 of 4 cycles Administration: 0.25 mg (02/12/2019), 0.25 mg (03/05/2019), 0.25 mg (03/26/2019) pegfilgrastim-cbqv (UDENYCA) injection 6 mg, 6 mg, Subcutaneous, Once, 2 of 3 cycles CARBOplatin (PARAPLATIN) 330 mg in sodium chloride 0.9 % 250 mL chemo infusion, 330 mg (100 % of original dose 329 mg), Intravenous,  Once, 3 of 4 cycles Dose modification: 329 mg (original dose 329 mg, Cycle 1), 329 mg (original dose 329 mg, Cycle 2), 329 mg (original dose 329 mg, Cycle 3) Administration: 330 mg (02/12/2019), 330 mg (03/05/2019), 330 mg (03/26/2019) etoposide (VEPESID) 180 mg in sodium chloride 0.9 % 500 mL chemo infusion, 120 mg/m2 = 150 mg, Intravenous,  Once, 3 of 4 cycles Dose modification: 120 mg/m2 (original dose 100 mg/m2, Cycle 1, Reason: Provider Judgment, Comment: Increase to 120mg /m2) Administration: 180 mg (02/12/2019), 180 mg (02/13/2019), 180 mg (02/14/2019), 180 mg (03/05/2019), 180 mg (03/06/2019), 180 mg (03/07/2019), 180 mg (03/26/2019), 180 mg (03/27/2019), 180  mg (03/28/2019) fosaprepitant (EMEND) 150 mg, dexamethasone (DECADRON) 12 mg in sodium chloride 0.9 % 145 mL IVPB, , Intravenous,  Once, 3 of 4 cycles Administration:  (02/12/2019),  (03/05/2019),  (03/26/2019)  for chemotherapy treatment.       REVIEW OF SYSTEMS:   As noted in the HPI.  I have reviewed the past medical history, past surgical history, social history and family history with the patient and they are unchanged from previous note.   PHYSICAL EXAMINATION: ECOG PERFORMANCE STATUS: 2 - Symptomatic, <50% confined to bed  Vitals:   04/03/19 0845 04/03/19 0900  BP: (!) 141/67 (!) 97/57  Pulse: (!) 121 (!) 115  Resp: (!) 21 16  Temp:    SpO2: 100% 99%   Filed Weights   03/14/2019 1542 03/10/2019 1546  Weight: 105 lb (47.6 kg) 105 lb (47.6 kg)    Intake/Output from previous day: 11/30 0701 - 12/01 0700 In: 4196 [I.V.:350.7; IV Piggyback:2825.4] Out: 102 [Urine:100; Stool:2]  GENERAL: Chronically ill-appearing female, no distress OROPHARYNX: Thrush noted on tongue NECK: supple, thyroid normal size, non-tender, without nodularity LYMPH:  no palpable lymphadenopathy in the cervical, axillary or inguinal LUNGS: Diminished breath sounds HEART: Tachycardic, no murmurs, no lower extremity edema ABDOMEN:abdomen soft, non-tender and normal bowel sounds Musculoskeletal:no cyanosis of digits and no clubbing  NEURO: alert & oriented x 3 with fluent speech, no focal motor/sensory deficits  LABORATORY DATA:  I have reviewed the data as listed CMP Latest Ref Rng & Units 04/03/2019 03/14/2019 03/08/2019  Glucose 70 - 99 mg/dL 109(H) 122(H) 120(H)  BUN 8 - 23 mg/dL 8 11 9   Creatinine 0.44 - 1.00  mg/dL 0.45 0.60 0.66  Sodium 135 - 145 mmol/L 135 132(L) 132(L)  Potassium 3.5 - 5.1 mmol/L 3.2(L) 3.3(L) 3.5  Chloride 98 - 111 mmol/L 104 95(L) 95(L)  CO2 22 - 32 mmol/L 22 25 26   Calcium 8.9 - 10.3 mg/dL 7.7(L) 8.6(L) 8.7(L)  Total Protein 6.5 - 8.1 g/dL 5.8(L) 6.8 6.4(L)  Total  Bilirubin 0.3 - 1.2 mg/dL 0.5 1.1 0.8  Alkaline Phos 38 - 126 U/L 53 53 60  AST 15 - 41 U/L 12(L) 13(L) 12(L)  ALT 0 - 44 U/L 12 14 11     Lab Results  Component Value Date   WBC 0.1 (LL) 04/03/2019   HGB 7.3 (L) 04/03/2019   HCT 21.9 (L) 04/03/2019   MCV 98.6 04/03/2019   PLT 23 (LL) 04/03/2019   NEUTROABS 0.0 (L) 03/05/2019    Dg Chest 2 View  Result Date: 03/27/2019 CLINICAL DATA:  Shortness of breath and chest pain EXAM: CHEST - 2 VIEW COMPARISON:  04/01/2019, 01/22/2019 FINDINGS: Left-sided central venous port with tip over the SVC. Emphysematous disease. Stable to slight decreased subpleural right upper lobe lung mass. Chronic interstitial changes. No acute consolidation or effusion. Normal heart size. Aortic atherosclerosis. No pneumothorax. IMPRESSION: No active cardiopulmonary disease. Emphysematous disease and chronic interstitial opacities. No acute airspace disease. Stable to slight decreased subpleural right upper lobe lung mass Electronically Signed   By: Donavan Foil M.D.   On: 03/30/2019 18:09   Ct Chest W Contrast  Result Date: 03/30/2019 CLINICAL DATA:  Patient with history of small cell lung carcinoma patient undergoing EXAM: CT CHEST WITH CONTRAST TECHNIQUE: Multidetector CT imaging of the chest was performed during intravenous contrast administration. CONTRAST:  54mL OMNIPAQUE IOHEXOL 300 MG/ML  SOLN COMPARISON:  PET-CT 01/03/2019 FINDINGS: Cardiovascular: Normal heart size. Thoracic aortic vascular calcifications. Trace fluid superior pericardial recess. Mediastinum/Nodes: Interval decrease in size of posterior right paratracheal lymph node measuring 5 mm (image 22; series 2), previously 8 mm. Interval decrease in size of 1.1 cm precarinal lymph node (image 52; series 2), previously 2.8 cm. Interval decrease in size of subcarinal lymph node measuring 0.8 cm, previously 1.6 cm. Significant interval decrease in size of previously visualized right hilar adenopathy now with  small volume residual infiltrative soft tissue (image 56; series 2). Normal appearance of the esophagus. Lungs/Pleura: Central airways are patent. Centrilobular and paraseptal emphysematous changes. Similar-appearing bilateral subpleural reticular opacities. Interval decrease in size of subpleural nodule right upper lobe measuring 2.4 x 1 cm (image 30 series 7), previously 3.9 x 2.8 cm. Interval decrease in size of right upper lobe nodule measuring 0.7 x 0.8 cm (image 40; series 7), previously 2.0 x 1.9 cm. No pleural effusion or pneumothorax. Upper Abdomen: No acute process. Musculoskeletal: Thoracic spine degenerative changes. No aggressive or acute appearing osseous lesions. IMPRESSION: 1. Interval decrease in size of right upper lobe nodules when compared to prior exam compatible with treatment response. 2. Interval decrease in size of mediastinal and right hilar adenopathy. 3. Emphysema and aortic atherosclerosis. Electronically Signed   By: Lovey Newcomer M.D.   On: 03/30/2019 14:35   Dg Chest Port 1 View  Result Date: 04/01/2019 CLINICAL DATA:  Weakness and dehydration.  History of lung carcinoma EXAM: PORTABLE CHEST 1 VIEW COMPARISON:  Chest CT March 30, 2019 and chest radiograph February 19, 2019. FINDINGS: Underlying emphysematous change noted. Nodular lesion in the periphery of the right upper lobe measuring 1.5 x 1.7 cm is noted, better seen on recent CT. There are scattered  areas of fibrosis. No edema or consolidation evident. Heart size and pulmonary vascularity are normal. No adenopathy. Port-A-Cath tip is in the superior vena cava. There is aortic atherosclerosis. No evident bone lesions. IMPRESSION: Underlying emphysematous change in fibrosis. Nodular opacity in the periphery of the right upper lobe again noted. No edema or consolidation. Stable cardiac silhouette. No adenopathy. Port-A-Cath tip in superior vena cava. Aortic Atherosclerosis (ICD10-I70.0). Electronically Signed   By: Lowella Grip III M.D.   On: 04/01/2019 09:49    ASSESSMENT AND PLAN: 1.  Limited stage small cell lung cancer 2.  Pancytopenia secondary to recent chemotherapy 3.  Neutropenic fever 4.  Oral candidiasis 5.  COPD 6.  Hypokalemia 7.  Hypomagnesemia  -The patient already received Neulasta with her chemotherapy.  Anticipate that her white blood cell count will slowly improve over the next few days.  Monitor CBC with differential closely.  No additional G-CSF is recommended. -The patient's hemoglobin today is 7.3.  Recommend PRBC transfusion to keep her hemoglobin above 8.  I have ordered 1 unit of PRBCs today. -Platelet count was down to 23,000.  She is not actively bleeding.  Hold off on platelet transfusion unless active bleeding or platelet count less than 20,000.   LOS: 0 days   Mikey Bussing, DNP, AGPCNP-BC, AOCNP 04/03/19

## 2019-04-03 NOTE — Progress Notes (Signed)
  Radiation Oncology         [phone] ________________________________  Name: Stacy [name] MRN: [id number]  Date: 02/02/2019  DOB: [birth year]  SIMULATION AND TREATMENT PLANNING NOTE  DIAGNOSIS:     ICD-10-CM   1. Malignant neoplasm of upper lobe of right lung ([place])  C34.11      Site:  chest  NARRATIVE:  The patient was brought to the [facility].  Identity was confirmed.  All relevant records and images related to the planned course of therapy were reviewed.   Written consent to proceed with treatment was confirmed which was freely given after reviewing the details related to the planned course of therapy had been reviewed with the patient.  Then, the patient was set-up in a stable reproducible  supine position for radiation therapy.  CT images were obtained.  Surface markings were placed.    Medically necessary complex treatment device(s) for immobilization:  Vac-lock bag.   The CT images were loaded into the planning software.  Then the target and avoidance structures were contoured.  Treatment planning then occurred.  The radiation prescription was entered and confirmed.  A total of 4 complex treatment devices were fabricated which relate to the designed radiation treatment fields. Additional reduced fields will be used as necessary to improve the dose homogeneity of the plan. Each of these customized fields/ complex treatment devices will be used on a daily basis during the radiation course. I have requested : 3D Simulation  I have requested a DVH of the following structures: target volume, spinal cord, lungs, heart.   The patient will undergo daily image guidance to ensure accurate localization of the target, and adequate minimize dose to the normal surrounding structures in close proximity to the target.  PLAN:  The patient will receive 60 Gy in 30 fractions initially. The patient will then receive a 6 Gy boost for a final dose of 66 Gy.  Special treatment  procedure The patient will also receive concurrent chemotherapy during the treatment. The patient may therefore experience increased toxicity or side effects and the patient will be monitored for such problems. This may require extra lab work as necessary. This therefore constitutes a special treatment procedure.   ________________________________   Jodelle Gross, MD, PhD

## 2019-04-03 NOTE — Progress Notes (Signed)
Initial Nutrition Assessment  DOCUMENTATION CODES:   Not applicable  INTERVENTION:  -Boost Breeze po TID, each supplement provides 250 kcal and 9 grams of protein  -Advance diet as medically feasible  NUTRITION DIAGNOSIS:   Swallowing difficulty related to cancer and cancer related treatments, mouth pain(lung cancer; currently undergoing chemo/radiation therapy) as evidenced by per patient/family report(pain with swallowing and diminished PO intake secondary to severe thrush).  GOAL:   Patient will meet greater than or equal to 90% of their needs    MONITOR:   PO intake, Labs, I & O's, Supplement acceptance, Weight trends, Diet advancement  REASON FOR ASSESSMENT:   Consult Malnutrition Eval  ASSESSMENT:  RD working remotely.  73 year old female with past medical history of  lymph node cancer, currently undergoing chemo/radiation lung cancer, HLD, HTN, COPD, emphysema who presented to ED with severe thrush and complains of pain with swallowing and diminished PO intake. Patient [facility] 11/29 with similar symptoms, but declined to be admitted because she was due for a radiation treatment.  11/25 chemo; Etoposide Day 3, Cycle 3 11/30 radiation  Patient currently on clear liquid diet, no recorded po intake for review. Will provide Boost Breeze to aid with calorie and protein needs. Highly suspect patient with malnutrition given severe weight losses noted below. RD attempted to reach patient via phone this afternoon to obtain nutrition history, unfortunately pt did not answer. Unable to diagnose malnutrition at this time. NFPE will be completed at follow up to assess for suspected fat/muscle depletion.   Current wt 47.6 kg (104.7 lb) Weight history reviewed; noted 10.4 lb (9%) wt loss in 2 months which is severe for time frame. On 9/22 pt wt 52.3 kg (115.1 lb), 10/12 pt wt 51 kg (112 lb), 11/23 pt wt 44.9 kg (98.8 lb)  Medications reviewed and include: Zetia, Folic  acid Azactam Diflucan Vancomycin Labs: K 3.2 (L), Corrected Ca for low albumin 8.7 (L), Phos 2 (L), Mg 1.3 (L), WBC 0.1 (L), Hgb 7.3 (L)  NUTRITION - FOCUSED PHYSICAL EXAM: Unable to complete at this time, RD working remotely.  Diet Order:   Diet Order            Diet clear liquid Room service appropriate? Yes; Fluid consistency: Thin  Diet effective now              EDUCATION NEEDS:   No education needs have been identified at this time  Skin:  Skin Assessment: Reviewed RN Assessment  Last BM:  12/1 (type 7;brown;small)  Height:   Ht Readings from Last 1 Encounters:  03/16/2019 5' 1.5" (1.562 m)    Weight:   Wt Readings from Last 1 Encounters:  03/04/2019 47.6 kg    Ideal Body Weight:  48 kg  BMI:  Body mass index is 19.52 kg/m.  Estimated Nutritional Needs:   Kcal:  1500-1700  Protein:  75-85  Fluid:  >/= 1.5 L/day   Lajuan Lines, RD, LDN Clinical Nutrition [place] Telephone [phone] After Hours/Weekend Pager: [phone]

## 2019-04-03 NOTE — Progress Notes (Signed)
Pharmacy Antibiotic Note  Stacy [name] is a 73 y.o. female admitted on 03/17/2019 with sepsis/esophageal candidiasis.  Pharmacy has been consulted for Vancomycin, aztreonam, fluconazole dosing.  Plan: Vanc 1g/Aztreonam 2g in ED for FN Vancomycin 750 mg IV Q 24 hrs. Goal AUC 400-550. Expected AUC: 487 SCr used: 0.8 (adjusted) Aztrenam 2gm iv q8hr   Fluconazole 400mg  iv x1, then 200mg  iv q24hr    Height: 5' 1.5" (156.2 cm) Weight: 105 lb (47.6 kg) IBW/kg (Calculated) : 48.95  Temp (24hrs), Avg:100.5 F (38.1 C), Min:98.7 F (37.1 C), Max:103.2 F (39.6 C)  Recent Labs  Lab 04/01/19 0952 04/01/19 1151 03/18/2019 1452 03/23/2019 1626 03/17/2019 1640 04/03/19 0216  WBC  --   --  0.1*  --  0.1* 0.1*  CREATININE 0.62  --  0.66  --  0.60 0.45  LATICACIDVEN 1.1 0.7  --  1.3 1.1  --     Estimated Creatinine Clearance: 46.4 mL/min (by C-G formula based on SCr of 0.45 mg/dL).    Allergies  Allergen Reactions  . Dilaudid [Hydromorphone Hcl] Shortness Of Breath and Swelling  . Fentanyl Shortness Of Breath and Swelling  . Penicillins Anaphylaxis    (Had skin test at [facility] Did it involve swelling of the face/tongue/throat, SOB, or low BP? Yes  +++CARDIAC ARREST+++ Did it involve sudden or severe rash/hives, skin peeling, or any reaction on the inside of your mouth or nose? No Did you need to seek medical attention at a hospital or doctor's office? Yes When did it last happen? Many years ago If all above answers are "NO", may proceed with cephalosporin use.   . Crestor [Rosuvastatin] Other (See Comments)    Joint pain, cramping     Antimicrobials this admission: Vancomycin 03/08/2019 >> Aztreonam 03/06/2019 >>  Fluconazole 03/22/2019 >>   Dose adjustments this admission: -  Microbiology results: -  Thank you for allowing pharmacy to be a part of this patient's care.  [name] 04/03/2019 4:45 AM

## 2019-04-03 NOTE — Evaluation (Signed)
Physical Therapy Evaluation Patient Details Name: Stacy [name] MRN: [id number] DOB: 1946-01-10 Today's Date: 04/03/2019   History of Present Illness  73 yo female admitted with neutropenic fever, severe thrush, weakness. Hx of lung ca, COPD  Clinical Impression  On eval, pt required Min assist +2 for safety for mobility. She was able to pivot, bed<>bsc, using RW. HR up to 145 bpm with activity. Pt is weak and fatigues fairly easily. Son was present during session. Discussed d/c plan-pt plans to return home with family assisting as needed. Will follow and progress activity as tolerated.     Follow Up Recommendations Home health PT;Supervision/Assistance - 24 hour    Equipment Recommendations  None recommended by PT    Recommendations for Other Services       Precautions / Restrictions Precautions Precautions: Fall Precaution Comments: monitor HR Restrictions Weight Bearing Restrictions: No      Mobility  Bed Mobility Overal bed mobility: Needs Assistance Bed Mobility: Supine to Sit     Supine to sit: Min assist;+2 for physical assistance;+2 for safety/equipment;HOB elevated Sit to supine: Min guard;HOB elevated   General bed mobility comments: assist for trunk elevation and to scoot to EOB. Pt able to bring LEs onto EOB when returning to supine  Transfers Overall transfer level: Needs assistance Equipment used: Rolling walker (2 wheeled) Transfers: Sit to/from [facility] Sit to Stand: Min assist;+2 physical assistance;+2 safety/equipment Stand pivot transfers: Min assist;+2 physical assistance;+2 safety/equipment       General transfer comment: Assist to rise, stabilize, control descent. VCs safety, hand placement. Stand pivot, bed<>bsc, with RW.  Ambulation/Gait             General Gait Details: NT on today-fatigued after transfers  Stairs            Wheelchair Mobility    Modified Rankin (Stroke Patients Only)       Balance  Overall balance assessment: Needs assistance         Standing balance support: Bilateral upper extremity supported Standing balance-Leahy Scale: Poor                               Pertinent Vitals/Pain Pain Assessment: Faces Faces Pain Scale: Hurts a little bit Pain Location: headache Pain Descriptors / Indicators: Headache Pain Intervention(s): Monitored during session    Home Living Family/patient expects to be discharged to:: Private residence Living Arrangements: Alone Available Help at Discharge: Family;Available 24 hours/day Type of Home: House Home Access: Ramped entrance     Home Layout: One level Home Equipment: Walker - 2 wheels;Cane - single point      Prior Function Level of Independence: Independent with assistive device(s)         Comments: typically ambulatory with SPC/RW and performing her own ADL, son/pt report few days leading up to admission pt was progressively weaker and unable to perform mobility/ADL without assist     Hand Dominance        Extremity/Trunk Assessment   Upper Extremity Assessment Upper Extremity Assessment: Defer to OT evaluation    Lower Extremity Assessment Lower Extremity Assessment: Generalized weakness    Cervical / Trunk Assessment Cervical / Trunk Assessment: Kyphotic  Communication   Communication: Other (comment)(speaking is difficult sometimes)  Cognition Arousal/Alertness: Awake/alert Behavior During Therapy: WFL for tasks assessed/performed Overall Cognitive Status: Within Functional Limits for tasks assessed  General Comments      Exercises     Assessment/Plan    PT Assessment Patient needs continued PT services  PT Problem List Decreased strength;Decreased mobility;Decreased activity tolerance;Decreased balance;Decreased knowledge of use of DME       PT Treatment Interventions DME instruction;Gait training;Therapeutic  exercise;Therapeutic activities;Patient/family education;Balance training;Functional mobility training    PT Goals (Current goals can be found in the Care Plan section)  Acute Rehab PT Goals Patient Stated Goal: regain strength PT Goal Formulation: With patient/family Time For Goal Achievement: 04/17/19 Potential to Achieve Goals: Fair    Frequency Min 3X/week   Barriers to discharge        Co-evaluation   Reason for Co-Treatment: For patient/therapist safety;To address functional/ADL transfers   OT goals addressed during session: ADL's and self-care       AM-PAC PT "6 Clicks" Mobility  Outcome Measure Help needed turning from your back to your side while in a flat bed without using bedrails?: A Little Help needed moving from lying on your back to sitting on the side of a flat bed without using bedrails?: A Lot Help needed moving to and from a bed to a chair (including a wheelchair)?: A Lot Help needed standing up from a chair using your arms (e.g., wheelchair or bedside chair)?: A Lot Help needed to walk in hospital room?: A Lot Help needed climbing 3-5 steps with a railing? : A Lot 6 Click Score: 13    End of Session Equipment Utilized During Treatment: Gait belt Activity Tolerance: Patient limited by fatigue Patient left: in bed;with call bell/phone within reach;with bed alarm set;with family/visitor present   PT Visit Diagnosis: Unsteadiness on feet (R26.81);Muscle weakness (generalized) (M62.81);Difficulty in walking, not elsewhere classified (R26.2)    Time: 8250-5397 PT Time Calculation (min) (ACUTE ONLY): 26 min   Charges:   PT Evaluation $PT Eval Moderate Complexity: [place], PT Acute Rehabilitation Services Pager: [phone] Office: [phone]

## 2019-04-03 NOTE — Progress Notes (Signed)
  Radiation Oncology         [phone] ________________________________  Name: Stacy [name] MRN: [id number]  Date: 02/02/2019  DOB: April 28, 1946  RESPIRATORY MOTION MANAGEMENT SIMULATION  NARRATIVE:  In order to account for effect of respiratory motion on target structures and other organs in the planning and delivery of radiotherapy, this patient underwent respiratory motion management simulation.  To accomplish this, when the patient was brought to the CT simulation planning suite, 4D respiratoy motion management CT images were obtained.  The CT images were loaded into the planning software.  Then, using a variety of tools including Cine, MIP, and standard views, the target volume and planning target volumes (PTV) were delineated.  Avoidance structures were contoured.  Treatment planning then occurred.  Dose volume histograms were generated and reviewed for each of the requested structure.  The resulting plan was carefully reviewed and approved today.   ------------------------------------------------  Jodelle Gross, MD, PhD

## 2019-04-03 NOTE — Evaluation (Signed)
Occupational Therapy Evaluation Patient Details Name: Stacy [name] MRN: [id number] DOB: 26-Feb-1946 Today's Date: 04/03/2019    History of Present Illness Stacy [name] is a 73 y.o. female with medical history significant of lung cancer, COPD, HLD, HTN who presented to ED with difficulty eating, weakness, and was noted to be febrile in the emergency room.  She has been receiving chemotherapy for limited stage small cell lung cancer.    Clinical Impression   This 73 y/o female presents with the above. PTA pt reports mod independence with ADL and functional mobility. Pt currently requiring minA+2 for functional transfers using RW; requires min-modA for toileting and LB ADL. Pt overall presenting with weakness, decreased activity tolerance. Pt on  2L O2 with SpO2 >95% throughout session; pt with elevated HR with all activity requiring seated rest breaks (max noted 145), BP 102/59 seated EOB. Pt will benefit from continued acute OT services and recommend follow up [facility] services after discharge to progress her towards her PLOF. Will follow.     Follow Up Recommendations  Home health OT;Supervision/Assistance - 24 hour    Equipment Recommendations  Tub/shower seat           Precautions / Restrictions Precautions Precautions: Fall Precaution Comments: monitor HR Restrictions Weight Bearing Restrictions: No      Mobility Bed Mobility Overal bed mobility: Needs Assistance Bed Mobility: Supine to Sit;Sit to Supine     Supine to sit: Min assist;+2 for safety/equipment Sit to supine: Min guard   General bed mobility comments: assist for trunk elevation, pt able to bring LEs onto EOB when returning to supine  Transfers Overall transfer level: Needs assistance Equipment used: Rolling walker (2 wheeled) Transfers: Sit to/from [facility] Sit to Stand: Min assist;+2 physical assistance;+2 safety/equipment Stand pivot transfers: Min assist;+2 physical assistance;+2  safety/equipment       General transfer comment: boosting and steadying assist throughout, VCs for sequencing steps with RW    Balance                                           ADL either performed or assessed with clinical judgement   ADL Overall ADL's : Needs assistance/impaired Eating/Feeding: Set up;Sitting   Grooming: Min guard;Set up;Sitting   Upper Body Bathing: Min guard;Sitting   Lower Body Bathing: Minimal assistance;+2 for physical assistance;+2 for safety/equipment;Sit to/from stand   Upper Body Dressing : Min guard;Sitting   Lower Body Dressing: Moderate assistance;+2 for physical assistance;+2 for safety/equipment;Sit to/from stand   Toilet Transfer: Minimal assistance;+2 for physical assistance;+2 for safety/equipment;Stand-pivot;RW;BSC   Toileting- Clothing Manipulation and Hygiene: Moderate assistance;+2 for physical assistance;+2 for safety/equipment;Sit to/from stand Toileting - Clothing Manipulation Details (indicate cue type and reason): assist for balance in standing, pt able to perform pericare with steadying assist     Functional mobility during ADLs: Minimal assistance;+2 for physical assistance;+2 for safety/equipment;Rolling walker General ADL Comments: pt limited due to weakness, fatigue, pt also with elevated HR with minimal activity     Vision         Perception     Praxis      Pertinent Vitals/Pain Pain Assessment: Faces Faces Pain Scale: Hurts little more Pain Location: headache Pain Descriptors / Indicators: Headache Pain Intervention(s): Limited activity within patient's tolerance;Monitored during session     Hand Dominance     Extremity/Trunk Assessment Upper Extremity Assessment Upper Extremity Assessment:  Generalized weakness   Lower Extremity Assessment Lower Extremity Assessment: Defer to PT evaluation       Communication Communication Communication: No difficulties;Other (comment)(fatigues with  speaking for prolonged periods)   Cognition Arousal/Alertness: Awake/alert Behavior During Therapy: WFL for tasks assessed/performed Overall Cognitive Status: Within Functional Limits for tasks assessed                                     General Comments       Exercises     Shoulder Instructions      Home Living Family/patient expects to be discharged to:: Private residence Living Arrangements: Alone Available Help at Discharge: Family;Available 24 hours/day Type of Home: House Home Access: Ramped entrance     Home Layout: One level         Bathroom Toilet: Standard     Home Equipment: Walker - 2 wheels;Cane - single point          Prior Functioning/Environment Level of Independence: Independent with assistive device(s)        Comments: typically ambulatory with SPC/RW and performing her own ADL, son/pt report few days leading up to admission pt was progressively weaker and unable to perform mobility/ADL without assist        OT Problem List: Decreased strength;Decreased range of motion;Decreased activity tolerance;Impaired balance (sitting and/or standing);Decreased knowledge of use of DME or AE;Cardiopulmonary status limiting activity;Pain      OT Treatment/Interventions: Self-care/ADL training;Therapeutic exercise;Energy conservation;DME and/or AE instruction;Therapeutic activities;Patient/family education;Balance training    OT Goals(Current goals can be found in the care plan section) Acute Rehab OT Goals Patient Stated Goal: regain strength OT Goal Formulation: With patient Time For Goal Achievement: 04/17/19 Potential to Achieve Goals: Good  OT Frequency: Min 2X/week   Barriers to D/C:            Co-evaluation PT/OT/SLP Co-Evaluation/Treatment: Yes Reason for Co-Treatment: For patient/therapist safety;To address functional/ADL transfers   OT goals addressed during session: ADL's and self-care      AM-PAC OT "6 Clicks" Daily  Activity     Outcome Measure Help from another person eating meals?: None Help from another person taking care of personal grooming?: A Little Help from another person toileting, which includes using toliet, bedpan, or urinal?: A Little Help from another person bathing (including washing, rinsing, drying)?: A Lot Help from another person to put on and taking off regular upper body clothing?: None Help from another person to put on and taking off regular lower body clothing?: A Lot 6 Click Score: 18   End of Session Equipment Utilized During Treatment: Gait belt;Oxygen;Rolling walker Nurse Communication: Mobility status  Activity Tolerance: Patient tolerated treatment well Patient left: in bed;with call bell/phone within reach;with bed alarm set;with family/visitor present  OT Visit Diagnosis: Unsteadiness on feet (R26.81);Muscle weakness (generalized) (M62.81)                Time: 4627-0350 OT Time Calculation (min): 28 min Charges:  OT General Charges $OT Visit: 1 Visit OT Evaluation $OT Eval Moderate Complexity: [place], OT [employer] Pager [phone] Office [place] 04/03/2019, 1:13 PM

## 2019-04-03 NOTE — Progress Notes (Signed)
PROGRESS NOTE    Stacy [name]  MEQ:[id number]  DOB: April 01, 1946  DOA: 03/21/2019 PCP: [name], MD  Brief Narrative:  73 y.o. female with medical history significant of lung cancer on radiation therapy and chemotherapy (last session on 11/27 received etoposide and  Neulasta  ), COPD, HLD, HTN presented with odonophagia due to severe oral thrush as well as fever. She reports chronic cough and outpatient attempts to reduced antihypertensives in concern for low BP ED course: Tmax 103.51F, BP ranging 87/47--140/70, hr 90-124, RR 14-23. Labs significant for WBC 0.1, Hgb 7.7, Platelets 36, chl 95, sodium 132, glucose 120, lactate 0.7. COVID -ve. CXR showed-no active cardiopulmonary disease. Emphysematous disease and chronic interstitial opacities Recent CT on 11/27 had shown improved size of lung nodules and LN. U/A unremarkable. Blood cx sent. Patient recieved vanc and aztreonam. Case was discussed by ED physician with Dr Irene Limbo who recommended admit to medicine for IV ABX and IVF. No need for neutrophil stimulating factors Hospital course: Admitted to [facility] service with IV fluids and  broad spectrum antibiotics (Vanc/Aztreonam/Fluconazole/Flagyl ) for neutropenic fever/ severe mucositis. Blood cx so far no growth, MRSA PCR -ve. Patient afebrile this morning. Admitting provider notified Dr Inda Merlin regarding patient's presentation/admission. Pharmacy recommended d/c flagyl   Subjective:  Patient resting comfortably.  Son at bedside.  Noted to have blood pressure in systolic 29N to low 98X.  Patient denies any dizziness.  Son reports normal temp at home few hours before presenting to the ED.  He however states initial temp recorded in the ED was a rectal temp.  Objective: Vitals:   04/03/19 0730 04/03/19 0745 04/03/19 0800 04/03/19 0815  BP: 113/61 (!) 95/55 (!) 94/52 (!) 89/54  Pulse: (!) 115 (!) 116 (!) 116 (!) 116  Resp: 20 17 17 17   Temp: 99.1 F (37.3 C)     TempSrc: Axillary      SpO2: 100% 99% 98% 100%  Weight:      Height:        Intake/Output Summary (Last 24 hours) at 04/03/2019 0855 Last data filed at 04/03/2019 2119 Gross per 24 hour  Intake 3176.04 ml  Output 102 ml  Net 3074.04 ml   Filed Weights   04/01/2019 1542 03/18/2019 1546  Weight: 47.6 kg 47.6 kg    Physical Examination:  General exam: Appears calm and comfortable  Respiratory system: Clear to auscultation. Respiratory effort normal. Cardiovascular system: S1 & S2 heard, RRR. No JVD, murmurs, rubs, gallops or clicks. No pedal edema. Gastrointestinal system: Abdomen is nondistended, soft and nontender. No organomegaly or masses felt. Normal bowel sounds heard. Central nervous system: Alert and oriented. No new focal neurological deficits. Extremities: No contractures, edema or joint deformities.  Skin: No rashes, lesions or ulcers Psychiatry: Judgement and insight appear normal. Mood & affect appropriate.   Data Reviewed: I have personally reviewed following labs and imaging studies  CBC: Recent Labs  Lab 03/30/2019 1452 03/20/2019 1640 04/03/19 0216  WBC 0.1* 0.1* 0.1*  NEUTROABS  --  0.0*  --   HGB 7.8* 7.7* 7.3*  HCT 22.6* 22.5* 21.9*  MCV 95.0 96.2 98.6  PLT 40* 36* 23*   Basic Metabolic Panel: Recent Labs  Lab 04/01/19 0952 03/28/2019 1452 03/10/2019 1640 04/03/19 0216  NA 131* 132* 132* 135  K 3.3* 3.5 3.3* 3.2*  CL 91* 95* 95* 104  CO2 27 26 25 22   GLUCOSE 112* 120* 122* 109*  BUN 10 9 11 8   CREATININE 0.62 0.66 0.60 0.45  CALCIUM 9.1 8.7* 8.6* 7.7*  MG  --   --   --  1.3*  PHOS  --   --   --  2.0*   GFR: Estimated Creatinine Clearance: 46.4 mL/min (by C-G formula based on SCr of 0.45 mg/dL). Liver Function Tests: Recent Labs  Lab 03/06/2019 1452 03/05/2019 1640 04/03/19 0216  AST 12* 13* 12*  ALT 11 14 12   ALKPHOS 60 53 53  BILITOT 0.8 1.1 0.5  PROT 6.4* 6.8 5.8*  ALBUMIN 3.0* 3.2* 2.7*   No results for input(s): LIPASE, AMYLASE in the last 168 hours. No  results for input(s): AMMONIA in the last 168 hours. Coagulation Profile: Recent Labs  Lab 03/14/2019 1640  INR 1.2   Cardiac Enzymes: No results for input(s): CKTOTAL, CKMB, CKMBINDEX, TROPONINI in the last 168 hours. BNP (last 3 results) No results for input(s): PROBNP in the last 8760 hours. HbA1C: No results for input(s): HGBA1C in the last 72 hours. CBG: No results for input(s): GLUCAP in the last 168 hours. Lipid Profile: No results for input(s): CHOL, HDL, LDLCALC, TRIG, CHOLHDL, LDLDIRECT in the last 72 hours. Thyroid Function Tests: Recent Labs    04/03/19 0216  TSH 1.885   Anemia Panel: No results for input(s): VITAMINB12, FOLATE, FERRITIN, TIBC, IRON, RETICCTPCT in the last 72 hours. Sepsis Labs: Recent Labs  Lab 04/01/19 6237 04/01/19 1151 03/23/2019 1626 03/31/2019 1640  PROCALCITON  --   --   --  <0.10  LATICACIDVEN 1.1 0.7 1.3 1.1    Recent Results (from the past 240 hour(s))  SARS CORONAVIRUS 2 (TAT 6-24 HRS) Nasopharyngeal Nasopharyngeal Swab     Status: None   Collection Time: 04/01/19  9:38 AM   Specimen: Nasopharyngeal Swab  Result Value Ref Range Status   SARS Coronavirus 2 NEGATIVE NEGATIVE Final    Comment: (NOTE) SARS-CoV-2 target nucleic acids are NOT DETECTED. The SARS-CoV-2 RNA is generally detectable in upper and lower respiratory specimens during the acute phase of infection. Negative results do not preclude SARS-CoV-2 infection, do not rule out co-infections with other pathogens, and should not be used as the sole basis for treatment or other patient management decisions. Negative results must be combined with clinical observations, patient history, and epidemiological information. The expected result is Negative. Fact Sheet for Patients: SugarRoll.be Fact Sheet for Healthcare Providers: https://www.woods-mathews.com/ This test is not yet approved or cleared by the [place] FDA and  has been  authorized for detection and/or diagnosis of SARS-CoV-2 by FDA under an Emergency Use Authorization (EUA). This EUA will remain  in effect (meaning this test can be used) for the duration of the COVID-19 declaration under Section 56 4(b)(1) of the Act, 21 U.S.C. section 360bbb-3(b)(1), unless the authorization is terminated or revoked sooner. Performed at [facility], [place] [address] [place], [place] [address]   Culture, blood (routine x 2)     Status: None (Preliminary result)   Collection Time: 04/01/19  9:43 AM   Specimen: BLOOD RIGHT WRIST  Result Value Ref Range Status   Specimen Description BLOOD RIGHT WRIST BOTTLES DRAWN AEROBIC ONLY  Final   Special Requests Blood Culture adequate volume  Final   Culture   Final    NO GROWTH 2 DAYS Performed at [facility], [address], [place], [place] [address]    Report Status PENDING  Incomplete  Culture, blood (routine x 2)     Status: None (Preliminary result)   Collection Time: 04/01/19  9:52 AM  Specimen: BLOOD RIGHT ARM  Result Value Ref Range Status   Specimen Description   Final    BLOOD RIGHT ARM BOTTLES DRAWN AEROBIC AND ANAEROBIC   Special Requests Blood Culture adequate volume  Final   Culture   Final    NO GROWTH 2 DAYS Performed at [facility], [address] [place], [place] [address]    Report Status PENDING  Incomplete  Culture, blood (Routine x 2)     Status: None (Preliminary result)   Collection Time: 03/16/2019  4:40 PM   Specimen: BLOOD RIGHT FOREARM  Result Value Ref Range Status   Specimen Description   Final    BLOOD RIGHT FOREARM Performed at [place] [place] [address] [place], [place] [address]    Special Requests   Final    BOTTLES DRAWN AEROBIC AND ANAEROBIC Blood Culture adequate volume Performed at [place] [address], [place], [place] [address]    Culture   Final    NO GROWTH < 24 HOURS Performed at [place] [address], [place], [place] [address]    Report Status PENDING  Incomplete  Culture, blood (Routine x 2)     Status: None (Preliminary result)   Collection Time: 03/17/2019  4:40 PM   Specimen: BLOOD LEFT FOREARM  Result Value Ref Range Status   Specimen Description   Final    BLOOD LEFT FOREARM Performed at [facility] [address], [place], [place] [address]    Special Requests   Final    BOTTLES DRAWN AEROBIC AND ANAEROBIC Blood Culture adequate volume Performed at [place] [facility] [address], [place], [place] [address]    Culture   Final    NO GROWTH < 24 HOURS Performed at [facility] [address], [place], [place] [address]    Report Status PENDING  Incomplete  Respiratory Panel by PCR     Status: None   Collection Time: 03/20/2019  8:35 PM   Specimen: Nasopharyngeal Swab; Respiratory  Result Value Ref Range Status   Adenovirus NOT DETECTED NOT DETECTED Final   Coronavirus 229E NOT DETECTED NOT DETECTED Final    Comment: (NOTE) The Coronavirus on the Respiratory Panel, DOES NOT test for the novel  Coronavirus (2019 nCoV)    Coronavirus HKU1 NOT DETECTED NOT DETECTED Final   Coronavirus NL63 NOT DETECTED NOT DETECTED Final   Coronavirus OC43 NOT DETECTED NOT DETECTED Final   Metapneumovirus NOT DETECTED NOT DETECTED Final   Rhinovirus / Enterovirus NOT DETECTED NOT DETECTED Final   Influenza A NOT DETECTED NOT DETECTED Final   Influenza B NOT DETECTED NOT DETECTED Final   Parainfluenza Virus 1 NOT DETECTED NOT DETECTED Final   Parainfluenza Virus 2 NOT DETECTED NOT DETECTED Final   Parainfluenza Virus 3 NOT DETECTED NOT DETECTED Final   Parainfluenza Virus 4 NOT DETECTED NOT DETECTED Final   Respiratory Syncytial Virus NOT DETECTED NOT DETECTED Final   Bordetella pertussis NOT DETECTED NOT DETECTED Final   Chlamydophila pneumoniae NOT DETECTED NOT DETECTED Final   Mycoplasma pneumoniae NOT DETECTED NOT DETECTED Final    Comment:  Performed at [facility], [place]. [address], [place],  [address]  MRSA PCR Screening     Status: None   Collection Time: 04/03/19 12:22 AM   Specimen: Nasal Mucosa; Nasopharyngeal  Result Value Ref Range Status   MRSA by PCR NEGATIVE NEGATIVE Final    Comment:  The GeneXpert MRSA Assay (FDA approved for NASAL specimens only), is one component of a comprehensive MRSA colonization surveillance program. It is not intended to diagnose MRSA infection nor to guide or monitor treatment for MRSA infections. Performed at [facility], [place] [address], [place], [place] [address]       Radiology Studies: Dg Chest 2 View  Result Date: 03/12/2019 CLINICAL DATA:  Shortness of breath and chest pain EXAM: CHEST - 2 VIEW COMPARISON:  04/01/2019, 01/22/2019 FINDINGS: Left-sided central venous port with tip over the SVC. Emphysematous disease. Stable to slight decreased subpleural right upper lobe lung mass. Chronic interstitial changes. No acute consolidation or effusion. Normal heart size. Aortic atherosclerosis. No pneumothorax. IMPRESSION: No active cardiopulmonary disease. Emphysematous disease and chronic interstitial opacities. No acute airspace disease. Stable to slight decreased subpleural right upper lobe lung mass Electronically Signed   By: Donavan Foil M.D.   On: 04/01/2019 18:09   Dg Chest Port 1 View  Result Date: 04/01/2019 CLINICAL DATA:  Weakness and dehydration.  History of lung carcinoma EXAM: PORTABLE CHEST 1 VIEW COMPARISON:  Chest CT March 30, 2019 and chest radiograph February 19, 2019. FINDINGS: Underlying emphysematous change noted. Nodular lesion in the periphery of the right upper lobe measuring 1.5 x 1.7 cm is noted, better seen on recent CT. There are scattered areas of fibrosis. No edema or consolidation evident. Heart size and pulmonary vascularity are normal. No adenopathy. Port-A-Cath tip is in the superior vena cava. There is aortic  atherosclerosis. No evident bone lesions. IMPRESSION: Underlying emphysematous change in fibrosis. Nodular opacity in the periphery of the right upper lobe again noted. No edema or consolidation. Stable cardiac silhouette. No adenopathy. Port-A-Cath tip in superior vena cava. Aortic Atherosclerosis (ICD10-I70.0). Electronically Signed   By: Lowella Grip III M.D.   On: 04/01/2019 09:49        Scheduled Meds: . albuterol  2 puff Inhalation TID  . aspirin EC  162 mg Oral Daily  . Chlorhexidine Gluconate Cloth  6 each Topical Daily  . ezetimibe  10 mg Oral Daily  . folic acid  1 mg Intravenous Daily   Continuous Infusions: . sodium chloride 999 mL/hr at 04/03/19 0637  . aztreonam 2 g (04/03/19 0818)  . fluconazole (DIFLUCAN) IV    . metronidazole Stopped (04/03/19 1962)  . vancomycin      Assessment & Plan:   1. Neutropenic fever: Could be related to line infection versus mucositis versus UTI. On broad spectrum abx-aztreonam/Diflucan/vancomycin. D/c flagyl. MRSA PCR -ve, ?d/c vancomycin--discussed with ID, Dr. Graylon Good, who recommended to continue for now and she will evaluate patient formally in a.m. F/U Blood cx and urine cx reports  Also ordered stool for C. difficile to be sent if patient has loose stools.  No BM today.  Appreciate hematology/oncology recommendations-they did not recommend additional G-CSF, last received Neulasta on 11/27.  2. Dysphagia/ Oral candidiasis: On flucanozole. D/c flagyl. Magic mouthwash for pain relief  3.  Hypotension: In the setting of problem #1.  Patient also reports running low blood pressure at baseline.  She apparently has been on beta-blockers for a long time for migraine prophylaxis and was recently prescribed nitrates for angina.  Her blood pressure did drop to systolic 22L earlier today.  Improved with 1 L of fluid bolus and 1 dose of midodrine.  She is also receiving blood transfusion now.  Will avoid further doses of midodrine until history of  angina clarified with primary cardiologist.  Will order  maintenance fluids after blood transfusion completed.  4. Small cell Lung ca : T3, N3, M0- with right upper lobe lung mass in addition to satellite nodule as well as right hilar, mediastinal and right supraclavicular lymphadenopathy diagnosed in September 2020.  On Concurrent chemoradiation with carboplatin, etoposide with neulasta support. First cycle on 02/12/2019. Last session on 11/27.  Appreciate oncology evaluation.  5. Pancytopenia: in the setting of chemotherapy/radiation. No signs of bleeding. Monitor hgb--noted hematology has orders for blood transfusion. Noted to be on ASA--not sure if she needs this--states had an abnormal stress test in the recent past by Dr. Virgina Jock.  6. CAD/angina: At home on aspirin as well as atenlol/imdur--being titrated as outpatient.Patient reports following Dr. Earnie Larsson as outpatient and apparently was using aspirin for many years as a preventive measure as well as atenolol for migraines but was also evaluated by [facility] cardiology for angina episodes with abnormal stress test, was advised to use nitrates but not felt to be a candidate for cardiac cath. medium sized, medium intensity,reversible perfusion defect in basal to apical inferior/ inferolateral myocardium.All segments of left ventricle demonstrated normal wall motion and thickening. Stress LV EF is normal 69%. Will hold asa for now as d/w Hematology APP given severe thrombocytopenia and anemia requiring transfusion.  Will let cardiology know.   7.COPD/Emphysema : Reports recent smoking cessation. Nebs prn  8. Hyperlipidemia: resumed Zetia.  HTN--now hypotensive.  , hold for now  9. Hypokalemia: Replace p.o./IV as needed.   DVT prophylaxis: SCD Code Status: DNR Family / Patient Communication: d/w patient and son-Travis at bedside. D/W ID Dr Baxter Flattery and Oncology APP  Disposition Plan: TBD     LOS: 0 days    Time spent:     Guilford Shi, MD [facility] Pager [phone]  If 7PM-7AM, please contact night-coverage [email or web address] Password [email or web address] 04/03/2019, 8:55 AM

## 2019-04-03 NOTE — Progress Notes (Signed)
Blood transfusion started at 1221, Pt BP 127/54. After the 15 minute start, BP was 75/46. Blood transfusion stopped and Dr. Earnest Conroy notified. Pt reports having low BP intermediately. Pt reports not feeling any different and all other vitals are the same as earlier. MD ordered a 250 cc bolus and to continue blood transfusion.

## 2019-04-03 DEATH — deceased

## 2019-04-04 ENCOUNTER — Ambulatory Visit: Payer: Medicare HMO

## 2019-04-04 ENCOUNTER — Inpatient Hospital Stay (HOSPITAL_COMMUNITY): Payer: Medicare HMO

## 2019-04-04 DIAGNOSIS — I200 Unstable angina: Secondary | ICD-10-CM

## 2019-04-04 DIAGNOSIS — R0600 Dyspnea, unspecified: Secondary | ICD-10-CM

## 2019-04-04 DIAGNOSIS — D61818 Other pancytopenia: Secondary | ICD-10-CM

## 2019-04-04 DIAGNOSIS — J9819 Other pulmonary collapse: Secondary | ICD-10-CM

## 2019-04-04 DIAGNOSIS — I4891 Unspecified atrial fibrillation: Secondary | ICD-10-CM

## 2019-04-04 DIAGNOSIS — B370 Candidal stomatitis: Secondary | ICD-10-CM

## 2019-04-04 LAB — TYPE AND SCREEN
ABO/RH(D): O NEG
Antibody Screen: NEGATIVE
Unit division: 0

## 2019-04-04 LAB — COMPREHENSIVE METABOLIC PANEL
ALT: 12 U/L (ref 0–44)
AST: 21 U/L (ref 15–41)
Albumin: 2 g/dL — ABNORMAL LOW (ref 3.5–5.0)
Alkaline Phosphatase: 42 U/L (ref 38–126)
Anion gap: 10 (ref 5–15)
BUN: 7 mg/dL — ABNORMAL LOW (ref 8–23)
CO2: 23 mmol/L (ref 22–32)
Calcium: 7.8 mg/dL — ABNORMAL LOW (ref 8.9–10.3)
Chloride: 103 mmol/L (ref 98–111)
Creatinine, Ser: 0.44 mg/dL (ref 0.44–1.00)
GFR calc Af Amer: >60 mL/min (ref >60)
GFR calc non Af Amer: >60 mL/min (ref >60)
Glucose, Bld: 96 mg/dL (ref 70–99)
Potassium: 3 mmol/L — ABNORMAL LOW (ref 3.5–5.1)
Sodium: 136 mmol/L (ref 135–145)
Total Bilirubin: 0.9 mg/dL (ref 0.3–1.2)
Total Protein: 5.2 g/dL — ABNORMAL LOW (ref 6.5–8.1)

## 2019-04-04 LAB — BPAM RBC
Blood Product Expiration Date: 202012272359
ISSUE DATE / TIME: 202012011216
Unit Type and Rh: 9500

## 2019-04-04 LAB — CBC WITH DIFFERENTIAL/PLATELET
HCT: 20 % — ABNORMAL LOW (ref 36.0–46.0)
Hemoglobin: 6.7 g/dL — CL (ref 12.0–15.0)
MCH: 31.9 pg (ref 26.0–34.0)
MCHC: 33.5 g/dL (ref 30.0–36.0)
MCV: 95.2 fL (ref 80.0–100.0)
Platelets: 4 10*3/uL — CL (ref 150–400)
RBC: 2.1 MIL/uL — ABNORMAL LOW (ref 3.87–5.11)
RDW: 14 % (ref 11.5–15.5)
WBC: 0.1 10*3/uL — CL (ref 4.0–10.5)
nRBC: 0 % (ref 0.0–0.2)

## 2019-04-04 LAB — BLOOD GAS, ARTERIAL
Acid-base deficit: 2.4 mmol/L — ABNORMAL HIGH (ref 0.0–2.0)
Bicarbonate: 21.3 mmol/L (ref 20.0–28.0)
O2 Content: 6 L/min
O2 Saturation: 96.9 %
Patient temperature: 98.6
pCO2 arterial: 34.9 mmHg (ref 32.0–48.0)
pH, Arterial: 7.402 (ref 7.350–7.450)
pO2, Arterial: 92.7 mmHg (ref 83.0–108.0)

## 2019-04-04 MED ORDER — POLYVINYL ALCOHOL 1.4 % OP SOLN: 1.0000 [drp] | Freq: Four times a day (QID) | OPHTHALMIC | Status: DC | PRN

## 2019-04-04 MED ORDER — MORPHINE SULFATE (PF) 2 MG/ML IV SOLN
2.0000 mg | INTRAVENOUS | Status: DC | PRN
  Administered 2019-04-04: 16:00:00 2 mg via INTRAVENOUS
  Filled 2019-04-04: qty 1

## 2019-04-04 MED ORDER — POTASSIUM CHLORIDE 20 MEQ PO PACK: 40.0000 meq | PACK | Freq: Once | ORAL | Status: DC

## 2019-04-04 MED ORDER — HALOPERIDOL 0.5 MG PO TABS: 0.5000 mg | ORAL_TABLET | ORAL | Status: DC | PRN

## 2019-04-04 MED ORDER — GLYCOPYRROLATE 1 MG PO TABS: 1.0000 mg | ORAL_TABLET | ORAL | Status: DC | PRN

## 2019-04-04 MED ORDER — BIOTENE DRY MOUTH MT LIQD: 15.0000 mL | OROMUCOSAL | Status: DC | PRN

## 2019-04-04 MED ORDER — IPRATROPIUM-ALBUTEROL 0.5-2.5 (3) MG/3ML IN SOLN: 3.0000 mL | RESPIRATORY_TRACT | Status: DC | PRN

## 2019-04-04 MED ORDER — HALOPERIDOL LACTATE 5 MG/ML IJ SOLN: 0.5000 mg | INTRAMUSCULAR | Status: DC | PRN

## 2019-04-04 MED ORDER — GLYCOPYRROLATE 0.2 MG/ML IJ SOLN
0.2000 mg | INTRAMUSCULAR | Status: DC | PRN
  Administered 2019-04-04 – 2019-04-06 (×5): 0.2 mg via INTRAVENOUS
  Filled 2019-04-04 (×5): qty 1

## 2019-04-04 MED ORDER — METOPROLOL TARTRATE 5 MG/5ML IV SOLN
INTRAVENOUS | Status: AC
  Administered 2019-04-04: 2.5 mg
  Filled 2019-04-04: qty 5

## 2019-04-04 MED ORDER — HALOPERIDOL LACTATE 2 MG/ML PO CONC
0.5000 mg | ORAL | Status: DC | PRN
  Filled 2019-04-04: qty 0.3

## 2019-04-04 MED ORDER — LORAZEPAM 2 MG/ML IJ SOLN
1.0000 mg | INTRAMUSCULAR | Status: DC | PRN
  Administered 2019-04-04 – 2019-04-06 (×6): 1 mg via INTRAVENOUS
  Filled 2019-04-04 (×6): qty 1

## 2019-04-04 MED ORDER — SODIUM CHLORIDE 0.9 % IV SOLN
INTRAVENOUS | Status: DC
  Administered 2019-04-04: 10:00:00 via INTRAVENOUS

## 2019-04-04 MED ORDER — MIDODRINE HCL 5 MG PO TABS: 2.5000 mg | ORAL_TABLET | Freq: Two times a day (BID) | ORAL | Status: DC | PRN

## 2019-04-04 MED ORDER — MORPHINE SULFATE (PF) 2 MG/ML IV SOLN
2.0000 mg | INTRAVENOUS | Status: DC | PRN
  Administered 2019-04-04 – 2019-04-06 (×9): 2 mg via INTRAVENOUS
  Filled 2019-04-04 (×4): qty 1
  Filled 2019-04-04: qty 2
  Filled 2019-04-04 (×4): qty 1

## 2019-04-04 MED ORDER — SODIUM CHLORIDE 0.9 % IV SOLN
500.0000 mL | Freq: Once | INTRAVENOUS | Status: AC
  Administered 2019-04-04: 04:00:00 500 mL via INTRAVENOUS

## 2019-04-04 MED ORDER — ORAL CARE MOUTH RINSE
15.0000 mL | Freq: Two times a day (BID) | OROMUCOSAL | Status: DC
  Administered 2019-04-04 (×2): 15 mL via OROMUCOSAL

## 2019-04-04 MED ORDER — GLYCOPYRROLATE 0.2 MG/ML IJ SOLN: 0.2000 mg | INTRAMUSCULAR | Status: DC | PRN

## 2019-04-04 NOTE — Progress Notes (Addendum)
CRITICAL VALUE ALERT  Critical Value:  Hgb 6.7, WBC 0.1, Platelets 4  Date & Time Notied:  04/11/2019 1315  Provider Notified: Dr. Earnest Conroy  Orders Received/Actions taken: Transitioning to comfort care. See new orders

## 2019-04-04 NOTE — Progress Notes (Addendum)
PROGRESS NOTE    Stacy [name]  GYJ:[id number]  DOB: 02-23-1946  DOA: 03/04/2019 PCP: [name], MD  Brief Narrative:  73 y.o. female with medical history significant of lung cancer on radiation therapy and chemotherapy (last session on 11/27 received etoposide and  Neulasta  ), COPD, HLD, HTN presented with odonophagia due to severe oral thrush as well as fever. She reports chronic cough and outpatient attempts to reduced antihypertensives in concern for low BP ED course: Tmax 103.21F, BP ranging 87/47--140/70, hr 90-124, RR 14-23. Labs significant for WBC 0.1, Hgb 7.7, Platelets 36, chl 95, sodium 132, glucose 120, lactate 0.7. COVID -ve. CXR showed-no active cardiopulmonary disease. Emphysematous disease and chronic interstitial opacities Recent CT on 11/27 had shown improved size of lung nodules and LN. U/A unremarkable. Blood cx sent. Patient recieved vanc and aztreonam. Case was discussed by ED physician with Dr Irene Limbo who recommended admit to medicine for IV ABX and IVF. No need for neutrophil stimulating factors Hospital course: Admitted to [facility] service with IV fluids and  broad spectrum antibiotics (Vanc/Aztreonam/Fluconazole/Flagyl ) for neutropenic fever/ severe mucositis. Blood cx so far no growth, MRSA PCR -ve. Patient afebrile . Admitting provider notified Dr Inda Merlin regarding patient's presentation/admission. Pharmacy recommended d/c flagyl .  Discussions held with infectious diseases on call, oncology teamas well as patient's primary cardiologist Dr. Virgina Jock  on 12/1 (given episodes of hypotension requiring IV hydration/midodrine while holding aspirin/nitrates/beta-blockers in concern for thrombocytopenia and hypotension).  Patient required IV hydration as well as midodrine as needed to maintain blood pressure on 12/1.  She also received 1 unit of PRBC as ordered by oncology team.  Subjective:  Patient this morning noted to be hypoxic requiring 4 L of O2 and also complained  of chest pain requiring nitro x1.  Chest x-ray ordered, EKG showed sinus tachycardia.  Later in the afternoon patient again had episode of chest burning associated with tachycardia/new onset A. fib with RVR with heart rate 170-200s requiring metoprolol IV 2.5 mg x 1..  Her oxygen requirement also went up to 6l Objective: Vitals:   04/17/2019 0600 05/03/2019 0800 05/03/2019 1300 04/08/2019 1431  BP: (!) 89/55 (!) 91/53    Pulse: (!) 116 (!) 114    Resp: 16 15    Temp:  98.8 F (37.1 C) 99.1 F (37.3 C)   TempSrc:  Axillary Axillary   SpO2: 100% 100%  97%  Weight:      Height:        Intake/Output Summary (Last 24 hours) at 04/03/2019 1519 Last data filed at 04/11/2019 2637 Gross per 24 hour  Intake 1173.53 ml  Output --  Net 1173.53 ml   Filed Weights   03/16/2019 1542 03/26/2019 1546  Weight: 47.6 kg 47.6 kg    Physical Examination:  General exam: Cachectic female, appears tachypneic, reports chest tightness/pain and wants additional dose of morphine Respiratory system: Reduced breath sounds on the right side compared to left Cardiovascular system: S1 & S2 heard, irregular, tachycardic. No JVD, murmurs. No pedal edema. Gastrointestinal system: Abdomen is nondistended, soft and nontender. No organomegaly or masses felt. Normal bowel sounds heard. Central nervous system: Alert and oriented x3. No new focal neurological deficits. Extremities: No contractures, edema or joint deformities.  Psychiatry: Judgement and insight appear normal. Mood & affect appropriate.   Data Reviewed: I have personally reviewed following labs and imaging studies  CBC: Recent Labs  Lab 03/31/2019 1452 03/27/2019 1640 04/03/19 0216 04/03/19 1629 04/03/19 1805 04/20/2019 1224  WBC 0.1*  0.1* 0.1*  --   --  0.1*  NEUTROABS  --  0.0*  --   --   --   --   HGB 7.8* 7.7* 7.3* 7.0* 7.4* 6.7*  HCT 22.6* 22.5* 21.9* 20.8* 21.5* 20.0*  MCV 95.0 96.2 98.6  --   --  95.2  PLT 40* 36* 23*  --   --  4*   Basic Metabolic  Panel: Recent Labs  Lab 04/01/19 0952 03/05/2019 1452 03/15/2019 1640 04/03/19 0216 04/03/19 1629 04/03/19 1805 04/14/2019 1224  NA 131* 132* 132* 135  --   --  136  K 3.3* 3.5 3.3* 3.2* 3.1* 3.2* 3.0*  CL 91* 95* 95* 104  --   --  103  CO2 27 26 25 22   --   --  23  GLUCOSE 112* 120* 122* 109*  --   --  96  BUN 10 9 11 8   --   --  7*  CREATININE 0.62 0.66 0.60 0.45  --   --  0.44  CALCIUM 9.1 8.7* 8.6* 7.7*  --   --  7.8*  MG  --   --   --  1.3*  --   --   --   PHOS  --   --   --  2.0*  --   --   --    GFR: Estimated Creatinine Clearance: 46.4 mL/min (by C-G formula based on SCr of 0.44 mg/dL). Liver Function Tests: Recent Labs  Lab 03/04/2019 1452 03/19/2019 1640 04/03/19 0216 04/14/2019 1224  AST 12* 13* 12* 21  ALT 11 14 12 12   ALKPHOS 60 53 53 42  BILITOT 0.8 1.1 0.5 0.9  PROT 6.4* 6.8 5.8* 5.2*  ALBUMIN 3.0* 3.2* 2.7* 2.0*   No results for input(s): LIPASE, AMYLASE in the last 168 hours. No results for input(s): AMMONIA in the last 168 hours. Coagulation Profile: Recent Labs  Lab 03/06/2019 1640  INR 1.2   Cardiac Enzymes: No results for input(s): CKTOTAL, CKMB, CKMBINDEX, TROPONINI in the last 168 hours. BNP (last 3 results) No results for input(s): PROBNP in the last 8760 hours. HbA1C: No results for input(s): HGBA1C in the last 72 hours. CBG: No results for input(s): GLUCAP in the last 168 hours. Lipid Profile: No results for input(s): CHOL, HDL, LDLCALC, TRIG, CHOLHDL, LDLDIRECT in the last 72 hours. Thyroid Function Tests: Recent Labs    04/03/19 0216  TSH 1.885   Anemia Panel: No results for input(s): VITAMINB12, FOLATE, FERRITIN, TIBC, IRON, RETICCTPCT in the last 72 hours. Sepsis Labs: Recent Labs  Lab 04/01/19 3094 04/01/19 1151 03/28/2019 1626 03/11/2019 1640  PROCALCITON  --   --   --  <0.10  LATICACIDVEN 1.1 0.7 1.3 1.1    Recent Results (from the past 240 hour(s))  SARS CORONAVIRUS 2 (TAT 6-24 HRS) Nasopharyngeal Nasopharyngeal Swab      Status: None   Collection Time: 04/01/19  9:38 AM   Specimen: Nasopharyngeal Swab  Result Value Ref Range Status   SARS Coronavirus 2 NEGATIVE NEGATIVE Final    Comment: (NOTE) SARS-CoV-2 target nucleic acids are NOT DETECTED. The SARS-CoV-2 RNA is generally detectable in upper and lower respiratory specimens during the acute phase of infection. Negative results do not preclude SARS-CoV-2 infection, do not rule out co-infections with other pathogens, and should not be used as the sole basis for treatment or other patient management decisions. Negative results must be combined with clinical observations, patient history, and epidemiological information. The expected result  is Negative. Fact Sheet for Patients: SugarRoll.be Fact Sheet for Healthcare Providers: https://www.woods-mathews.com/ This test is not yet approved or cleared by the [place] FDA and  has been authorized for detection and/or diagnosis of SARS-CoV-2 by FDA under an Emergency Use Authorization (EUA). This EUA will remain  in effect (meaning this test can be used) for the duration of the COVID-19 declaration under Section 56 4(b)(1) of the Act, 21 U.S.C. section 360bbb-3(b)(1), unless the authorization is terminated or revoked sooner. Performed at [facility], [place] [address] [place], [place] [address]   Culture, blood (routine x 2)     Status: None (Preliminary result)   Collection Time: 04/01/19  9:43 AM   Specimen: BLOOD RIGHT WRIST  Result Value Ref Range Status   Specimen Description BLOOD RIGHT WRIST BOTTLES DRAWN AEROBIC ONLY  Final   Special Requests Blood Culture adequate volume  Final   Culture   Final    NO GROWTH 3 DAYS Performed at [facility], [address] [place], [place] [address]    Report Status PENDING  Incomplete  Culture, blood (routine x 2)     Status: None (Preliminary result)   Collection Time: 04/01/19  9:52 AM   Specimen: BLOOD  RIGHT ARM  Result Value Ref Range Status   Specimen Description   Final    BLOOD RIGHT ARM BOTTLES DRAWN AEROBIC AND ANAEROBIC   Special Requests Blood Culture adequate volume  Final   Culture   Final    NO GROWTH 3 DAYS Performed at [facility], [address] [place], [place] [address]    Report Status PENDING  Incomplete  Culture, blood (Routine x 2)     Status: None (Preliminary result)   Collection Time: 03/12/2019  4:40 PM   Specimen: BLOOD RIGHT FOREARM  Result Value Ref Range Status   Specimen Description   Final    BLOOD RIGHT FOREARM Performed at [facility], [place] [address] [place], [place] [address]    Special Requests   Final    BOTTLES DRAWN AEROBIC AND ANAEROBIC Blood Culture adequate volume Performed at [place] [address] [place], [place] [address]    Culture   Final    NO GROWTH 2 DAYS Performed at [place] [address] [place], [place] [address]    Report Status PENDING  Incomplete  Culture, blood (Routine x 2)     Status: None (Preliminary result)   Collection Time: 03/05/2019  4:40 PM   Specimen: BLOOD LEFT FOREARM  Result Value Ref Range Status   Specimen Description   Final    BLOOD LEFT FOREARM Performed at [place] [address] [place], [place] [address]    Special Requests   Final    BOTTLES DRAWN AEROBIC AND ANAEROBIC Blood Culture adequate volume Performed at [place] [address] [place], [place] [address]    Culture  Setup Time   Final    ANAEROBIC BOTTLE ONLY GRAM POSITIVE COCCI CRITICAL RESULT CALLED TO, READ BACK BY AND VERIFIED WITH: Lavell Luster [facility] 04/22/2019 0021 JDW    Culture   Final    CULTURE REINCUBATED FOR BETTER GROWTH Performed at [facility], [place] [address] [place],  [address]    Report Status PENDING  Incomplete  Respiratory Panel by PCR     Status: None   Collection Time: 03/09/2019  8:35 PM    Specimen: Nasopharyngeal Swab; Respiratory  Result Value Ref Range Status   Adenovirus NOT DETECTED NOT DETECTED Final   Coronavirus 229E NOT DETECTED NOT DETECTED Final    Comment: (NOTE) The Coronavirus on the Respiratory Panel, DOES NOT test for the novel  Coronavirus (2019 nCoV)    Coronavirus HKU1 NOT DETECTED NOT DETECTED Final   Coronavirus NL63 NOT DETECTED NOT DETECTED Final   Coronavirus OC43 NOT DETECTED NOT DETECTED Final   Metapneumovirus NOT DETECTED NOT DETECTED Final   Rhinovirus / Enterovirus NOT DETECTED NOT DETECTED Final   Influenza A NOT DETECTED NOT DETECTED Final   Influenza B NOT DETECTED NOT DETECTED Final   Parainfluenza Virus 1 NOT DETECTED NOT DETECTED Final   Parainfluenza Virus 2 NOT DETECTED NOT DETECTED Final   Parainfluenza Virus 3 NOT DETECTED NOT DETECTED Final   Parainfluenza Virus 4 NOT DETECTED NOT DETECTED Final   Respiratory Syncytial Virus NOT DETECTED NOT DETECTED Final   Bordetella pertussis NOT DETECTED NOT DETECTED Final   Chlamydophila pneumoniae NOT DETECTED NOT DETECTED Final   Mycoplasma pneumoniae NOT DETECTED NOT DETECTED Final    Comment: Performed at [facility], [place] [address] [place], [place] [address]  MRSA PCR Screening     Status: None   Collection Time: 04/03/19 12:22 AM   Specimen: Nasal Mucosa; Nasopharyngeal  Result Value Ref Range Status   MRSA by PCR NEGATIVE NEGATIVE Final    Comment:        The GeneXpert MRSA Assay (FDA approved for NASAL specimens only), is one component of a comprehensive MRSA colonization surveillance program. It is not intended to diagnose MRSA infection nor to guide or monitor treatment for MRSA infections. Performed at [facility], [place] [address], [place], [place] [address]       Radiology Studies: Dg Chest 1 View  Result Date: 04/29/2019 CLINICAL DATA:  Fluid excess EXAM: CHEST  1 VIEW COMPARISON:  04/01/2019 chest radiograph. FINDINGS: Left subclavian  Port-A-Cath terminates in the upper third of the SVC. Stable cardiomediastinal silhouette with normal heart size. No pneumothorax. No left pleural effusion. Near complete opacification of the right hemithorax, new. Volume loss in the right hemithorax, new. Hazy reticular opacities in the lower left lung, worsened. IMPRESSION: 1. New near complete right hemithorax opacification with volume loss. This rapid interval change suggests mucous impaction in the central right airways. Chest radiograph follow-up and/or bronchoscopic evaluation suggested. 2. Worsened hazy reticular opacities at the left lung base, favor atelectasis, cannot exclude a component of aspiration. Electronically Signed   By: Ilona Sorrel M.D.   On: 04/25/2019 11:34   Dg Chest 2 View  Result Date: 04/01/2019 CLINICAL DATA:  Shortness of breath and chest pain EXAM: CHEST - 2 VIEW COMPARISON:  04/01/2019, 01/22/2019 FINDINGS: Left-sided central venous port with tip over the SVC. Emphysematous disease. Stable to slight decreased subpleural right upper lobe lung mass. Chronic interstitial changes. No acute consolidation or effusion. Normal heart size. Aortic atherosclerosis. No pneumothorax. IMPRESSION: No active cardiopulmonary disease. Emphysematous disease and chronic interstitial opacities. No acute airspace disease. Stable to slight decreased subpleural right upper lobe lung mass Electronically Signed   By: Donavan Foil M.D.   On: 03/16/2019 18:09        Scheduled Meds:  Chlorhexidine Gluconate Cloth  6 each Topical Daily   mouth rinse  15 mL Mouth Rinse BID   Continuous Infusions:   Assessment & Plan:   1. Neutropenic fever with unclear source of sepsis.  On broad-spectrum antibiotics and seen by  oncology. On flucanozole for dysphagia/oral candidiasis/mucositis. 2.  Acute hypoxic respiratory failure: Patient's oxygen requirement went up to 6 L this afternoon.  Stat chest x-ray showed complete whiteout on the right side  indicating lung collapse possibly due to mucous plug/malignancy.  I briefly discussed with PCCM Dr. Lamonte Sakai who felt patient would be a poor candidate for any aggressive interventions like bronchoscopy-at the most pulmonary toilet recommended.  Continue neb treatments. 3.  Hypotension: In the setting of problem #1.  Patient also reports running low blood pressure at baseline. Improved with PRBC transfusion, 1 L of fluid bolus and 1 dose of midodrine yesterday but low again this morning.  Been on maintenance fluids overnight. 4.  Rapid atrial fibrillation with RVR: Fortunately her blood pressure was transiently elevated this afternoon and she was able to tolerate metoprolol 2.5 mg IV x1 with improvement in heart rate to low 100s. 5. Pancytopenia: in the setting of chemotherapy/radiation/sepsis.  She received blood transfusion on 12/1.  Discussed with cardiology and discontinued aspirin yesterday.  Unfortunately repeat CBC today shows worsening cell counts with neutrophil count less than 0.1, platelets less than 5 and hemoglobin 6.7.  Her risk of spontaneous bleeding is extremely high at this time. 6. CAD/ unstable angina: Patient follows Dr. Earnie Larsson as outpatient with recent abnormal stress test-reversible perfusion defects but intact EF.  Patient was not felt to be candidate for cardiac cath.  Aspirin discontinued yesterday for thrombocytopenia.  Held nitrates/beta-blockers due to hypotension.  Received 1 dose of IV metoprolol today for #4.  Nitro sublingual as needed available.  Not a candidate for further interventions/heparin 7.Small cell Lung ca : T3, N3, M0- with right upper lobe lung mass in addition to satellite nodule as well as right hilar, mediastinal and right supraclavicular lymphadenopathy diagnosed in September 2020.  On Concurrent chemoradiation with carboplatin, etoposide with neulasta support. First cycle on 02/12/2019. Last session on 11/27.  Appreciate oncology evaluation. 8. COPD/Emphysema  : Reports recent smoking cessation. Nebs prn 9. Hypokalemia: She has received p.o./IV replacement as needed during the hospital course Code Status: DNR Family / Patient Communication: d/w patient and son-Travis at bedside again today regarding grave prognosis/terminal condition with high risk of spontaneous bleeding worsening neutropenia/sepsis and worsening hemoglobin in spite of blood transfusion.  Additionally patient now also has a complete white out on right side of her lung and felt to be a poor candidate by PCCM for any invasive procedures.  A. fib with RVR transiently improved with IV metoprolol but likely exacerbated by underlying lung conditions and most possibly will recur.  Patient also having chest pain episodes but unable to tolerate nitrates. Patient requesting additional morphine for pain relief and wants to transition to comfort care goals understanding grave prognosis.  Son who is at bedside is tearful but agrees with his mother's wishes.  I also discussed with patient's daughter who will is an Therapist, sports and reiterated her mother's wishes as expressed to them previously to focus on comfort goals.  Palliative care consulted this morning and I have updated Dr. Domingo Cocking regarding events this afternoon--he plans to evaluate patient soon.  Meanwhile I have discontinued IV fluids/IV antibiotics/midodrine and other medications.  Continue morphine as needed, Ativan as needed, O2, neb treatments.  No further labs.  Updated oncology team as well. Disposition Plan: TBD.  Likely will not survive this hospitalization     LOS: 1 day    Total time spent: 55 minutes, critical care time 38 minutes   Guilford Shi, MD [facility]  Pager [phone]  If 7PM-7AM, please contact night-coverage [email or web address] Password [facility] 05/01/2019, 3:19 PM

## 2019-04-04 NOTE — Consult Note (Signed)
[facility]ious [facility]  Total days of antibiotics 3         Reason for Consult:febrile neutropenia    Referring Physician: kamineni  Active Problems:   Hypertension   Mixed hyperlipidemia   Tobacco abuse   COPD (chronic obstructive pulmonary disease) (HCC)   Malignant neoplasm of upper lobe of right lung (HCC)   Neutropenic fever ([place])   Sepsis ([place])   Thrush    HPI: Stacy [name] is a 73 y.o. female with hx of COPD, with limited SCLCa (T3N3M0) who was admitted in Sep202 with RUL mass, and surrounding right sided LN. She is undergoing concurrent chemoradiation with carboplatin, etoposide X 3 wk, (1st cycle 10/12, 2nd cycle 11/2, 3rd cycle 48/01, complicated by neutropenia. She received neulasta on 11/27. She then presented on 11/29 with weakness and dehydration, nausea with pain on swallowing. In the ED, WBC 0.1 - received fluids but then found to have fever to 103F while in the ED - thus admitted for febrile neutropenia. She was started on vancomycin, aztreonam and fluconazole. Her port does not suggest any erythema. ? Mucositis. Initially ID consult for abtx management. This afternoon patient transitioned to comfort care.  Past Medical History:  Diagnosis Date  . Carotid bruit    Bilateral  . Chest pain    occasionally, takes nitro if needed, at rest (1-2 times a month)  . Colon polyps    with divertirticulitis  . COPD (chronic obstructive pulmonary disease) ([place])   . DJD (degenerative joint disease)   . Emphysema lung ([place])   . Family history of adverse reaction to anesthesia    mom PONV  . Hyperlipidemia   . Hypertension   . Lung cancer ([place])   . Lymph node cancer ([place])   . Osteopenia   . Prediabetes   . Vitamin D deficiency     Allergies:  Allergies  Allergen Reactions  . Dilaudid [Hydromorphone Hcl] Shortness Of Breath and Swelling  . Fentanyl Shortness Of Breath and Swelling  . Penicillins Anaphylaxis    (Had skin test at [facility]) Did it  involve swelling of the face/tongue/throat, SOB, or low BP? Yes  +++CARDIAC ARREST+++ Did it involve sudden or severe rash/hives, skin peeling, or any reaction on the inside of your mouth or nose? No Did you need to seek medical attention at a hospital or doctor's office? Yes When did it last happen? Many years ago If all above answers are "NO", may proceed with cephalosporin use.   . Crestor [Rosuvastatin] Other (See Comments)    Joint pain, cramping     MEDICATIONS: . Chlorhexidine Gluconate Cloth  6 each Topical Daily  . mouth rinse  15 mL Mouth Rinse BID  . potassium chloride  40 mEq Oral Once    Social History   Tobacco Use  . Smoking status: Current Every Day Smoker    Packs/day: 1.00  . Smokeless tobacco: Never Used  . Tobacco comment: smokes 2 cigarettes per day  Substance Use Topics  . Alcohol use: No  . Drug use: Never    Family History  Problem Relation Age of Onset  . Hyperlipidemia Mother   . Heart disease Mother        7 way bypass/ value replacement/pacemaker  . Heart attack Mother 60  . Diabetes Mother   . Cancer - Other Mother        lung  . Hypertension Father   . Heart attack Father 14  . Cancer -  Other Father        throat     OBJECTIVE: Temp:  [98.2 F (36.8 C)-99.1 F (37.3 C)] 98.9 F (37.2 C) (12/02 1600) Pulse Rate:  [109-156] 156 (12/02 1500) Resp:  [15-28] 20 (12/02 1500) BP: (78-147)/(48-92) 78/60 (12/02 1500) SpO2:  [88 %-100 %] 100 % (12/02 1500) Did not examine  LABS: Results for orders placed or performed during the hospital encounter of 03/18/2019 (from the past 48 hour(s))  POC SARS Coronavirus 2 Ag-ED - Nasal Swab (BD Veritor Kit)     Status: None   Collection Time: 03/07/2019  5:48 PM  Result Value Ref Range   SARS Coronavirus 2 Ag NEGATIVE NEGATIVE    Comment: (NOTE) SARS-CoV-2 antigen NOT DETECTED.  Negative results are presumptive.  Negative results do not preclude SARS-CoV-2 infection and should not be used as  the sole basis for treatment or other patient management decisions, including infection  control decisions, particularly in the presence of clinical signs and  symptoms consistent with COVID-19, or in those who have been in contact with the virus.  Negative results must be combined with clinical observations, patient history, and epidemiological information. The expected result is Negative. Fact Sheet for Patients: PodPark.tn Fact Sheet for Healthcare Providers: GiftContent.is This test is not yet approved or cleared by the [place] FDA and  has been authorized for detection and/or diagnosis of SARS-CoV-2 by FDA under an Emergency Use Authorization (EUA).  This EUA will remain in effect (meaning this test can be used) for the duration of  the COVID-19 de claration under Section 564(b)(1) of the Act, 21 U.S.C. section 360bbb-3(b)(1), unless the authorization is terminated or revoked sooner.   Respiratory Panel by PCR     Status: None   Collection Time: 03/27/2019  8:35 PM   Specimen: Nasopharyngeal Swab; Respiratory  Result Value Ref Range   Adenovirus NOT DETECTED NOT DETECTED   Coronavirus 229E NOT DETECTED NOT DETECTED    Comment: (NOTE) The Coronavirus on the Respiratory Panel, DOES NOT test for the novel  Coronavirus (2019 nCoV)    Coronavirus HKU1 NOT DETECTED NOT DETECTED   Coronavirus NL63 NOT DETECTED NOT DETECTED   Coronavirus OC43 NOT DETECTED NOT DETECTED   Metapneumovirus NOT DETECTED NOT DETECTED   Rhinovirus / Enterovirus NOT DETECTED NOT DETECTED   Influenza A NOT DETECTED NOT DETECTED   Influenza B NOT DETECTED NOT DETECTED   Parainfluenza Virus 1 NOT DETECTED NOT DETECTED   Parainfluenza Virus 2 NOT DETECTED NOT DETECTED   Parainfluenza Virus 3 NOT DETECTED NOT DETECTED   Parainfluenza Virus 4 NOT DETECTED NOT DETECTED   Respiratory Syncytial Virus NOT DETECTED NOT DETECTED   Bordetella pertussis  NOT DETECTED NOT DETECTED   Chlamydophila pneumoniae NOT DETECTED NOT DETECTED   Mycoplasma pneumoniae NOT DETECTED NOT DETECTED    Comment: Performed at [facility], [place] [address], [place], [place] [address]  Type and screen [place]     Status: None   Collection Time: 03/21/2019  8:35 PM  Result Value Ref Range   ABO/RH(D) O NEG    Antibody Screen NEG    Sample Expiration 04/05/2019,2359    Unit Number [id number]    Blood Component Type RED CELLS,LR    Unit division 00    Status of Unit ISSUED,FINAL    Transfusion Status OK TO TRANSFUSE    Crossmatch Result      Compatible Performed at [facility], [place] [address] [place],  [place] [address]   ABO/Rh     Status: None   Collection Time: 03/14/2019  8:35 PM  Result Value Ref Range   ABO/RH(D)      Jenetta Downer NEG Performed at [facility], [place] [address], [place], [place] [address]   MRSA PCR Screening     Status: None   Collection Time: 04/03/19 12:22 AM   Specimen: Nasal Mucosa; Nasopharyngeal  Result Value Ref Range   MRSA by PCR NEGATIVE NEGATIVE    Comment:        The GeneXpert MRSA Assay (FDA approved for NASAL specimens only), is one component of a comprehensive MRSA colonization surveillance program. It is not intended to diagnose MRSA infection nor to guide or monitor treatment for MRSA infections. Performed at [facility], [place] [address] [place], [place] [address]   Magnesium     Status: Abnormal   Collection Time: 04/03/19  2:16 AM  Result Value Ref Range   Magnesium 1.3 (L) 1.7 - 2.4 mg/dL    Comment: Performed at [facility], [place] [address] [place], [place] [address]  Phosphorus     Status: Abnormal   Collection Time: 04/03/19  2:16 AM  Result Value Ref Range   Phosphorus 2.0 (L) 2.5 - 4.6 mg/dL    Comment: Performed at [facility], [place] [address] [place], [place] [address]  TSH      Status: None   Collection Time: 04/03/19  2:16 AM  Result Value Ref Range   TSH 1.885 0.350 - 4.500 uIU/mL    Comment: Performed by a 3rd Generation assay with a functional sensitivity of <=0.01 uIU/mL. Performed at [facility] [place] [address], [place], [place] [address]   Comprehensive metabolic panel     Status: Abnormal   Collection Time: 04/03/19  2:16 AM  Result Value Ref Range   Sodium 135 135 - 145 mmol/L   Potassium 3.2 (L) 3.5 - 5.1 mmol/L   Chloride 104 98 - 111 mmol/L   CO2 22 22 - 32 mmol/L   Glucose, Bld 109 (H) 70 - 99 mg/dL   BUN 8 8 - 23 mg/dL   Creatinine, Ser 0.45 0.44 - 1.00 mg/dL   Calcium 7.7 (L) 8.9 - 10.3 mg/dL   Total Protein 5.8 (L) 6.5 - 8.1 g/dL   Albumin 2.7 (L) 3.5 - 5.0 g/dL   AST 12 (L) 15 - 41 U/L   ALT 12 0 - 44 U/L   Alkaline Phosphatase 53 38 - 126 U/L   Total Bilirubin 0.5 0.3 - 1.2 mg/dL   GFR calc non Af Amer >60 >60 mL/min   GFR calc Af Amer >60 >60 mL/min   Anion gap 9 5 - 15    Comment: Performed at [facility], [place] [address], [place], [place] [address]  CBC     Status: Abnormal   Collection Time: 04/03/19  2:16 AM  Result Value Ref Range   WBC 0.1 (LL) 4.0 - 10.5 K/uL    Comment: REPEATED TO VERIFY CRITICAL VALUE NOTED.  VALUE IS CONSISTENT WITH PREVIOUSLY REPORTED AND CALLED VALUE. THIS CRITICAL RESULT HAS VERIFIED AND BEEN CALLED TO [facility],S BY POTEAT,SHANNON ON 12 01 2020 AT 0310, AND HAS BEEN READ BACK. CRITICAL RESULT VERIFIED    RBC 2.22 (L) 3.87 - 5.11 MIL/uL   Hemoglobin 7.3 (L) 12.0 - 15.0 g/dL   HCT 21.9 (L) 36.0 - 46.0 %   MCV 98.6 80.0 -  100.0 fL   MCH 32.9 26.0 - 34.0 pg   MCHC 33.3 30.0 - 36.0 g/dL   RDW 13.2 11.5 - 15.5 %   Platelets 23 (LL) 150 - 400 K/uL    Comment: REPEATED TO VERIFY PLATELET COUNT CONFIRMED BY SMEAR SPECIMEN CHECKED FOR CLOTS Immature Platelet Fraction may be clinically indicated, consider ordering this additional test [id number]    nRBC 0.0 0.0 -  0.2 %    Comment: Performed at [facility], [place] [address], [place], [place] [address]  Prealbumin     Status: Abnormal   Collection Time: 04/03/19  2:16 AM  Result Value Ref Range   Prealbumin 8.9 (L) 18 - 38 mg/dL    Comment: Performed at [facility], [place] [address] [place], [place] [address]  Troponin I (High Sensitivity)     Status: Abnormal   Collection Time: 04/03/19  7:30 AM  Result Value Ref Range   Troponin I (High Sensitivity) 98 (H) <18 ng/L    Comment: (NOTE) Elevated high sensitivity troponin I (hsTnI) values and significant  changes across serial measurements may suggest ACS but many other  chronic and acute conditions are known to elevate hsTnI results.  Refer to the "Links" section for chest pain algorithms and additional  guidance. Performed at [facility], [place] [address], [place], [place] [address]   Prepare RBC     Status: None   Collection Time: 04/03/19 10:13 AM  Result Value Ref Range   Order Confirmation      ORDER PROCESSED BY BLOOD BANK Performed at [place] [address] [place], [place] [address]   Hemoglobin and hematocrit, blood     Status: Abnormal   Collection Time: 04/03/19  4:29 PM  Result Value Ref Range   Hemoglobin 7.0 (L) 12.0 - 15.0 g/dL   HCT 20.8 (L) 36.0 - 46.0 %    Comment: Performed at [facility], [place] [address], [place], [place] [address]  Potassium     Status: Abnormal   Collection Time: 04/03/19  4:29 PM  Result Value Ref Range   Potassium 3.1 (L) 3.5 - 5.1 mmol/L    Comment: Performed at [facility], [place] [address] [place],  [address]  Potassium     Status: Abnormal   Collection Time: 04/03/19  6:05 PM  Result Value Ref Range   Potassium 3.2 (L) 3.5 - 5.1 mmol/L    Comment: Performed at [facility], [place] [address], [place],  [address]  Hemoglobin and hematocrit, blood      Status: Abnormal   Collection Time: 04/03/19  6:05 PM  Result Value Ref Range   Hemoglobin 7.4 (L) 12.0 - 15.0 g/dL   HCT 21.5 (L) 36.0 - 46.0 %    Comment: Performed at [facility], [place] [address], [place],  [address]  CBC with Differential     Status: Abnormal   Collection Time: 04/14/2019 12:24 PM  Result Value Ref Range   WBC 0.1 (LL) 4.0 - 10.5 K/uL    Comment: This critical result has verified and been called to [facility]. RN by Sarita Bottom on 12 02 2020 at 1313, and has been read back. CRITICAL RESULTS VERIFIED   RBC 2.10 (L) 3.87 - 5.11 MIL/uL   Hemoglobin 6.7 (LL) 12.0 - 15.0 g/dL    Comment: This critical result has verified and been called to [facility]. RN by Sarita Bottom on 12 02  2020 at 1313, and has been read back. CRITICAL RESULTS VERIFIED   HCT 20.0 (L) 36.0 - 46.0 %   MCV 95.2 80.0 - 100.0 fL   MCH 31.9 26.0 - 34.0 pg   MCHC 33.5 30.0 - 36.0 g/dL   RDW 14.0 11.5 - 15.5 %   Platelets 4 (LL) 150 - 400 K/uL    Comment: Immature Platelet Fraction may be clinically indicated, consider ordering this additional test [id number] THIS CRITICAL RESULT HAS VERIFIED AND BEEN CALLED TO BLACKBURN,B. RN BY KATHLEEN COHEN ON 12 02 2020 AT 2778, AND HAS BEEN READ BACK. CRITICAL RESULTS VERIFIED    nRBC 0.0 0.0 - 0.2 %   Smear Review TOO FEW TO COUNT, SMEAR AVAILABLE FOR REVIEW     Comment: Performed at [facility], [place] [address] [place], [place] [address]  Comprehensive metabolic panel     Status: Abnormal   Collection Time: 04/17/2019 12:24 PM  Result Value Ref Range   Sodium 136 135 - 145 mmol/L   Potassium 3.0 (L) 3.5 - 5.1 mmol/L   Chloride 103 98 - 111 mmol/L   CO2 23 22 - 32 mmol/L   Glucose, Bld 96 70 - 99 mg/dL   BUN 7 (L) 8 - 23 mg/dL   Creatinine, Ser 0.44 0.44 - 1.00 mg/dL   Calcium 7.8 (L) 8.9 - 10.3 mg/dL   Total Protein 5.2 (L) 6.5 - 8.1 g/dL   Albumin 2.0 (L) 3.5 - 5.0 g/dL   AST 21 15 - 41 U/L   ALT 12 0  - 44 U/L   Alkaline Phosphatase 42 38 - 126 U/L   Total Bilirubin 0.9 0.3 - 1.2 mg/dL   GFR calc non Af Amer >60 >60 mL/min   GFR calc Af Amer >60 >60 mL/min   Anion gap 10 5 - 15    Comment: Performed at [facility], [place] [address] [place], [place] [address]  Blood gas, arterial     Status: Abnormal   Collection Time: 04/03/2019  1:11 PM  Result Value Ref Range   O2 Content 6.0 L/min   pH, Arterial 7.402 7.350 - 7.450   pCO2 arterial 34.9 32.0 - 48.0 mmHg   pO2, Arterial 92.7 83.0 - 108.0 mmHg   Bicarbonate 21.3 20.0 - 28.0 mmol/L   Acid-base deficit 2.4 (H) 0.0 - 2.0 mmol/L   O2 Saturation 96.9 %   Patient temperature 98.6     Comment: Performed at [facility], [place] [address], [place],  [address]    MICRO:  IMAGING: Dg Chest 1 View  Result Date: 04/05/2019 CLINICAL DATA:  Fluid excess EXAM: CHEST  1 VIEW COMPARISON:  03/16/2019 chest radiograph. FINDINGS: Left subclavian Port-A-Cath terminates in the upper third of the SVC. Stable cardiomediastinal silhouette with normal heart size. No pneumothorax. No left pleural effusion. Near complete opacification of the right hemithorax, new. Volume loss in the right hemithorax, new. Hazy reticular opacities in the lower left lung, worsened. IMPRESSION: 1. New near complete right hemithorax opacification with volume loss. This rapid interval change suggests mucous impaction in the central right airways. Chest radiograph follow-up and/or bronchoscopic evaluation suggested. 2. Worsened hazy reticular opacities at the left lung base, favor atelectasis, cannot exclude a component of aspiration. Electronically Signed   By: Ilona Sorrel M.D.   On: 04/07/2019 11:34   Dg Chest 2 View  Result Date: 03/25/2019 CLINICAL DATA:  Shortness of breath and chest pain EXAM: CHEST - 2 VIEW  COMPARISON:  04/01/2019, 01/22/2019 FINDINGS: Left-sided central venous port with tip over the SVC. Emphysematous disease. Stable  to slight decreased subpleural right upper lobe lung mass. Chronic interstitial changes. No acute consolidation or effusion. Normal heart size. Aortic atherosclerosis. No pneumothorax. IMPRESSION: No active cardiopulmonary disease. Emphysematous disease and chronic interstitial opacities. No acute airspace disease. Stable to slight decreased subpleural right upper lobe lung mass Electronically Signed   By: Donavan Foil M.D.   On: 03/26/2019 18:09    Assessment/Plan:  73yo F with SCLCa s/p 3rd cycle of carbo-etoposide admitted for FN - consider changing fluconazole to anidulafungin if patient still pursues to have abtx therapy - can continue on vancomycin/aztreonam should she reverse goals of care  Will sign off, call back for questions.

## 2019-04-04 NOTE — Progress Notes (Signed)
[name] from Radiation oncology  called to see if Stacy [name] was going to have treatment today. Called Ms. Chestang Nurse [place] @ 900 am  and she stated that she would not be having treatment today . Notified [name] on the treatment machine.

## 2019-04-04 NOTE — Consult Note (Signed)
Consultation Note Date: 04/19/2019   Patient Name: Stacy [name]  DOB: 16-Jun-1945  MRN: [id number]  Age / Sex: 73 y.o., female  PCP: [name], MD Referring Physician: Guilford Shi, MD  Reason for Consultation: Establishing goals of care  HPI/Patient Profile: 73 y.o. female  with past medical history of COPD, small cell lung cancer with right-sided lymphadenopathy currently undergoing chemoradiation with complication of neutropenia admitted on 03/30/2019 with febrile neutropenia and sepsis.  Family has discussed transition to full comfort care with Dr. Earnest Conroy today.  Palliative consulted for [place] and EOL assistance.     Clinical Assessment and Goals of Care: I met today with Ms. Pfefferle. We discussed clinical course as well as wishes moving forward in regard to her care moving forward.  We discussed difference between a aggressive medical intervention path and a palliative, comfort focused care path.  Values and goals of care important to patient and family were attempted to be elicited.  She is clear in desire for comfort as primary focus.  I then met with her son and spoke with her daughter via phone to review care plan.  Questions and concerns addressed.   PMT will continue to support holistically.  SUMMARY OF RECOMMENDATIONS   - Full comfort - Orders updated using EOL order set.  Pain/SOB: Morphine as needed.  Low threshold to initiate continuous infusion if needed for comfort.  Anxiety: Ativan as needed  Excess secretions: Robinul as needed  Agitation: Haldol as needed  Requesting foley for comfort. - Met with family, including son in person and daughter via phone.  Reviewed plan for comfort and they confirmed that this plan is consistent with what their mother has stated for years, particularly since then she was diagnosed with lung cancer. -Plan for visit or exceptions per  end-of-life visitation policy. -Anticipate this will be a terminal admission.  Code Status/Advance Care Planning:  DNR   Symptom Management:   As above.  Palliative Prophylaxis:   Aspiration, Bowel Regimen, Delirium Protocol and Frequent Pain Assessment  Additional Recommendations (Limitations, Scope, Preferences):  Full Comfort Care   Additional Recommendations: Education on Hospice and Grief/Bereavement Support  Prognosis:   Hours - Days  Discharge Planning: Anticipated Hospital Death      Primary Diagnoses: Present on Admission: . Neutropenic fever ([place]) . Hypertension . Mixed hyperlipidemia . Tobacco abuse . COPD (chronic obstructive pulmonary disease) ([place]) . Malignant neoplasm of upper lobe of right lung ([place]) . Sepsis ([place])   I have reviewed the medical record, interviewed the patient and family, and examined the patient. The following aspects are pertinent.  Past Medical History:  Diagnosis Date  . Carotid bruit    Bilateral  . Chest pain    occasionally, takes nitro if needed, at rest (1-2 times a month)  . Colon polyps    with divertirticulitis  . COPD (chronic obstructive pulmonary disease) ([place])   . DJD (degenerative joint disease)   . Emphysema lung ([place])   . Family history of adverse reaction to anesthesia  mom PONV  . Hyperlipidemia   . Hypertension   . Lung cancer ([place])   . Lymph node cancer ([place])   . Osteopenia   . Prediabetes   . Vitamin D deficiency    Social History   Socioeconomic History  . Marital status: Divorced    Spouse name: Not on file  . Number of children: 3  . Years of education: Not on file  . Highest education level: Not on file  Occupational History  . Not on file  Social Needs  . Financial resource strain: Not on file  . Food insecurity    Worry: Not on file    Inability: Not on file  . Transportation needs    Medical: Not on file    Non-medical: Not on file  Tobacco Use  . Smoking status:  Current Every Day Smoker    Packs/day: 1.00  . Smokeless tobacco: Never Used  . Tobacco comment: smokes 2 cigarettes per day  Substance and Sexual Activity  . Alcohol use: No  . Drug use: Never  . Sexual activity: Not on file  Lifestyle  . Physical activity    Days per week: Not on file    Minutes per session: Not on file  . Stress: Not on file  Relationships  . Social Herbalist on phone: Not on file    Gets together: Not on file    Attends religious service: Not on file    Active member of club or organization: Not on file    Attends meetings of clubs or organizations: Not on file    Relationship status: Not on file  Other Topics Concern  . Not on file  Social History Narrative   Divorced- 3children /2 girls and 1/boy--RN ( works as an Production assistant, radio at [employer])   Family History  Problem Relation Age of Onset  . Hyperlipidemia Mother   . Heart disease Mother        7 way bypass/ value replacement/pacemaker  . Heart attack Mother 69  . Diabetes Mother   . Cancer - Other Mother        lung  . Hypertension Father   . Heart attack Father 7  . Cancer - Other Father        throat   Scheduled Meds: . potassium chloride  40 mEq Oral Once   Continuous Infusions: PRN Meds:.acetaminophen **OR** acetaminophen, antiseptic oral rinse, glycopyrrolate **OR** glycopyrrolate **OR** glycopyrrolate, haloperidol **OR** haloperidol **OR** haloperidol lactate, ipratropium-albuterol, LORazepam, magic mouthwash w/lidocaine, morphine injection, nitroGLYCERIN, ondansetron **OR** ondansetron (ZOFRAN) IV, polyvinyl alcohol Medications Prior to Admission:  Prior to Admission medications   Medication Sig Start Date End Date Taking? Authorizing Provider  albuterol (PROAIR HFA) 108 (90 Base) MCG/ACT inhaler Inhale 2 puffs into the lungs every 6 (six) hours as needed for wheezing or shortness of breath. 06/23/17  Yes Vicie Mutters, PA-C  ALPRAZolam (XANAX) 0.5 MG tablet TAKE 1/2 TO 1  (ONE-HALF TO ONE) TABLET BY MOUTH TWICE DAILY AS NEEDED FOR ANXIETY OR&nbsp;&nbsp;SLEEP Patient taking differently: Take 0.5-1 mg by mouth 2 (two) times daily as needed for anxiety or sleep.  01/22/19  Yes Vicie Mutters, PA-C  aspirin 81 MG tablet Take 162 mg by mouth daily.    Yes [provider]  atenolol (TENORMIN) 25 MG tablet Take 1 tablet Daily for BP Patient taking differently: Take 25 mg by mouth daily.  08/25/18  Yes [name], MD  cholecalciferol (VITAMIN D) 1000 units tablet Take  1,000 Units by mouth daily.    Yes [provider]  diphenhydramine-acetaminophen (TYLENOL PM) 25-500 MG TABS tablet Take 1 tablet by mouth at bedtime as needed (sleep).    Yes [provider]  ezetimibe (ZETIA) 10 MG tablet TAKE 1 TABLET BY MOUTH ONCE DAILY FOR CHOLESTEROL Patient taking differently: Take 10 mg by mouth daily.  04/07/18  Yes [name], MD  fluconazole (DIFLUCAN) 200 MG tablet Take 1 tablet (200 mg total) by mouth daily for 7 days. 04/01/19 04/08/19 Yes Isla Pence, MD  ibuprofen (ADVIL) 200 MG tablet Take 600 mg by mouth every 6 (six) hours as needed for headache or mild pain.   Yes [provider]  isosorbide mononitrate (IMDUR) 30 MG 24 hr tablet Take 30 mg by mouth daily.   Yes [provider]  lidocaine (XYLOCAINE) 2 % solution Use as directed 15 mLs in the mouth or throat every 6 (six) hours as needed for mouth pain. 04/01/19  Yes Isla Pence, MD  lidocaine-prilocaine (EMLA) cream Apply 1 application topically as needed. 02/19/19  Yes Heilingoetter, Cassandra L, PA-C  nitroGLYCERIN (NITROSTAT) 0.4 MG SL tablet Place 1 tablet (0.4 mg total) under the tongue every 5 (five) minutes as needed for chest pain. 01/23/19 04/23/19 Yes Patwardhan, Manish J, MD  ondansetron (ZOFRAN) 8 MG tablet Take 1 tablet (8 mg total) by mouth every 12 (twelve) hours as needed for nausea or vomiting. 03/23/19  Yes Kyung Rudd, MD  oxyCODONE (OXY  IR/ROXICODONE) 5 MG immediate release tablet Take 1 tablet (5 mg total) by mouth every 4 (four) hours as needed for moderate pain or breakthrough pain. 03/05/19  Yes Curt Bears, MD  prochlorperazine (COMPAZINE) 10 MG tablet TAKE ONE TABLET BY MOUTH EVERY 6 HOURS AS NEEDED FOR NAUSEA AND VOMITING Patient taking differently: Take 10 mg by mouth every 6 (six) hours as needed for nausea or vomiting.  03/19/19  Yes Curt Bears, MD  sucralfate (CARAFATE) 1 g tablet Take 1 tablet (1 g total) by mouth 4 (four) times daily. 03/09/19  Yes Kyung Rudd, MD  dexamethasone (DECADRON) 4 MG tablet Take 1 tablet (4 mg total) by mouth 2 (two) times daily with a meal. Please take 1 tablet twice a day for 3-4 days after chemotherapy. Patient not taking: Reported on 03/19/2019 03/26/19   Heilingoetter, Cassandra L, PA-C  lidocaine (XYLOCAINE) 2 % solution 5 ml swish and swallow or spit for mouth pain/pain with swallowing. May add to [place] Patient not taking: Reported on 03/28/2019 02/26/19   Harle Stanford., PA-C  magic mouthwash SOLN 5 ml swish and spit QID prn mouth pain Patient not taking: Reported on 03/19/2019 02/27/19   Harle Stanford., PA-C  nystatin (MYCOSTATIN) 100000 UNIT/ML suspension Take 5 mLs by mouth 4 (four) times daily. 03/14/19   [provider]   Allergies  Allergen Reactions  . Dilaudid [Hydromorphone Hcl] Shortness Of Breath and Swelling  . Fentanyl Shortness Of Breath and Swelling  . Penicillins Anaphylaxis    (Had skin test at [facility]) Did it involve swelling of the face/tongue/throat, SOB, or low BP? Yes  +++CARDIAC ARREST+++ Did it involve sudden or severe rash/hives, skin peeling, or any reaction on the inside of your mouth or nose? No Did you need to seek medical attention at a hospital or doctor's office? Yes When did it last happen? Many years ago If all above answers are "NO", may proceed with cephalosporin use.   . Crestor [Rosuvastatin] Other (See  Comments)  Joint pain, cramping    Review of Systems  Constitutional: Positive for activity change, appetite change, fatigue and fever.  Respiratory: Positive for shortness of breath.   Musculoskeletal: Positive for arthralgias and back pain.  Neurological: Positive for weakness.   Physical Exam  General: Alert, awake, in moderate distress. Frail and cachectic.  Heart: Irregular, tachycardic. No murmur appreciated. Lungs: Decreased air movement, esp on R Abdomen: Soft, nontender, nondistended, positive bowel sounds.  Ext: No significant edema Skin: Warm and dry Vital Signs: BP (!) 78/60   Pulse (!) 156   Temp 98.9 F (37.2 C) (Axillary)   Resp 20   Ht 5' 1.5" (1.562 m)   Wt 47.6 kg   SpO2 100%   BMI 19.52 kg/m  Pain Scale: 0-10   Pain Score: Asleep   SpO2: SpO2: 100 % O2 Device:SpO2: 100 % O2 Flow Rate: .O2 Flow Rate (L/min): 2 L/min  IO: Intake/output summary:   Intake/Output Summary (Last 24 hours) at 04/03/2019 1803 Last data filed at 04/07/2019 3672 Gross per 24 hour  Intake 1051.08 ml  Output -  Net 1051.08 ml    LBM: Last BM Date: 04/03/19 Baseline Weight: Weight: 47.6 kg Most recent weight: Weight: 47.6 kg     Palliative Assessment/Data:   Flowsheet Rows     Most Recent Value  Intake Tab  Referral Department  Hospitalist  Unit at Time of Referral  Intermediate Care Unit  Palliative Care Primary Diagnosis  Cancer  Date Notified  04/18/2019  Palliative Care Type  New Palliative care  Reason for referral  Clarify Goals of Care  Date of Admission  03/26/2019  Date first seen by Palliative Care  04/18/2019  # of days Palliative referral response time  0 Day(s)  # of days IP prior to Palliative referral  2  Clinical Assessment  Palliative Performance Scale Score  20%  Psychosocial & Spiritual Assessment  Palliative Care Outcomes  Patient/Family meeting held?  Yes  Who was at the meeting?  Patient, son and daughter (via phone)      Time In: 1430  Time Out: 1600 Time Total: 90 Greater than 50%  of this time was spent counseling and coordinating care related to the above assessment and plan.  Signed by: Micheline Rough, MD   Please contact Palliative Medicine Team phone at [phone] for questions and concerns.  For individual provider: See Shea Evans

## 2019-04-04 NOTE — Progress Notes (Signed)
ABG obtained per order. Lab called @ 1313 and notified of incoming sample for analysis.

## 2019-04-04 NOTE — Progress Notes (Signed)
SLP Cancellation Note  Patient Details Name: Stacy [name] MRN: [id number] DOB: Jan 18, 1946   Cancelled treatment:       Reason Eval/Treat Not Completed: Other (comment);Medical issues which prohibited therapy(pt now comfort care, will sign off)  Luanna Salk, [facility] SLP Acute Rehab Services Pager [phone] Office [phone]  Macario Golds 04/26/2019, 3:09 PM

## 2019-04-04 NOTE — Progress Notes (Signed)
Room 1238 [place]  05/01/2019 2100 Clinical Encounter Type Visited With Patient and family together Visit Type Initial; Spiritual support Referral From Chaplain Spiritual Encounters Spiritual Needs Prayer Stress Factors Patient Stress Factors Family relationships; Health changes ; Major life changes Chaplain visited per request of Pt. During visit prayer and emotional support was provided. Family and Pt are opened to receiving future visits.

## 2019-04-04 NOTE — Progress Notes (Signed)
PHARMACY - PHYSICIAN COMMUNICATION CRITICAL VALUE ALERT - BLOOD CULTURE IDENTIFICATION (BCID)  Stacy [name] is an 73 y.o. female who presented to [facility] on 03/10/2019 with a chief complaint of . cancer patient . Hematemesis . Diarrhea . Thrush    Assessment:  Patient with sepsis and esophageal candidiasis with 1 out of 4 bottles with GPC.  No BCID is run at this time due to lack of reagent.   (include suspected source if known)  Name of physician (or Provider) Contacted: Dr. Hartford Poli  Current antibiotics: Vancomycin, aztreonam, Fluconazole  Changes to prescribed antibiotics recommended:  No changes at this time due to lack of clear direction and ICU status.  1st shift rounding team to fully evaluate patient and make anti-infective agent changes as needed  No results found for this or any previous visit.  [name] 04/17/2019  6:36 AM

## 2019-04-05 ENCOUNTER — Ambulatory Visit: Payer: Medicare HMO | Attending: Radiation Oncology

## 2019-04-05 DIAGNOSIS — Z7189 Other specified counseling: Secondary | ICD-10-CM

## 2019-04-05 DIAGNOSIS — Z510 Encounter for antineoplastic radiation therapy: Secondary | ICD-10-CM | POA: Insufficient documentation

## 2019-04-05 DIAGNOSIS — Z515 Encounter for palliative care: Secondary | ICD-10-CM

## 2019-04-05 DIAGNOSIS — G893 Neoplasm related pain (acute) (chronic): Secondary | ICD-10-CM

## 2019-04-05 DIAGNOSIS — C3411 Malignant neoplasm of upper lobe, right bronchus or lung: Secondary | ICD-10-CM | POA: Insufficient documentation

## 2019-04-05 LAB — CULTURE, BLOOD (ROUTINE X 2): Special Requests: ADEQUATE

## 2019-04-05 MED ORDER — SODIUM CHLORIDE 0.9% FLUSH
10.0000 mL | Freq: Two times a day (BID) | INTRAVENOUS | Status: DC
  Administered 2019-04-05 (×2): 10 mL

## 2019-04-05 MED ORDER — MORPHINE SULFATE (PF) 2 MG/ML IV SOLN
2.0000 mg | INTRAVENOUS | Status: AC
  Administered 2019-04-05: 17:00:00 2 mg via INTRAVENOUS
  Filled 2019-04-05: qty 1

## 2019-04-05 MED ORDER — ORAL CARE MOUTH RINSE
15.0000 mL | Freq: Two times a day (BID) | OROMUCOSAL | Status: DC
  Administered 2019-04-05 (×2): 15 mL via OROMUCOSAL

## 2019-04-05 MED ORDER — SODIUM CHLORIDE 0.9% FLUSH: 10.0000 mL | INTRAVENOUS | Status: DC | PRN

## 2019-04-05 MED ORDER — CHLORHEXIDINE GLUCONATE CLOTH 2 % EX PADS: 6.0000 | MEDICATED_PAD | Freq: Every day | CUTANEOUS | Status: DC

## 2019-04-05 NOTE — Progress Notes (Signed)
PROGRESS NOTE    Stacy [name]  GEZ:[id number]  DOB: [birth year]  DOA: 03/22/2019 PCP: [name], MD  Brief Narrative:  73 y.o. female with medical history significant of lung cancer on radiation therapy and chemotherapy (last session on 11/27 received etoposide and  Neulasta  ), COPD, HLD, HTN presented with odonophagia due to severe oral thrush as well as fever. She reports chronic cough and outpatient attempts to reduced antihypertensives in concern for low BP ED course: Tmax 103.79F, BP ranging 87/47--140/70, hr 90-124, RR 14-23. Labs significant for WBC 0.1, Hgb 7.7, Platelets 36, chl 95, sodium 132, glucose 120, lactate 0.7. COVID -ve. CXR showed-no active cardiopulmonary disease. Emphysematous disease and chronic interstitial opacities Recent CT on 11/27 had shown improved size of lung nodules and LN. U/A unremarkable. Blood cx sent. Patient recieved vanc and aztreonam. Case was discussed by ED physician with Dr Irene Limbo who recommended admit to medicine for IV ABX and IVF. No need for neutrophil stimulating factors Hospital course: Admitted to [facility] service with IV fluids and  broad spectrum antibiotics (Vanc/Aztreonam/Fluconazole/Flagyl ) for neutropenic fever/ severe mucositis. Blood cx so far no growth, MRSA PCR -ve. Patient afebrile . Admitting provider notified Dr Inda Merlin regarding patient's presentation/admission. Pharmacy recommended d/c flagyl .  Discussions [name] with infectious diseases on call, oncology teamas well as patient's primary cardiologist Dr. Virgina Jock  on 12/1 (given episodes of hypotension requiring IV hydration/midodrine while holding aspirin/nitrates/beta-blockers in concern for thrombocytopenia and hypotension).  Patient required IV hydration as well as midodrine as needed to maintain blood pressure on 12/1.  She also received 1 unit of PRBC as ordered by oncology team.  On 12/2 patient noted to be hypoxic requiring 6 L of O2-stat chest x-ray showed complete whiteout  of the right lung likely related to postobstructive atelectasis versus mucous plug.  Patient also went into fib with RVR with heart rate 170-200s requiring metoprolol IV 2.5 mg x 1.  Lab work resulted as worsening pancytopenia with hemoglobin 6.7, WBC less than 0.1 and platelet less than 5.  Seen by palliative care team and after discussions with patient and family (son and daughter)-comfort care measures initiated.  Subjective: Patient now transferred to sixth floor.  She appears sedated but overall comfortable, mildly tachypneic.  Son daughter and granddaughter at bedside, expressed satisfaction and gratitude with care provided. Objective: Vitals:   04/03/2019 1300 04/16/2019 1431 04/13/2019 1500 05/03/2019 1600  BP:   (!) 78/60   Pulse:   (!) 156   Resp:   20   Temp: 99.1 F (37.3 C)   98.9 F (37.2 C)  TempSrc: Axillary   Axillary  SpO2:  97% 100%   Weight:      Height:        Intake/Output Summary (Last 24 hours) at 04/05/2019 1623 Last data filed at 04/05/2019 6503 Gross per 24 hour  Intake -  Output 750 ml  Net -750 ml   Filed Weights   03/15/2019 1542 03/10/2019 1546  Weight: 47.6 kg 47.6 kg    Physical Examination:  General exam: Cachectic female, on comfort measures, appears mildly tachypneic,  Respiratory system: Reduced breath sounds on the right side compared to left Cardiovascular system: S1 & S2 heard, irregular, tachycardic.  Gastrointestinal system: Abdomen is nondistended, soft and nontender. No organomegaly or masses felt. Normal bowel sounds heard. Central nervous system: Alert and oriented x3. No new focal neurological deficits. Extremities: No contractures, edema or joint deformities.    Data Reviewed: I have personally reviewed following  labs and imaging studies  CBC: Recent Labs  Lab 03/17/2019 1452 03/27/2019 1640 04/03/19 0216 04/03/19 1629 04/03/19 1805 05/01/2019 1224  WBC 0.1* 0.1* 0.1*  --   --  0.1*  NEUTROABS  --  0.0*  --   --   --   --   HGB 7.8*  7.7* 7.3* 7.0* 7.4* 6.7*  HCT 22.6* 22.5* 21.9* 20.8* 21.5* 20.0*  MCV 95.0 96.2 98.6  --   --  95.2  PLT 40* 36* 23*  --   --  4*   Basic Metabolic Panel: Recent Labs  Lab 04/01/19 0952 03/07/2019 1452 03/16/2019 1640 04/03/19 0216 04/03/19 1629 04/03/19 1805 05/01/2019 1224  NA 131* 132* 132* 135  --   --  136  K 3.3* 3.5 3.3* 3.2* 3.1* 3.2* 3.0*  CL 91* 95* 95* 104  --   --  103  CO2 27 26 25 22   --   --  23  GLUCOSE 112* 120* 122* 109*  --   --  96  BUN 10 9 11 8   --   --  7*  CREATININE 0.62 0.66 0.60 0.45  --   --  0.44  CALCIUM 9.1 8.7* 8.6* 7.7*  --   --  7.8*  MG  --   --   --  1.3*  --   --   --   PHOS  --   --   --  2.0*  --   --   --    GFR: Estimated Creatinine Clearance: 46.4 mL/min (by C-G formula based on SCr of 0.44 mg/dL). Liver Function Tests: Recent Labs  Lab 03/05/2019 1452 03/09/2019 1640 04/03/19 0216 04/20/2019 1224  AST 12* 13* 12* 21  ALT 11 14 12 12   ALKPHOS 60 53 53 42  BILITOT 0.8 1.1 0.5 0.9  PROT 6.4* 6.8 5.8* 5.2*  ALBUMIN 3.0* 3.2* 2.7* 2.0*   No results for input(s): LIPASE, AMYLASE in the last 168 hours. No results for input(s): AMMONIA in the last 168 hours. Coagulation Profile: Recent Labs  Lab 03/20/2019 1640  INR 1.2   Cardiac Enzymes: No results for input(s): CKTOTAL, CKMB, CKMBINDEX, TROPONINI in the last 168 hours. BNP (last 3 results) No results for input(s): PROBNP in the last 8760 hours. HbA1C: No results for input(s): HGBA1C in the last 72 hours. CBG: No results for input(s): GLUCAP in the last 168 hours. Lipid Profile: No results for input(s): CHOL, HDL, LDLCALC, TRIG, CHOLHDL, LDLDIRECT in the last 72 hours. Thyroid Function Tests: Recent Labs    04/03/19 0216  TSH 1.885   Anemia Panel: No results for input(s): VITAMINB12, FOLATE, FERRITIN, TIBC, IRON, RETICCTPCT in the last 72 hours. Sepsis Labs: Recent Labs  Lab 04/01/19 3338 04/01/19 1151 03/21/2019 1626 04/01/2019 1640  PROCALCITON  --   --   --  <0.10   LATICACIDVEN 1.1 0.7 1.3 1.1    Recent Results (from the past 240 hour(s))  SARS CORONAVIRUS 2 (TAT 6-24 HRS) Nasopharyngeal Nasopharyngeal Swab     Status: None   Collection Time: 04/01/19  9:38 AM   Specimen: Nasopharyngeal Swab  Result Value Ref Range Status   SARS Coronavirus 2 NEGATIVE NEGATIVE Final    Comment: (NOTE) SARS-CoV-2 target nucleic acids are NOT DETECTED. The SARS-CoV-2 RNA is generally detectable in upper and lower respiratory specimens during the acute phase of infection. Negative results do not preclude SARS-CoV-2 infection, do not rule out co-infections with other pathogens, and should not be used as the  sole basis for treatment or other patient management decisions. Negative results must be combined with clinical observations, patient history, and epidemiological information. The expected result is Negative. Fact Sheet for Patients: SugarRoll.be Fact Sheet for Healthcare Providers: https://www.woods-mathews.com/ This test is not yet approved or cleared by the [place] FDA and  has been authorized for detection and/or diagnosis of SARS-CoV-2 by FDA under an Emergency Use Authorization (EUA). This EUA will remain  in effect (meaning this test can be used) for the duration of the COVID-19 declaration under Section 56 4(b)(1) of the Act, 21 U.S.C. section 360bbb-3(b)(1), unless the authorization is terminated or revoked sooner. Performed at [facility], [place] [address], [place], [place] [address]   Culture, blood (routine x 2)     Status: None (Preliminary result)   Collection Time: 04/01/19  9:43 AM   Specimen: BLOOD RIGHT WRIST  Result Value Ref Range Status   Specimen Description BLOOD RIGHT WRIST BOTTLES DRAWN AEROBIC ONLY  Final   Special Requests Blood Culture adequate volume  Final   Culture   Final    NO GROWTH 4 DAYS Performed at [facility], [address] [place], [place] [address]    Report  Status PENDING  Incomplete  Culture, blood (routine x 2)     Status: None (Preliminary result)   Collection Time: 04/01/19  9:52 AM   Specimen: BLOOD RIGHT ARM  Result Value Ref Range Status   Specimen Description   Final    BLOOD RIGHT ARM BOTTLES DRAWN AEROBIC AND ANAEROBIC   Special Requests Blood Culture adequate volume  Final   Culture   Final    NO GROWTH 4 DAYS Performed at [facility], [address], [place], [place] [address]    Report Status PENDING  Incomplete  Culture, blood (Routine x 2)     Status: None (Preliminary result)   Collection Time: 03/29/2019  4:40 PM   Specimen: BLOOD RIGHT FOREARM  Result Value Ref Range Status   Specimen Description   Final    BLOOD RIGHT FOREARM Performed at [facility], [place] [address] [place], [place] [address]    Special Requests   Final    BOTTLES DRAWN AEROBIC AND ANAEROBIC Blood Culture adequate volume Performed at [place] [address] [place], [place] [address]    Culture   Final    NO GROWTH 3 DAYS Performed at [facility], [place] [address], [place], [place] [address]    Report Status PENDING  Incomplete  Culture, blood (Routine x 2)     Status: Abnormal   Collection Time: 03/12/2019  4:40 PM   Specimen: BLOOD LEFT FOREARM  Result Value Ref Range Status   Specimen Description   Final    BLOOD LEFT FOREARM Performed at [facility] [address], [place], [place] [address]    Special Requests   Final    BOTTLES DRAWN AEROBIC AND ANAEROBIC Blood Culture adequate volume Performed at [facility] [address], [place], [place] [address]    Culture  Setup Time   Final    ANAEROBIC BOTTLE ONLY GRAM POSITIVE COCCI CRITICAL RESULT CALLED TO, READ BACK BY AND VERIFIED WITH: Lavell Luster [facility] 04/28/2019 0021 JDW    Culture (A)  Final    MICROCOCCUS SPECIES Standardized susceptibility testing for this organism is not available. Performed  at [facility], [place] [address], [place], [place] [address]    Report  Status 04/05/2019 FINAL  Final  Respiratory Panel by PCR     Status: None   Collection Time: 03/13/2019  8:35 PM   Specimen: Nasopharyngeal Swab; Respiratory  Result Value Ref Range Status   Adenovirus NOT DETECTED NOT DETECTED Final   Coronavirus 229E NOT DETECTED NOT DETECTED Final    Comment: (NOTE) The Coronavirus on the Respiratory Panel, DOES NOT test for the novel  Coronavirus (2019 nCoV)    Coronavirus HKU1 NOT DETECTED NOT DETECTED Final   Coronavirus NL63 NOT DETECTED NOT DETECTED Final   Coronavirus OC43 NOT DETECTED NOT DETECTED Final   Metapneumovirus NOT DETECTED NOT DETECTED Final   Rhinovirus / Enterovirus NOT DETECTED NOT DETECTED Final   Influenza A NOT DETECTED NOT DETECTED Final   Influenza B NOT DETECTED NOT DETECTED Final   Parainfluenza Virus 1 NOT DETECTED NOT DETECTED Final   Parainfluenza Virus 2 NOT DETECTED NOT DETECTED Final   Parainfluenza Virus 3 NOT DETECTED NOT DETECTED Final   Parainfluenza Virus 4 NOT DETECTED NOT DETECTED Final   Respiratory Syncytial Virus NOT DETECTED NOT DETECTED Final   Bordetella pertussis NOT DETECTED NOT DETECTED Final   Chlamydophila pneumoniae NOT DETECTED NOT DETECTED Final   Mycoplasma pneumoniae NOT DETECTED NOT DETECTED Final    Comment: Performed at [facility], [place]. [address] [place], [place] [address]  MRSA PCR Screening     Status: None   Collection Time: 04/03/19 12:22 AM   Specimen: Nasal Mucosa; Nasopharyngeal  Result Value Ref Range Status   MRSA by PCR NEGATIVE NEGATIVE Final    Comment:        The GeneXpert MRSA Assay (FDA approved for NASAL specimens only), is one component of a comprehensive MRSA colonization surveillance program. It is not intended to diagnose MRSA infection nor to guide or monitor treatment for MRSA infections. Performed at [facility], [place] [address], [place], [place]  [address]       Radiology Studies: Dg Chest 1 View  Result Date: 04/27/2019 CLINICAL DATA:  Fluid excess EXAM: CHEST  1 VIEW COMPARISON:  03/12/2019 chest radiograph. FINDINGS: Left subclavian Port-A-Cath terminates in the upper third of the SVC. Stable cardiomediastinal silhouette with normal heart size. No pneumothorax. No left pleural effusion. Near complete opacification of the right hemithorax, new. Volume loss in the right hemithorax, new. Hazy reticular opacities in the lower left lung, worsened. IMPRESSION: 1. New near complete right hemithorax opacification with volume loss. This rapid interval change suggests mucous impaction in the central right airways. Chest radiograph follow-up and/or bronchoscopic evaluation suggested. 2. Worsened hazy reticular opacities at the left lung base, favor atelectasis, cannot exclude a component of aspiration. Electronically Signed   By: Ilona Sorrel M.D.   On: 04/27/2019 11:34        Scheduled Meds: . Chlorhexidine Gluconate Cloth  6 each Topical Daily  . mouth rinse  15 mL Mouth Rinse BID  .  morphine injection  2 mg Intravenous NOW  . potassium chloride  40 mEq Oral Once  . sodium chloride flush  10-40 mL Intracatheter Q12H   Continuous Infusions:   Assessment & Plan:   1. Neutropenic fever with unclear source of sepsis.  Off antibiotics 2.  Acute hypoxic respiratory failure: Now on 2 L nasal cannula/neb treatment as needed for comfort.  3.Rapid atrial fibrillation with RVR: Now off monitor with comfort measures. 5.  Severe pancytopenia: in the setting of chemotherapy/radiation/sepsis.  She received blood transfusion on 12/1. Her risk of  spontaneous bleeding is extremely high at this time. 6. CAD/ unstable angina: Morphine as needed for comfort  7.Small cell Lung ca : T3, N3, M0- with right upper lobe lung mass in addition to satellite nodule as well as right hilar, mediastinal and right supraclavicular lymphadenopathy diagnosed in September  2020. First cycle on 02/12/2019. Last session on 11/27.  Appreciate oncology evaluation. 8. COPD/Emphysema :Nebs prn 9. Hypokalemia: She has received p.o./IV replacement as needed during the hospital course Code Status: DNR Family / Patient Communication: d/w family including daughter, granddaughter and son-Travis at bedside again today.  They appreciate care provided.  Discussed with Dr. Domingo Cocking Disposition Plan: Likely will not survive this hospitalization     LOS: 2 days    Total time spent: 25 minutes   Guilford Shi, MD [facility] Pager [phone]  If 7PM-7AM, please contact night-coverage [email or web address] Password [facility] 04/05/2019, 4:23 PM

## 2019-04-05 NOTE — Progress Notes (Signed)
Daily Progress Note   Patient Name: Stacy [name]       Date: 04/05/2019 DOB: 12-02-1945  Age: 73 y.o. MRN#: [id number] Attending Physician: Guilford Shi, MD Primary Care Physician: [name], MD Admit Date: 03/12/2019  Reason for Consultation/Follow-up: Establishing goals of care and Terminal Care  Subjective: I saw and examined Stacy [name] today.  She is much more lethargic today.  Daughter and son at bedside and report that she was having increased secretions that improved with suctioning.  She reports continuing to have pain in her chest despite recent dose of pain medication.  Length of Stay: 2  Current Medications: Scheduled Meds:  . Chlorhexidine Gluconate Cloth  6 each Topical Daily  . mouth rinse  15 mL Mouth Rinse BID  .  morphine injection  2 mg Intravenous NOW  . potassium chloride  40 mEq Oral Once  . sodium chloride flush  10-40 mL Intracatheter Q12H    Continuous Infusions:   PRN Meds: acetaminophen **OR** acetaminophen, antiseptic oral rinse, glycopyrrolate **OR** glycopyrrolate **OR** glycopyrrolate, haloperidol **OR** haloperidol **OR** haloperidol lactate, ipratropium-albuterol, LORazepam, magic mouthwash w/lidocaine, morphine injection, nitroGLYCERIN, ondansetron **OR** ondansetron (ZOFRAN) IV, polyvinyl alcohol, sodium chloride flush  Physical Exam       General: Sleepy but arousable. Frail and cachectic.  Heart: Irregular, tachycardic. No murmur appreciated. Lungs: Decreased air movement, esp on R Abdomen: Soft, nontender, nondistended, positive bowel sounds.  Ext: No significant edema Skin: Warm and dry    Vital Signs: BP (!) 78/60   Pulse (!) 156   Temp 98.9 F (37.2 C) (Axillary)   Resp 20   Ht 5' 1.5" (1.562 m)   Wt 47.6 kg    SpO2 100%   BMI 19.52 kg/m  SpO2: SpO2: 100 % O2 Device: O2 Device: Nasal Cannula O2 Flow Rate: O2 Flow Rate (L/min): 2 L/min  Intake/output summary:   Intake/Output Summary (Last 24 hours) at 04/05/2019 1225 Last data filed at 04/05/2019 6825 Gross per 24 hour  Intake -  Output 750 ml  Net -750 ml   LBM: Last BM Date: 04/03/19 Baseline Weight: Weight: 47.6 kg Most recent weight: Weight: 47.6 kg       Palliative Assessment/Data:    Flowsheet Rows     Most Recent Value  Intake Tab  Referral Department  Hospitalist  Unit  at Time of Referral  Intermediate Care Unit  Palliative Care Primary Diagnosis  Cancer  Date Notified  04/25/2019  Palliative Care Type  New Palliative care  Reason for referral  Clarify Goals of Care  Date of Admission  03/16/2019  Date first seen by Palliative Care  04/17/2019  # of days Palliative referral response time  0 Day(s)  # of days IP prior to Palliative referral  2  Clinical Assessment  Palliative Performance Scale Score  20%  Psychosocial & Spiritual Assessment  Palliative Care Outcomes  Patient/Family meeting held?  Yes  Who was at the meeting?  Patient, son and daughter (via phone)      Patient Active Problem List   Diagnosis Date Noted  . Thrush   . Sepsis ([place]) 04/03/2019  . Neutropenic fever ([place]) 03/17/2019  . Weight loss 03/26/2019  . Chemotherapy induced neutropenia ([place]) 02/26/2019  . Small cell lung cancer, overlapping sites of right lung ([place]) 01/29/2019  . Encounter for antineoplastic chemotherapy 01/29/2019  . Goals of care, counseling/discussion 01/29/2019  . Exertional chest pain 01/23/2019  . Peripheral artery disease ([place]) 01/23/2019  . Malignant neoplasm of upper lobe of right lung ([place]) 01/11/2019  . Insomnia 05/19/2018  . Smokers' cough ([place]) 06/23/2017  . Atherosclerosis of aorta ([place]) 06/23/2017  . History of diverticulitis 06/23/2017  . COPD (chronic obstructive pulmonary disease) ([place]) 04/02/2017  .  Interstitial lung disease ([place]) 04/02/2017  . Tobacco abuse 03/23/2017  . BMI 20.0-20.9, adult 02/17/2015  . Poor compliance with medication 02/17/2015  . Medication management 06/10/2014  . Hypertension   . Mixed hyperlipidemia   . Vitamin D deficiency   . Prediabetes   . DJD (degenerative joint disease)     Palliative Care Assessment & Plan   Patient Profile: 73 y.o. female  with past medical history of COPD, small cell lung cancer with right-sided lymphadenopathy currently undergoing chemoradiation with complication of neutropenia admitted on 03/11/2019 with febrile neutropenia and sepsis.  Now full comfort care.  Palliative consulted for [place] and EOL assistance.     Assessment: Patient Active Problem List   Diagnosis Date Noted  . Thrush   . Sepsis ([place]) 04/03/2019  . Neutropenic fever ([place]) 03/14/2019  . Weight loss 03/26/2019  . Chemotherapy induced neutropenia ([place]) 02/26/2019  . Small cell lung cancer, overlapping sites of right lung ([place]) 01/29/2019  . Encounter for antineoplastic chemotherapy 01/29/2019  . Goals of care, counseling/discussion 01/29/2019  . Exertional chest pain 01/23/2019  . Peripheral artery disease ([place]) 01/23/2019  . Malignant neoplasm of upper lobe of right lung ([place]) 01/11/2019  . Insomnia 05/19/2018  . Smokers' cough ([place]) 06/23/2017  . Atherosclerosis of aorta ([place]) 06/23/2017  . History of diverticulitis 06/23/2017  . COPD (chronic obstructive pulmonary disease) ([place]) 04/02/2017  . Interstitial lung disease ([place]) 04/02/2017  . Tobacco abuse 03/23/2017  . BMI 20.0-20.9, adult 02/17/2015  . Poor compliance with medication 02/17/2015  . Medication management 06/10/2014  . Hypertension   . Mixed hyperlipidemia   . Vitamin D deficiency   . Prediabetes   . DJD (degenerative joint disease)     Recommendations/Plan: - Full comfort - Orders updated using EOL order set.             Pain/SOB: Morphine as needed.  Additional dose now as  reporting pain after receiving dose a little while ago.  Low threshold to initiate continuous infusion if needed for comfort.  Anxiety: Ativan as needed             Excess secretions: Robinul as needed             Agitation: Haldol as needed             Foley for comfort. - Met with family, including son and daughter at bedside.  Reviewed plan for comfort moving forward.  They report no concerns at this point in time. -Visitor exceptions per end-of-life visitation policy. -Anticipate this will be a terminal admission.  Goals of Care and Additional Recommendations:  Limitations on Scope of Treatment: Full Comfort Care  Code Status:    Code Status Orders  (From admission, onward)         Start     Ordered   04/03/2019 1556  Do not attempt resuscitation (DNR)  Continuous    Question Answer Comment  In the event of cardiac or respiratory ARREST Do not call a "code blue"   In the event of cardiac or respiratory ARREST Do not perform Intubation, CPR, defibrillation or ACLS   In the event of cardiac or respiratory ARREST Use medication by any route, position, wound care, and other measures to relive pain and suffering. May use oxygen, suction and manual treatment of airway obstruction as needed for comfort.      04/08/2019 1556        Code Status History    Date Active Date Inactive Code Status Order ID Comments User Context   04/03/2019 0026 04/19/2019 1556 DNR [id number]  Toy Baker, MD Inpatient   Advance Care Planning Activity    Advance Directive Documentation     Most Recent Value  Type of Advance Directive  Healthcare Power of Attorney, Living will  Pre-existing out of facility DNR order (yellow form or pink MOST form)  -  "MOST" Form in Place?  -       Prognosis:   Hours - Days  Discharge Planning:  Anticipated Hospital Death  Care plan was discussed with son, daughter, RN, Dr. Earnest Conroy  Thank you for allowing the Palliative Medicine Team to  assist in the care of this patient.   Time In: 1130 Time Out: 1200 Total Time 30 Prolonged Time Billed No      Greater than 50%  of this time was spent counseling and coordinating care related to the above assessment and plan.  Micheline Rough, MD  Please contact Palliative Medicine Team phone at [phone] for questions and concerns.

## 2019-04-06 LAB — CULTURE, BLOOD (ROUTINE X 2)
Culture: NO GROWTH
Culture: NO GROWTH
Special Requests: ADEQUATE
Special Requests: ADEQUATE

## 2019-04-06 MED ORDER — MORPHINE BOLUS VIA INFUSION
2.0000 mg | INTRAVENOUS | Status: DC | PRN
  Filled 2019-04-06: qty 2

## 2019-04-06 MED ORDER — MORPHINE 100MG IN NS 100ML (1MG/ML) PREMIX INFUSION
1.0000 mg/h | INTRAVENOUS | Status: DC
  Administered 2019-04-06: 12:00:00 1 mg/h via INTRAVENOUS
  Filled 2019-04-06: qty 100

## 2019-04-06 MED ORDER — LORAZEPAM 2 MG/ML IJ SOLN
1.0000 mg | INTRAMUSCULAR | Status: DC
  Administered 2019-04-06 (×2): 1 mg via INTRAVENOUS
  Filled 2019-04-06 (×2): qty 1

## 2019-04-06 MED ORDER — GLYCOPYRROLATE 0.2 MG/ML IJ SOLN
0.4000 mg | INTRAMUSCULAR | Status: DC
  Administered 2019-04-06 (×2): 0.4 mg via INTRAVENOUS
  Filled 2019-04-06 (×2): qty 2

## 2019-04-06 MED ORDER — LORAZEPAM 2 MG/ML IJ SOLN: 1.0000 mg | INTRAMUSCULAR | Status: DC | PRN

## 2019-04-07 LAB — CULTURE, BLOOD (ROUTINE X 2)
Culture: NO GROWTH
Special Requests: ADEQUATE

## 2019-04-09 ENCOUNTER — Inpatient Hospital Stay: Payer: Medicare HMO

## 2019-04-11 ENCOUNTER — Encounter: Payer: Self-pay | Admitting: *Deleted

## 2019-04-16 ENCOUNTER — Other Ambulatory Visit: Payer: Medicare HMO

## 2019-04-16 ENCOUNTER — Ambulatory Visit: Payer: Medicare HMO

## 2019-04-16 ENCOUNTER — Encounter: Payer: Medicare HMO | Admitting: Nutrition

## 2019-04-16 ENCOUNTER — Ambulatory Visit: Payer: Medicare HMO | Admitting: Internal Medicine

## 2019-04-17 ENCOUNTER — Ambulatory Visit: Payer: Medicare HMO | Admitting: Internal Medicine

## 2019-04-17 ENCOUNTER — Ambulatory Visit: Payer: Medicare HMO

## 2019-04-18 ENCOUNTER — Ambulatory Visit: Payer: Medicare HMO

## 2019-04-19 ENCOUNTER — Ambulatory Visit: Payer: Medicare HMO

## 2019-04-20 ENCOUNTER — Ambulatory Visit: Payer: Medicare HMO

## 2019-05-04 NOTE — Care Management Important Message (Signed)
Important Message  Patient Details IM Letter given to Marney Doctor SW to present to the Patient Name: Stacy [name] MRN: [id number] Date of Birth: 06-25-1945   Medicare Important Message Given:  Yes     Kerin Salen April 11, 2019, 10:41 AM

## 2019-05-04 NOTE — Discharge Summary (Signed)
Death Summary  Stacy [name] LAG:[id number] DOB: 1945/06/14 DOA: 04/03/19  PCP: No primary care provider on file.  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 She reports chronic cough and outpatient attempts to reduced antihypertensives in concern for low BP ED course: Tmax 103.1F, BP ranging 87/47--140/70, hr 90-124, RR 14-23. Labs significant for WBC 0.1, Hgb 7.7, Platelets 36, chl 95, sodium 132, glucose 120, lactate 0.7. COVID -ve. CXR showed-no active cardiopulmonary disease. Emphysematous disease and chronic interstitial opacities Recent CT on 11/27 had shown improved size of lung nodules and LN. U/A unremarkable. Blood cx sent. Patient recieved vanc and aztreonam. Case was discussed by ED physician with Dr Irene Limbo who recommended admit to medicinefor IV ABX and IVF. No need for neutrophil stimulating factors Hospital course: Admitted to [facility] service with IV fluids and  broad spectrum antibiotics (Vanc/Aztreonam/Fluconazole/Flagyl ) for neutropenic fever/ severe mucositis. Blood cx so far no growth, MRSA PCR -ve. Patient afebrile . Admitting provider notified Dr Inda Merlin regarding patient's presentation/admission. Pharmacy recommended d/c flagyl .  Discussions held with infectious diseases on call, oncology teamas well as patient's primary cardiologist Dr. Virgina Jock   on 12/1 (given episodes of hypotension requiring IV hydration/midodrine while holding aspirin/nitrates/beta-blockers in concern for thrombocytopenia and hypotension).  Patient required IV hydration as well as midodrine as needed to maintain blood pressure on 12/1.  She also received 1 unit of PRBC as ordered by oncology team.  On 12/2 patient noted to be hypoxic requiring 6 L of O2-stat chest x-ray showed complete whiteout of the right lung likely related to postobstructive atelectasis versus mucous plug.  Patient also went into fib with RVR with heart rate 170-200s requiring metoprolol IV 2.5 mg x 1.  Lab work resulted as worsening pancytopenia with hemoglobin 6.7, WBC less than 0.1 and platelet less than 5.  Seen by palliative care team and after discussions with patient and family (son and daughter)-comfort care measures initiated  1. Neutropenic fever with unclear source of sepsis.  Blood culture 1 out of 4 bottles reported gram-positive cocci-Aerococcus-likely contamination and unrelated to Port-A-Cath.  Off antibiotics since transition to comfort care 2.  Acute hypoxic respiratory failure: Was placed on 2 L nasal cannula O2/neb treatment as needed for comfort.  3.Rapid atrial fibrillation with RVR: Discontinued cardiac monitor with comfort measures. 5.  Severe pancytopenia: in the setting of chemotherapy/radiation/sepsis.  She received blood transfusion on 12/1. Her risk of spontaneous bleeding is extremely high at this time. 6. CAD/ unstable angina: Morphine as needed for comfort provided 7.Small cell Lung ca : T3, N3, M0- with right upper lobe lung mass in addition to satellite nodule as well as right hilar, mediastinal and right supraclavicular lymphadenopathy diagnosed in September 2020. First cycle on 02/12/2019. Last session on 11/27.   8. COPD/Emphysema :Nebs prn 9. Hypokalemia: She has received p.o./IV replacement as needed during the hospital course 10.  Moderate-severe protein calorie  malnutrition/with failure to thrive: Due to advanced malignancy.  Code Status: DNR Family / Patient Communication: d/w family daily during rounds and comforted them.  They appeared to be at peace with decisions made.They appreciated care provided.  Patient was also followed by palliative care team very closely and comfort care medications were titrated as needed.   Per medical record documentation, patient expired on 05-06-2019 at 2130 with family at bedside.   The results of significant diagnostics from this hospitalization (including imaging, microbiology, ancillary and laboratory) are listed below for reference.    Significant Diagnostic Studies: DG Chest 1 View  Result Date: 04/14/2019 CLINICAL DATA:  Fluid excess EXAM: CHEST  1 VIEW COMPARISON:  03/22/2019 chest radiograph. FINDINGS: Left subclavian Port-A-Cath terminates in the upper third of the SVC. Stable cardiomediastinal silhouette with normal heart size. No pneumothorax. No left pleural effusion. Near complete opacification of the right hemithorax, new. Volume loss in the right hemithorax, new. Hazy reticular opacities in the lower left lung, worsened. IMPRESSION: 1. New near complete right hemithorax opacification with volume loss. This rapid interval change suggests mucous impaction in the central right airways. Chest radiograph follow-up and/or bronchoscopic evaluation suggested. 2. Worsened hazy reticular opacities at the left lung base, favor atelectasis, cannot exclude a component of aspiration. Electronically Signed   By: Ilona Sorrel M.D.   On: 05/03/2019 11:34   DG Chest 2 View  Result Date: 03/10/2019 CLINICAL DATA:  Shortness of breath and chest pain EXAM: CHEST - 2 VIEW COMPARISON:  04/01/2019, 01/22/2019 FINDINGS: Left-sided central venous port with tip over the SVC. Emphysematous disease. Stable to slight decreased subpleural right upper lobe lung mass. Chronic interstitial changes. No acute consolidation or effusion. Normal  heart size. Aortic atherosclerosis. No pneumothorax. IMPRESSION: No active cardiopulmonary disease. Emphysematous disease and chronic interstitial opacities. No acute airspace disease. Stable to slight decreased subpleural right upper lobe lung mass Electronically Signed   By: Donavan Foil M.D.   On: 03/11/2019 18:09   CT Chest W Contrast  Result Date: 03/30/2019 CLINICAL DATA:  Patient with history of small cell lung carcinoma patient undergoing EXAM: CT CHEST WITH CONTRAST TECHNIQUE: Multidetector CT imaging of the chest was performed during intravenous contrast administration. CONTRAST:  5mL OMNIPAQUE IOHEXOL 300 MG/ML  SOLN COMPARISON:  PET-CT 01/03/2019 FINDINGS: Cardiovascular: Normal heart size. Thoracic aortic vascular calcifications. Trace fluid superior pericardial recess. Mediastinum/Nodes: Interval decrease in size of posterior right paratracheal lymph node measuring 5 mm (image 22; series 2), previously 8 mm. Interval decrease in size of 1.1 cm precarinal lymph node (image 52; series 2), previously 2.8 cm. Interval decrease in size of subcarinal lymph node measuring 0.8 cm, previously 1.6 cm. Significant interval decrease in size of previously visualized right hilar adenopathy now with small volume residual infiltrative soft tissue (image 56; series 2). Normal appearance of the esophagus. Lungs/Pleura: Central airways are patent. Centrilobular and paraseptal emphysematous changes. Similar-appearing bilateral subpleural reticular opacities. Interval decrease in size of subpleural nodule right upper lobe measuring 2.4 x 1 cm (image 30 series 7), previously 3.9 x 2.8 cm. Interval decrease in size of right upper lobe nodule measuring 0.7 x 0.8 cm (image 40; series 7), previously 2.0 x 1.9 cm. No pleural effusion or pneumothorax. Upper Abdomen: No acute process. Musculoskeletal: Thoracic spine degenerative changes. No aggressive or acute appearing osseous lesions. IMPRESSION: 1. Interval decrease in  size of right upper lobe nodules when compared to prior exam compatible with treatment response. 2. Interval decrease in size of mediastinal and right hilar adenopathy. 3. Emphysema and aortic atherosclerosis. Electronically Signed  By: Lovey Newcomer M.D.   On: 03/30/2019 14:35   DG Chest Port 1 View  Result Date: 04/01/2019 CLINICAL DATA:  Weakness and dehydration.  History of lung carcinoma EXAM: PORTABLE CHEST 1 VIEW COMPARISON:  Chest CT March 30, 2019 and chest radiograph February 19, 2019. FINDINGS: Underlying emphysematous change noted. Nodular lesion in the periphery of the right upper lobe measuring 1.5 x 1.7 cm is noted, better seen on recent CT. There are scattered areas of fibrosis. No edema or consolidation evident. Heart size and pulmonary vascularity are normal. No adenopathy. Port-A-Cath tip is in the superior vena cava. There is aortic atherosclerosis. No evident bone lesions. IMPRESSION: Underlying emphysematous change in fibrosis. Nodular opacity in the periphery of the right upper lobe again noted. No edema or consolidation. Stable cardiac silhouette. No adenopathy. Port-A-Cath tip in superior vena cava. Aortic Atherosclerosis (ICD10-I70.0). Electronically Signed   By: Lowella Grip III M.D.   On: 04/01/2019 09:49    Microbiology: No results found for this or any previous visit (from the past 240 hour(s)).   Labs: Basic Metabolic Panel: No results for input(s): NA, K, CL, CO2, GLUCOSE, BUN, CREATININE, CALCIUM, MG, PHOS in the last 168 hours. Liver Function Tests: No results for input(s): AST, ALT, ALKPHOS, BILITOT, PROT, ALBUMIN in the last 168 hours. No results for input(s): LIPASE, AMYLASE in the last 168 hours. No results for input(s): AMMONIA in the last 168 hours. CBC: No results for input(s): WBC, NEUTROABS, HGB, HCT, MCV, PLT in the last 168 hours. Cardiac Enzymes: No results for input(s): CKTOTAL, CKMB, CKMBINDEX, TROPONINI in the last 168 hours. D-Dimer No  results for input(s): DDIMER in the last 72 hours. BNP: Invalid input(s): POCBNP CBG: No results for input(s): GLUCAP in the last 168 hours. Anemia work up No results for input(s): VITAMINB12, FOLATE, FERRITIN, TIBC, IRON, RETICCTPCT in the last 72 hours. Urinalysis    Component Value Date/Time   COLORURINE YELLOW 03/23/2019 1640   APPEARANCEUR CLEAR 03/06/2019 1640   LABSPEC 1.011 03/08/2019 1640   PHURINE 5.0 03/08/2019 1640   GLUCOSEU NEGATIVE 03/28/2019 1640   HGBUR MODERATE (A) 03/30/2019 1640   BILIRUBINUR NEGATIVE 03/14/2019 1640   KETONESUR 5 (A) 03/05/2019 1640   PROTEINUR NEGATIVE 03/21/2019 1640   UROBILINOGEN 0.2 11/11/2014 1503   NITRITE NEGATIVE 03/07/2019 1640   LEUKOCYTESUR NEGATIVE 03/10/2019 1640   Sepsis Labs Invalid input(s): PROCALCITONIN,  WBC,  LACTICIDVEN     SIGNED:  Guilford Shi, MD  [facility] 04/17/2019, 8:16 PM Pager   If 7PM-7AM, please contact night-coverage [email or web address] Password [email or web address]

## 2019-05-04 NOTE — Progress Notes (Signed)
Pt expired 2130. Family present & house coverage notified. Pt taken to morgue. Wasted 85mg (1mg /76ml concentration) with Park Pope, RN in pyxis/stericycle.

## 2019-05-04 NOTE — Progress Notes (Signed)
Daily Progress Note   Patient Name: Stacy [name]       Date: 27-Apr-2019 DOB: 1946-03-11  Age: 74 y.o. MRN#: [id number] Attending Physician: Guilford Shi, MD Primary Care Physician: [name], MD Admit Date: 03/16/2019  Reason for Consultation/Follow-up: Establishing goals of care and Terminal Care  Subjective: I saw and examined Ms. Stacy [name] today.  She is no longer waking at all.  With increased secretions as well.  Daughter and granddaughter at bedside.    MAR reviewed and now requiring regular morphine, robinul, and ativan to maintain comfort.  Discussed with daughter and family preference is to schedule medication and start continuous opioid infusion as she is starting to get agitated at end of dosing interval.  Length of Stay: 3  Current Medications: Scheduled Meds:  . glycopyrrolate  0.4 mg Intravenous Q4H  . LORazepam  1 mg Intravenous Q4H  . mouth rinse  15 mL Mouth Rinse BID  . sodium chloride flush  10-40 mL Intracatheter Q12H    Continuous Infusions: . morphine      PRN Meds: acetaminophen **OR** acetaminophen, antiseptic oral rinse, haloperidol **OR** haloperidol **OR** haloperidol lactate, ipratropium-albuterol, LORazepam, magic mouthwash w/lidocaine, morphine injection, morphine, nitroGLYCERIN, ondansetron **OR** ondansetron (ZOFRAN) IV, polyvinyl alcohol, sodium chloride flush  Physical Exam       General: Does not arouse today. Frail and cachectic.  Heart: Irregular, tachycardic. No murmur appreciated. Lungs: Decreased air movement, esp on R Abdomen: Soft, nontender, nondistended, positive bowel sounds.  Ext: No significant edema Skin: Warm and dry    Vital Signs: BP (!) 78/60   Pulse (!) 156   Temp 98.9 F (37.2 C) (Axillary)   Resp 20    Ht 5' 1.5" (1.562 m)   Wt 47.6 kg   SpO2 100%   BMI 19.52 kg/m  SpO2: SpO2: 100 % O2 Device: O2 Device: Nasal Cannula O2 Flow Rate: O2 Flow Rate (L/min): 2 L/min  Intake/output summary:   Intake/Output Summary (Last 24 hours) at 27-Apr-2019 1102 Last data filed at 27-Apr-2019 0900 Gross per 24 hour  Intake 0 ml  Output -  Net 0 ml   LBM: Last BM Date: 04/03/19 Baseline Weight: Weight: 47.6 kg Most recent weight: Weight: 47.6 kg       Palliative Assessment/Data:    Flowsheet Rows     Most  Recent Value  Intake Tab  Referral Department  Hospitalist  Unit at Time of Referral  Intermediate Care Unit  Palliative Care Primary Diagnosis  Cancer  Date Notified  04/15/2019  Palliative Care Type  New Palliative care  Reason for referral  Clarify Goals of Care  Date of Admission  03/29/2019  Date first seen by Palliative Care  04/26/2019  # of days Palliative referral response time  0 Day(s)  # of days IP prior to Palliative referral  2  Clinical Assessment  Palliative Performance Scale Score  20%  Psychosocial & Spiritual Assessment  Palliative Care Outcomes  Patient/Family meeting held?  Yes  Who was at the meeting?  Patient, son and daughter (via phone)      Patient Active Problem List   Diagnosis Date Noted  . Thrush   . Sepsis ([place]) 04/03/2019  . Neutropenic fever ([place]) 04/01/2019  . Weight loss 03/26/2019  . Chemotherapy induced neutropenia ([place]) 02/26/2019  . Small cell lung cancer, overlapping sites of right lung ([place]) 01/29/2019  . Encounter for antineoplastic chemotherapy 01/29/2019  . Goals of care, counseling/discussion 01/29/2019  . Exertional chest pain 01/23/2019  . Peripheral artery disease ([place]) 01/23/2019  . Malignant neoplasm of upper lobe of right lung ([place]) 01/11/2019  . Insomnia 05/19/2018  . Smokers' cough ([place]) 06/23/2017  . Atherosclerosis of aorta ([place]) 06/23/2017  . History of diverticulitis 06/23/2017  . COPD (chronic obstructive pulmonary  disease) ([place]) 04/02/2017  . Interstitial lung disease ([place]) 04/02/2017  . Tobacco abuse 03/23/2017  . BMI 20.0-20.9, adult 02/17/2015  . Poor compliance with medication 02/17/2015  . Medication management 06/10/2014  . Hypertension   . Mixed hyperlipidemia   . Vitamin D deficiency   . Prediabetes   . DJD (degenerative joint disease)     Palliative Care Assessment & Plan   Patient Profile: 74 y.o. female  with past medical history of COPD, small cell lung cancer with right-sided lymphadenopathy currently undergoing chemoradiation with complication of neutropenia admitted on 03/17/2019 with febrile neutropenia and sepsis.  Now full comfort care.  Palliative consulted for [place] and EOL assistance.     Assessment: Patient Active Problem List   Diagnosis Date Noted  . Thrush   . Sepsis ([place]) 04/03/2019  . Neutropenic fever ([place]) 03/15/2019  . Weight loss 03/26/2019  . Chemotherapy induced neutropenia ([place]) 02/26/2019  . Small cell lung cancer, overlapping sites of right lung ([place]) 01/29/2019  . Encounter for antineoplastic chemotherapy 01/29/2019  . Goals of care, counseling/discussion 01/29/2019  . Exertional chest pain 01/23/2019  . Peripheral artery disease ([place]) 01/23/2019  . Malignant neoplasm of upper lobe of right lung ([place]) 01/11/2019  . Insomnia 05/19/2018  . Smokers' cough ([place]) 06/23/2017  . Atherosclerosis of aorta ([place]) 06/23/2017  . History of diverticulitis 06/23/2017  . COPD (chronic obstructive pulmonary disease) ([place]) 04/02/2017  . Interstitial lung disease ([place]) 04/02/2017  . Tobacco abuse 03/23/2017  . BMI 20.0-20.9, adult 02/17/2015  . Poor compliance with medication 02/17/2015  . Medication management 06/10/2014  . Hypertension   . Mixed hyperlipidemia   . Vitamin D deficiency   . Prediabetes   . DJD (degenerative joint disease)     Recommendations/Plan: - Full comfort - Orders updated using EOL order set.             Pain/SOB: 24m in last 24 hours  and frequency of need increasing.  Discussed with daughter and will start low dose continuous infusion.  Also continue with additional prn dosing  and will titrate as needed to maintain comfort..             Anxiety: Scheduled ativan             Excess secretions: Scheduled Robinul              Agitation: Haldol as needed             [place] for comfort. - Met with family, including daughter at bedside.  Reviewed plan for comfort moving forward.  They report no concerns at this point in time other than request for scheduling of medications. -Visitor exceptions per end-of-life visitation policy. -Anticipate this will be a terminal admission.  Goals of Care and Additional Recommendations:  Limitations on Scope of Treatment: Full Comfort Care  Code Status:    Code Status Orders  (From admission, onward)         Start     Ordered   04/03/2019 1556  Do not attempt resuscitation (DNR)  Continuous    Question Answer Comment  In the event of cardiac or respiratory ARREST Do not call a "code blue"   In the event of cardiac or respiratory ARREST Do not perform Intubation, CPR, defibrillation or ACLS   In the event of cardiac or respiratory ARREST Use medication by any route, position, wound care, and other measures to relive pain and suffering. May use oxygen, suction and manual treatment of airway obstruction as needed for comfort.      05/03/2019 1556        Code Status History    Date Active Date Inactive Code Status Order ID Comments User Context   04/03/2019 0026 04/03/2019 1556 DNR [id number]  Toy Baker, MD Inpatient   Advance Care Planning Activity    Advance Directive Documentation     Most Recent Value  Type of Advance Directive  Healthcare Power of Attorney, Living will  Pre-existing out of facility DNR order (yellow form or pink MOST form)  -  "MOST" Form in Place?  -       Prognosis:   Hours - Days  Discharge Planning:  Anticipated Hospital Death  Care plan was  discussed with son, daughter, RN, Dr. Earnest Conroy  Thank you for allowing the Palliative Medicine Team to assist in the care of this patient.   Time In: 1030 Time Out: 1105 Total Time 35 Prolonged Time Billed No      Greater than 50%  of this time was spent counseling and coordinating care related to the above assessment and plan.  Micheline Rough, MD  Please contact Palliative Medicine Team phone at [phone] for questions and concerns.

## 2019-05-04 NOTE — Progress Notes (Addendum)
PROGRESS NOTE    Stacy [name]  ZOX:[id number]  DOB: 1945-07-10  DOA: 03/05/2019 PCP: [name], MD  Brief Narrative:  74 y.o. female with medical history significant of lung cancer on radiation therapy and chemotherapy (last session on 11/27 received etoposide and  Neulasta  ), COPD, HLD, HTN presented with odonophagia due to severe oral thrush as well as fever. She reports chronic cough and outpatient attempts to reduced antihypertensives in concern for low BP ED course: Tmax 103.33F, BP ranging 87/47--140/70, hr 90-124, RR 14-23. Labs significant for WBC 0.1, Hgb 7.7, Platelets 36, chl 95, sodium 132, glucose 120, lactate 0.7. COVID -ve. CXR showed-no active cardiopulmonary disease. Emphysematous disease and chronic interstitial opacities Recent CT on 11/27 had shown improved size of lung nodules and LN. U/A unremarkable. Blood cx sent. Patient recieved vanc and aztreonam. Case was discussed by ED physician with Dr Irene Limbo who recommended admit to medicine for IV ABX and IVF. No need for neutrophil stimulating factors Hospital course: Admitted to [facility] service with IV fluids and  broad spectrum antibiotics (Vanc/Aztreonam/Fluconazole/Flagyl ) for neutropenic fever/ severe mucositis. Blood cx so far no growth, MRSA PCR -ve. Patient afebrile . Admitting provider notified Dr Inda Merlin regarding patient's presentation/admission. Pharmacy recommended d/c flagyl .  Discussions held with infectious diseases on call, oncology teamas well as patient's primary cardiologist Dr. Virgina Jock  on 12/1 (given episodes of hypotension requiring IV hydration/midodrine while holding aspirin/nitrates/beta-blockers in concern for thrombocytopenia and hypotension).  Patient required IV hydration as well as midodrine as needed to maintain blood pressure on 12/1.  She also received 1 unit of PRBC as ordered by oncology team.  On 12/2 patient noted to be hypoxic requiring 6 L of O2-stat chest x-ray showed complete whiteout  of the right lung likely related to postobstructive atelectasis versus mucous plug.  Patient also went into fib with RVR with heart rate 170-200s requiring metoprolol IV 2.5 mg x 1.  Lab work resulted as worsening pancytopenia with hemoglobin 6.7, WBC less than 0.1 and platelet less than 5.  Seen by palliative care team and after discussions with patient and family (son and daughter)-comfort care measures initiated. Patient now transferred to sixth floor. Subjective: Patient now on IV morphine drip.  She appears sedated and lethargic but overall comfortable. Daughter and granddaughter at bedside, expressed satisfaction and gratitude with care provided. Objective: Vitals:   04/27/2019 1300 04/08/2019 1431 04/13/2019 1500 04/16/2019 1600  BP:   (!) 78/60   Pulse:   (!) 156   Resp:   20   Temp: 99.1 F (37.3 C)   98.9 F (37.2 C)  TempSrc: Axillary   Axillary  SpO2:  97% 100%   Weight:      Height:        Intake/Output Summary (Last 24 hours) at 2019-04-20 1506 Last data filed at 04-20-2019 1300 Gross per 24 hour  Intake 0 ml  Output   Net 0 ml   Filed Weights   03/12/2019 1542 03/27/2019 1546  Weight: 47.6 kg 47.6 kg    Physical Examination:  General exam: Cachectic female, on comfort measures, appears mildly tachypneic,  Respiratory system: Reduced breath sounds on the right side compared to left Cardiovascular system: S1 & S2 heard, irregular, tachycardic.  Gastrointestinal system: Abdomen is nondistended, soft and nontender. No organomegaly or masses felt. Normal bowel sounds heard. Central nervous system: Alert and oriented x3. No new focal neurological deficits. Extremities: No contractures, edema or joint deformities.    Data Reviewed: I have  personally reviewed following labs and imaging studies  CBC: Recent Labs  Lab 03/13/2019 1452 03/23/2019 1640 04/03/19 0216 04/03/19 1629 04/03/19 1805 04/26/2019 1224  WBC 0.1* 0.1* 0.1*  --   --  0.1*  NEUTROABS  --  0.0*  --   --   --    --   HGB 7.8* 7.7* 7.3* 7.0* 7.4* 6.7*  HCT 22.6* 22.5* 21.9* 20.8* 21.5* 20.0*  MCV 95.0 96.2 98.6  --   --  95.2  PLT 40* 36* 23*  --   --  4*   Basic Metabolic Panel: Recent Labs  Lab 04/01/19 0952 03/26/2019 1452 03/19/2019 1640 04/03/19 0216 04/03/19 1629 04/03/19 1805 04/19/2019 1224  NA 131* 132* 132* 135  --   --  136  K 3.3* 3.5 3.3* 3.2* 3.1* 3.2* 3.0*  CL 91* 95* 95* 104  --   --  103  CO2 27 26 25 22   --   --  23  GLUCOSE 112* 120* 122* 109*  --   --  96  BUN 10 9 11 8   --   --  7*  CREATININE 0.62 0.66 0.60 0.45  --   --  0.44  CALCIUM 9.1 8.7* 8.6* 7.7*  --   --  7.8*  MG  --   --   --  1.3*  --   --   --   PHOS  --   --   --  2.0*  --   --   --    GFR: Estimated Creatinine Clearance: 46.4 mL/min (by C-G formula based on SCr of 0.44 mg/dL). Liver Function Tests: Recent Labs  Lab 03/09/2019 1452 03/29/2019 1640 04/03/19 0216 05/03/2019 1224  AST 12* 13* 12* 21  ALT 11 14 12 12   ALKPHOS 60 53 53 42  BILITOT 0.8 1.1 0.5 0.9  PROT 6.4* 6.8 5.8* 5.2*  ALBUMIN 3.0* 3.2* 2.7* 2.0*   No results for input(s): LIPASE, AMYLASE in the last 168 hours. No results for input(s): AMMONIA in the last 168 hours. Coagulation Profile: Recent Labs  Lab 03/21/2019 1640  INR 1.2   Cardiac Enzymes: No results for input(s): CKTOTAL, CKMB, CKMBINDEX, TROPONINI in the last 168 hours. BNP (last 3 results) No results for input(s): PROBNP in the last 8760 hours. HbA1C: No results for input(s): HGBA1C in the last 72 hours. CBG: No results for input(s): GLUCAP in the last 168 hours. Lipid Profile: No results for input(s): CHOL, HDL, LDLCALC, TRIG, CHOLHDL, LDLDIRECT in the last 72 hours. Thyroid Function Tests: No results for input(s): TSH, T4TOTAL, FREET4, T3FREE, THYROIDAB in the last 72 hours. Anemia Panel: No results for input(s): VITAMINB12, FOLATE, FERRITIN, TIBC, IRON, RETICCTPCT in the last 72 hours. Sepsis Labs: Recent Labs  Lab 04/01/19 2355 04/01/19 1151 03/25/2019 1626  03/14/2019 1640  PROCALCITON  --   --   --  <0.10  LATICACIDVEN 1.1 0.7 1.3 1.1    Recent Results (from the past 240 hour(s))  SARS CORONAVIRUS 2 (TAT 6-24 HRS) Nasopharyngeal Nasopharyngeal Swab     Status: None   Collection Time: 04/01/19  9:38 AM   Specimen: Nasopharyngeal Swab  Result Value Ref Range Status   SARS Coronavirus 2 NEGATIVE NEGATIVE Final    Comment: (NOTE) SARS-CoV-2 target nucleic acids are NOT DETECTED. The SARS-CoV-2 RNA is generally detectable in upper and lower respiratory specimens during the acute phase of infection. Negative results do not preclude SARS-CoV-2 infection, do not rule out co-infections with other pathogens, and should  not be used as the sole basis for treatment or other patient management decisions. Negative results must be combined with clinical observations, patient history, and epidemiological information. The expected result is Negative. Fact Sheet for Patients: SugarRoll.be Fact Sheet for Healthcare Providers: https://www.woods-mathews.com/ This test is not yet approved or cleared by the [place] FDA and  has been authorized for detection and/or diagnosis of SARS-CoV-2 by FDA under an Emergency Use Authorization (EUA). This EUA will remain  in effect (meaning this test can be used) for the duration of the COVID-19 declaration under Section 56 4(b)(1) of the Act, 21 U.S.C. section 360bbb-3(b)(1), unless the authorization is terminated or revoked sooner. Performed at [facility], [place] [address], [place], [place] [address]   Culture, blood (routine x 2)     Status: None   Collection Time: 04/01/19  9:43 AM   Specimen: BLOOD RIGHT WRIST  Result Value Ref Range Status   Specimen Description BLOOD RIGHT WRIST BOTTLES DRAWN AEROBIC ONLY  Final   Special Requests Blood Culture adequate volume  Final   Culture   Final    NO GROWTH 5 DAYS Performed at [facility], [address]  [place], [place] [address]    Report Status 27-Apr-2019 FINAL  Final  Culture, blood (routine x 2)     Status: None   Collection Time: 04/01/19  9:52 AM   Specimen: BLOOD RIGHT ARM  Result Value Ref Range Status   Specimen Description   Final    BLOOD RIGHT ARM BOTTLES DRAWN AEROBIC AND ANAEROBIC   Special Requests Blood Culture adequate volume  Final   Culture   Final    NO GROWTH 5 DAYS Performed at [facility], [address], [place], [place] [address]    Report Status Apr 27, 2019 FINAL  Final  Culture, blood (Routine x 2)     Status: None (Preliminary result)   Collection Time: 04/01/2019  4:40 PM   Specimen: BLOOD RIGHT FOREARM  Result Value Ref Range Status   Specimen Description   Final    BLOOD RIGHT FOREARM Performed at [facility], [place] [address], [place], [place] [address]    Special Requests   Final    BOTTLES DRAWN AEROBIC AND ANAEROBIC Blood Culture adequate volume Performed at [place] [address] [place] [address] [place], [place] [address]    Culture   Final    NO GROWTH 4 DAYS Performed at [facility], [place] [address] [place], [place] [address]    Report Status PENDING  Incomplete  Culture, blood (Routine x 2)     Status: Abnormal   Collection Time: 03/29/2019  4:40 PM   Specimen: BLOOD LEFT FOREARM  Result Value Ref Range Status   Specimen Description   Final    BLOOD LEFT FOREARM Performed at [place] [address], [place], [place] [address]    Special Requests   Final    BOTTLES DRAWN AEROBIC AND ANAEROBIC Blood Culture adequate volume Performed at [facility] [address], [place], [place] [address]    Culture  Setup Time   Final    ANAEROBIC BOTTLE ONLY GRAM POSITIVE COCCI CRITICAL RESULT CALLED TO, READ BACK BY AND VERIFIED WITH: Lavell Luster [facility] 04/24/2019 0021 JDW    Culture (A)  Final    MICROCOCCUS SPECIES Standardized susceptibility testing for this organism is not  available. Performed at [facility], [place] [address], [place], [place] [address]  Report Status 04/05/2019 FINAL  Final  Respiratory Panel by PCR     Status: None   Collection Time: 03/29/2019  8:35 PM   Specimen: Nasopharyngeal Swab; Respiratory  Result Value Ref Range Status   Adenovirus NOT DETECTED NOT DETECTED Final   Coronavirus 229E NOT DETECTED NOT DETECTED Final    Comment: (NOTE) The Coronavirus on the Respiratory Panel, DOES NOT test for the novel  Coronavirus (2019 nCoV)    Coronavirus HKU1 NOT DETECTED NOT DETECTED Final   Coronavirus NL63 NOT DETECTED NOT DETECTED Final   Coronavirus OC43 NOT DETECTED NOT DETECTED Final   Metapneumovirus NOT DETECTED NOT DETECTED Final   Rhinovirus / Enterovirus NOT DETECTED NOT DETECTED Final   Influenza A NOT DETECTED NOT DETECTED Final   Influenza B NOT DETECTED NOT DETECTED Final   Parainfluenza Virus 1 NOT DETECTED NOT DETECTED Final   Parainfluenza Virus 2 NOT DETECTED NOT DETECTED Final   Parainfluenza Virus 3 NOT DETECTED NOT DETECTED Final   Parainfluenza Virus 4 NOT DETECTED NOT DETECTED Final   Respiratory Syncytial Virus NOT DETECTED NOT DETECTED Final   Bordetella pertussis NOT DETECTED NOT DETECTED Final   Chlamydophila pneumoniae NOT DETECTED NOT DETECTED Final   Mycoplasma pneumoniae NOT DETECTED NOT DETECTED Final    Comment: Performed at [facility], [place]. [address] [place], [place] [address]  MRSA PCR Screening     Status: None   Collection Time: 04/03/19 12:22 AM   Specimen: Nasal Mucosa; Nasopharyngeal  Result Value Ref Range Status   MRSA by PCR NEGATIVE NEGATIVE Final    Comment:        The GeneXpert MRSA Assay (FDA approved for NASAL specimens only), is one component of a comprehensive MRSA colonization surveillance program. It is not intended to diagnose MRSA infection nor to guide or monitor treatment for MRSA infections. Performed at [facility], [place] [address] [place], [place] [address]       Radiology Studies: No results found.      Scheduled Meds:  glycopyrrolate  0.4 mg Intravenous Q4H   LORazepam  1 mg Intravenous Q4H   mouth rinse  15 mL Mouth Rinse BID   sodium chloride flush  10-40 mL Intracatheter Q12H   Continuous Infusions:  morphine 1 mg/hr (2019-04-10 1144)    Assessment & Plan:   1. Neutropenic fever with unclear source of sepsis.  Blood culture 1 out of 4 bottles reporting gram-positive cocci-Aerococcus-likely contamination and unrelated to Port-A-Cath.  Off antibiotics 2.  Acute hypoxic respiratory failure: Now on 2 L nasal cannula/neb treatment as needed for comfort.  3.Rapid atrial fibrillation with RVR: Now off monitor with comfort measures. 5.  Severe pancytopenia: in the setting of chemotherapy/radiation/sepsis.  She received blood transfusion on 12/1. Her risk of spontaneous bleeding is extremely high at this time. 6. CAD/ unstable angina: Morphine as needed for comfort  7.Small cell Lung ca : T3, N3, M0- with right upper lobe lung mass in addition to satellite nodule as well as right hilar, mediastinal and right supraclavicular lymphadenopathy diagnosed in September 2020. First cycle on 02/12/2019. Last session on 11/27.  Appreciate oncology evaluation. 8. COPD/Emphysema :Nebs prn 9. Hypokalemia: She has received p.o./IV replacement as needed during the hospital course 10.  Moderate-severe protein calorie malnutrition/with failure to thrive: Due to advanced malignancy.  Now comfort care Code Status: DNR Family / Patient Communication: d/w family including daughter, granddaughter at bedside again today.  Comforted them.  They appeared to be at  peace with decisions made.They appreciate care provided.   Disposition Plan: Appears terminal, may pass away tonight.     LOS: 3 days    Total time spent: 25 minutes   Guilford Shi, MD [facility] Pager [phone]  If 7PM-7AM, please contact  night-coverage [email or web address] Password [email or web address] April 28, 2019, 3:06 PM

## 2019-05-04 DEATH — deceased

## 2019-05-09 ENCOUNTER — Encounter: Payer: Self-pay | Admitting: Physician Assistant

## 2019-06-20 NOTE — Progress Notes (Signed)
  Radiation Oncology         ([phone]) [phone] ________________________________  Name: Stacy [name] MRN: [id number]  Date: 03/30/2019  DOB: [birth year]  End of Treatment Note  Diagnosis:  Lung cancer     Indication for treatment::  curative       Radiation treatment dates:   02/12/19 - 03/16/2019  Site/dose:   The patient was treated to the disease within the right lung initially to a dose of 60 Gy using a 4 field, 3-D conformal technique. The patient then received a cone down boost treatment for an additional 6 Gy. This yielded a final total dose of 66 Gy.   Narrative: The patient tolerated radiation treatment relatively well.   The patient did experience esophagitis during the course of treatment which required management. The patient's status declined towards the end of the course of XRT. She did not complete the final fraction, receiving a total of 64 Gy.  Plan: The patient has completed radiation treatment. The patient will return to radiation oncology clinic for routine followup in one month. I advised the patient to call or return sooner if they have any questions or concerns related to their recovery or treatment. ________________________________  Jodelle Gross, M.D., Ph.D.

## 2020-04-19 IMAGING — DX DG CHEST 1V
1 series · 1 of 1 positions shown · non-contrast
Comparison: 04/02/2019 chest radiograph.

CLINICAL DATA: Fluid excess

EXAM:
CHEST  1 VIEW

[chest ap]
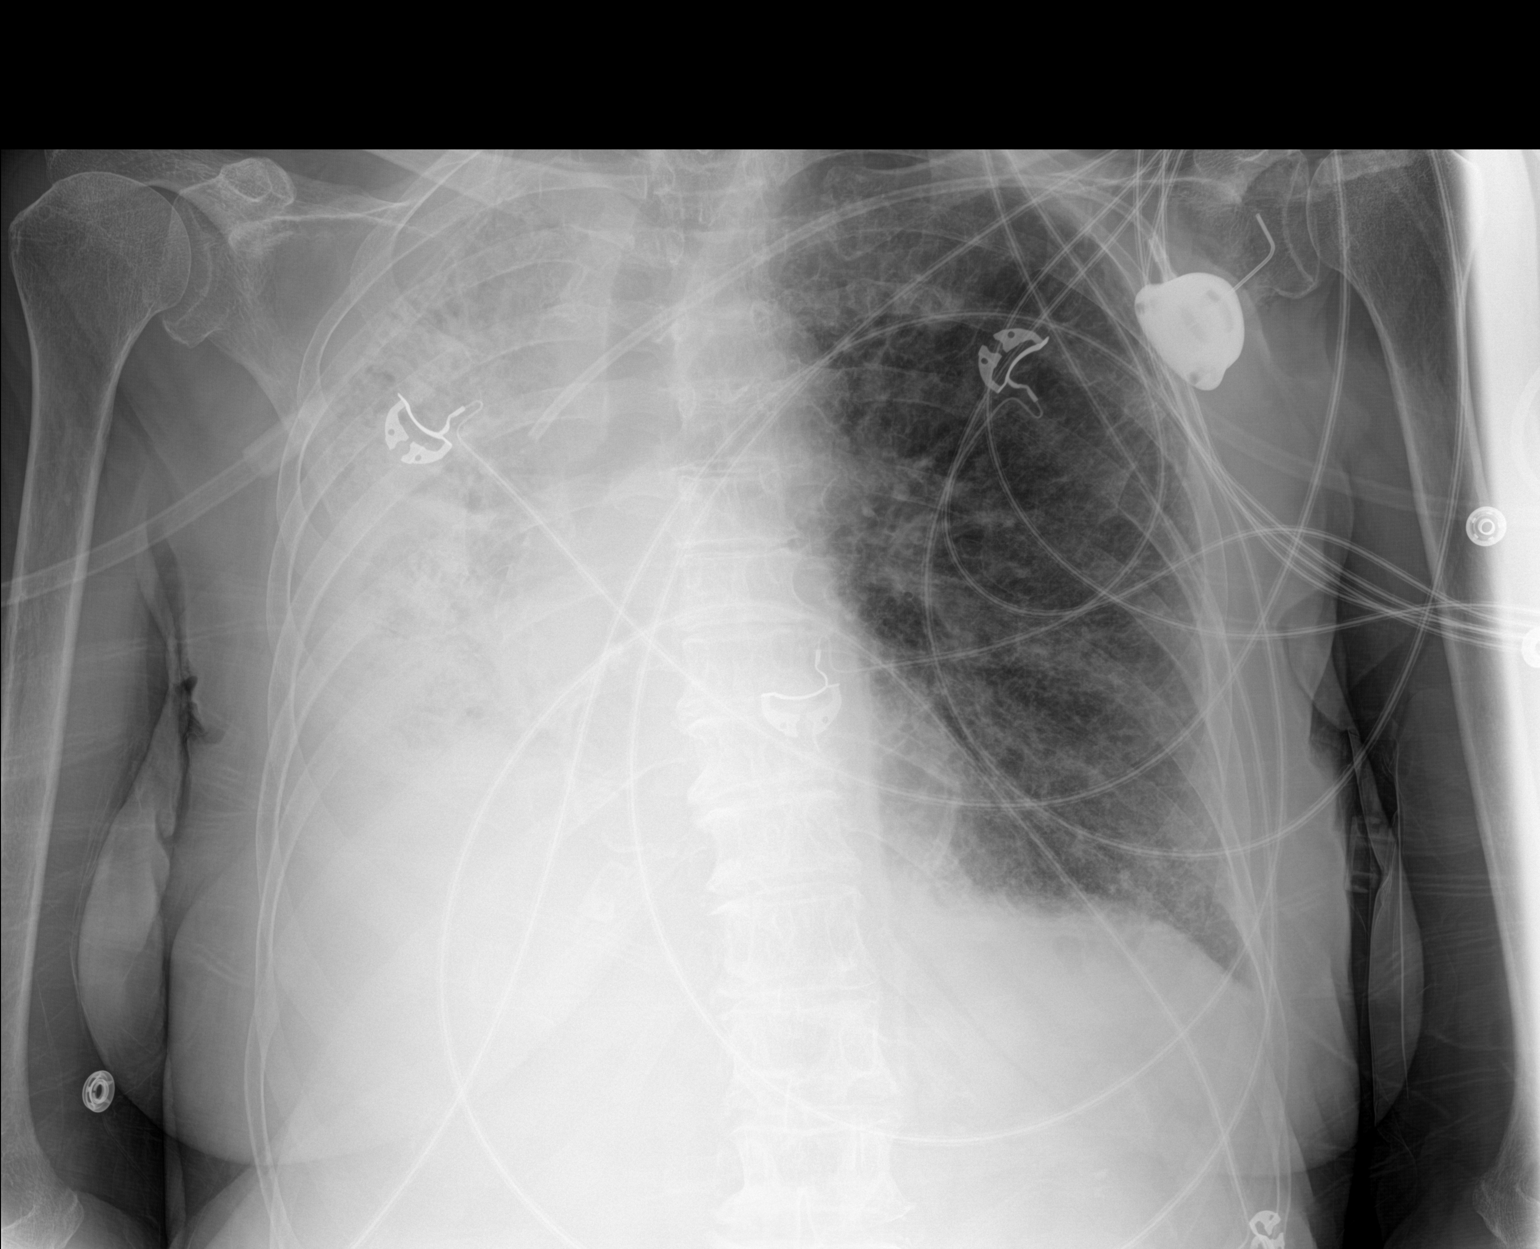

[1 of 1 positions shown; findings below may reference images not displayed]

FINDINGS: Left subclavian Port-A-Cath terminates in the upper third of the
SVC. Stable cardiomediastinal silhouette with normal heart size. No
pneumothorax. No left pleural effusion. Near complete opacification
of the right hemithorax, new. Volume loss in the right hemithorax,
new. Hazy reticular opacities in the lower left lung, worsened.
IMPRESSION: 1. New near complete right hemithorax opacification with volume
loss. This rapid interval change suggests mucous impaction in the
central right airways. Chest radiograph follow-up and/or
bronchoscopic evaluation suggested.
2. Worsened hazy reticular opacities at the left lung base, favor
atelectasis, cannot exclude a component of aspiration.
